# Patient Record
Sex: Female | Born: 1965
Health system: Southern US, Community
[De-identification: ages and names within clinical notes are randomized; demographics above are authoritative.]

## PROBLEM LIST (undated history)

## (undated) DIAGNOSIS — M1990 Unspecified osteoarthritis, unspecified site: Secondary | ICD-10-CM

## (undated) DIAGNOSIS — R011 Cardiac murmur, unspecified: Secondary | ICD-10-CM

## (undated) DIAGNOSIS — J45909 Unspecified asthma, uncomplicated: Secondary | ICD-10-CM

## (undated) DIAGNOSIS — D649 Anemia, unspecified: Secondary | ICD-10-CM

## (undated) DIAGNOSIS — F419 Anxiety disorder, unspecified: Secondary | ICD-10-CM

## (undated) DIAGNOSIS — IMO0001 Reserved for inherently not codable concepts without codable children: Secondary | ICD-10-CM

## (undated) DIAGNOSIS — I10 Essential (primary) hypertension: Secondary | ICD-10-CM

## (undated) DIAGNOSIS — O009 Unspecified ectopic pregnancy without intrauterine pregnancy: Secondary | ICD-10-CM

## (undated) DIAGNOSIS — D259 Leiomyoma of uterus, unspecified: Secondary | ICD-10-CM

## (undated) DIAGNOSIS — J189 Pneumonia, unspecified organism: Secondary | ICD-10-CM

## (undated) HISTORY — DX: Unspecified ectopic pregnancy without intrauterine pregnancy: O00.90

## (undated) HISTORY — DX: Cardiac murmur, unspecified: R01.1

## (undated) HISTORY — DX: Unspecified asthma, uncomplicated: J45.909

## (undated) HISTORY — PX: OOPHORECTOMY: SHX86

## (undated) HISTORY — PX: TUBAL LIGATION: SHX77

---

## 2009-06-05 ENCOUNTER — Emergency Department (HOSPITAL_COMMUNITY): Admission: EM | Admit: 2009-06-05 | Discharge: 2009-06-05 | Payer: Self-pay | Admitting: Emergency Medicine

## 2014-03-15 ENCOUNTER — Ambulatory Visit: Payer: Self-pay | Admitting: Gynecology

## 2014-03-25 ENCOUNTER — Emergency Department (HOSPITAL_COMMUNITY)
Admission: EM | Admit: 2014-03-25 | Discharge: 2014-03-26 | Disposition: A | Discharge status: Home / Self Care | Payer: 59 | Attending: Emergency Medicine | Admitting: Emergency Medicine

## 2014-03-25 ENCOUNTER — Encounter (HOSPITAL_COMMUNITY): Payer: Self-pay | Admitting: Emergency Medicine

## 2014-03-25 ENCOUNTER — Emergency Department (HOSPITAL_COMMUNITY): Payer: 59

## 2014-03-25 DIAGNOSIS — J209 Acute bronchitis, unspecified: Secondary | ICD-10-CM | POA: Insufficient documentation

## 2014-03-25 DIAGNOSIS — R112 Nausea with vomiting, unspecified: Secondary | ICD-10-CM | POA: Insufficient documentation

## 2014-03-25 DIAGNOSIS — Z8701 Personal history of pneumonia (recurrent): Secondary | ICD-10-CM | POA: Insufficient documentation

## 2014-03-25 DIAGNOSIS — J40 Bronchitis, not specified as acute or chronic: Secondary | ICD-10-CM

## 2014-03-25 LAB — CBC WITH DIFFERENTIAL/PLATELET
Basophils Absolute: 0 10*3/uL (ref 0.0–0.1)
Basophils Relative: 0 % (ref 0–1)
Eosinophils Absolute: 0.1 10*3/uL (ref 0.0–0.7)
Eosinophils Relative: 1 % (ref 0–5)
HCT: 35.8 % — ABNORMAL LOW (ref 36.0–46.0)
Hemoglobin: 12 g/dL (ref 12.0–15.0)
LYMPHS ABS: 2.5 10*3/uL (ref 0.7–4.0)
Lymphocytes Relative: 27 % (ref 12–46)
MCH: 26.8 pg (ref 26.0–34.0)
MCHC: 33.5 g/dL (ref 30.0–36.0)
MCV: 80.1 fL (ref 78.0–100.0)
Monocytes Absolute: 0.9 10*3/uL (ref 0.1–1.0)
Monocytes Relative: 9 % (ref 3–12)
NEUTROS ABS: 5.7 10*3/uL (ref 1.7–7.7)
NEUTROS PCT: 63 % (ref 43–77)
Platelets: 214 10*3/uL (ref 150–400)
RBC: 4.47 MIL/uL (ref 3.87–5.11)
RDW: 15 % (ref 11.5–15.5)
WBC: 9.2 10*3/uL (ref 4.0–10.5)

## 2014-03-25 LAB — COMPREHENSIVE METABOLIC PANEL
ALBUMIN: 3.9 g/dL (ref 3.5–5.2)
ALK PHOS: 87 U/L (ref 39–117)
ALT: 31 U/L (ref 0–35)
AST: 29 U/L (ref 0–37)
BUN: 10 mg/dL (ref 6–23)
CO2: 24 mEq/L (ref 19–32)
Calcium: 9 mg/dL (ref 8.4–10.5)
Chloride: 104 mEq/L (ref 96–112)
Creatinine, Ser: 0.63 mg/dL (ref 0.50–1.10)
GFR calc Af Amer: >90 mL/min (ref >90)
GFR calc non Af Amer: >90 mL/min (ref >90)
GLUCOSE: 101 mg/dL — AB (ref 70–99)
POTASSIUM: 3.5 meq/L — AB (ref 3.7–5.3)
SODIUM: 141 meq/L (ref 137–147)
Total Bilirubin: <0.2 mg/dL — ABNORMAL LOW (ref 0.3–1.2)
Total Protein: 7.9 g/dL (ref 6.0–8.3)

## 2014-03-25 LAB — I-STAT TROPONIN, ED: Troponin i, poc: 0.08 ng/mL (ref 0.00–0.08)

## 2014-03-25 MED ORDER — IPRATROPIUM-ALBUTEROL 0.5-2.5 (3) MG/3ML IN SOLN
3.0000 mL | Freq: Once | RESPIRATORY_TRACT | Status: AC
  Administered 2014-03-25: 3 mL via RESPIRATORY_TRACT
  Filled 2014-03-25: qty 3

## 2014-03-25 NOTE — ED Provider Notes (Signed)
CSN: [id number]     Arrival date & time 03/25/14  1835 History   First MD Initiated Contact with Patient 03/25/14 2301     Chief Complaint  Patient presents with  . Shortness of Breath  . Chest Pain     (Consider location/radiation/quality/duration/timing/severity/associated sxs/prior Treatment) Patient is a 48 y.o. female presenting with shortness of breath and chest pain. The history is provided by the patient.  Shortness of Breath Associated symptoms: chest pain   Chest Pain Associated symptoms: shortness of breath   She has been sick for about the last 2 weeks. She saw a physician was diagnosed with pneumonia and sinusitis and was placed on amoxicillin. She completed 10 days of amoxicillin but is not feeling any better. She continues to have a cough productive of white sputum. There is nasal congestion eye and then rhinorrhea is occasionally streaked with blood. She denies fever, chills, sweats. She does have chest pain which is worse when she breathes she rates the pain at 6/10. There is some difficulty breathing. She has had mild nausea and posttussive emesis. She denies arthralgias or myalgias.  History reviewed. No pertinent past medical history. History reviewed. No pertinent past surgical history. History reviewed. No pertinent family history. History  Substance Use Topics  . Smoking status: Never Smoker   . Smokeless tobacco: Not on file  . Alcohol Use: No   OB History   Grav Para Term Preterm Abortions TAB SAB Ect Mult Living                 Review of Systems  Respiratory: Positive for shortness of breath.   Cardiovascular: Positive for chest pain.  All other systems reviewed and are negative.     Allergies  Review of patient's allergies indicates no known allergies.  Home Medications   Prior to Admission medications   Not on File   BP 151/97  Pulse 90  Temp(Src) 98.1 F (36.7 C) (Oral)  Resp 18  SpO2 100%  LMP 02/25/2014 Physical Exam  Nursing note  and vitals reviewed.  48 year old female, resting comfortably and in no acute distress. Vital signs are significant for hypertension with blood pressure 151/97. Oxygen saturation is 100%, which is normal. Head is normocephalic and atraumatic. PERRLA, EOMI. Oropharynx is clear. Neck is nontender and supple without adenopathy or JVD. Back is nontender and there is no CVA tenderness. Lungs have prolonged exhalation phase with a few scattered wheezes. There are no rales or rhonchi. Chest is nontender. Heart has regular rate and rhythm without murmur. Abdomen is soft, flat, nontender without masses or hepatosplenomegaly and peristalsis is normoactive. Extremities have no cyanosis or edema, full range of motion is present. Skin is warm and dry without rash. Neurologic: Mental status is normal, cranial nerves are intact, there are no motor or sensory deficits.  ED Course  Procedures (including critical care time) Labs Review Results for orders placed during the hospital encounter of 03/25/14  CBC WITH DIFFERENTIAL      Result Value Ref Range   WBC 9.2  4.0 - 10.5 K/uL   RBC 4.47  3.87 - 5.11 MIL/uL   Hemoglobin 12.0  12.0 - 15.0 g/dL   HCT 35.8 (*) 36.0 - 46.0 %   MCV 80.1  78.0 - 100.0 fL   MCH 26.8  26.0 - 34.0 pg   MCHC 33.5  30.0 - 36.0 g/dL   RDW 15.0  11.5 - 15.5 %   Platelets 214  150 - 400 K/uL  Neutrophils Relative % 63  43 - 77 %   Neutro Abs 5.7  1.7 - 7.7 K/uL   Lymphocytes Relative 27  12 - 46 %   Lymphs Abs 2.5  0.7 - 4.0 K/uL   Monocytes Relative 9  3 - 12 %   Monocytes Absolute 0.9  0.1 - 1.0 K/uL   Eosinophils Relative 1  0 - 5 %   Eosinophils Absolute 0.1  0.0 - 0.7 K/uL   Basophils Relative 0  0 - 1 %   Basophils Absolute 0.0  0.0 - 0.1 K/uL  COMPREHENSIVE METABOLIC PANEL      Result Value Ref Range   Sodium 141  137 - 147 mEq/L   Potassium 3.5 (*) 3.7 - 5.3 mEq/L   Chloride 104  96 - 112 mEq/L   CO2 24  19 - 32 mEq/L   Glucose, Bld 101 (*) 70 - 99 mg/dL    BUN 10  6 - 23 mg/dL   Creatinine, Ser 0.63  0.50 - 1.10 mg/dL   Calcium 9.0  8.4 - 10.5 mg/dL   Total Protein 7.9  6.0 - 8.3 g/dL   Albumin 3.9  3.5 - 5.2 g/dL   AST 29  0 - 37 U/L   ALT 31  0 - 35 U/L   Alkaline Phosphatase 87  39 - 117 U/L   Total Bilirubin <0.2 (*) 0.3 - 1.2 mg/dL   GFR calc non Af Amer >90  >90 mL/min   GFR calc Af Amer >90  >90 mL/min  I-STAT TROPOININ, ED      Result Value Ref Range   Troponin i, poc 0.08  0.00 - 0.08 ng/mL   Comment 3            Imaging Review No results found. Images viewed by me.   EKG Interpretation   Date/Time:  Monday Mar 25 2014 22:34:06 EDT Ventricular Rate:  92 PR Interval:  166 QRS Duration: 84 QT Interval:  377 QTC Calculation: 466 R Axis:   77 Text Interpretation:  Sinus rhythm Probable left ventricular hypertrophy  No old tracing to compare Confirmed by [facility]  MD, Ferman Basilio ([id number]) on  03/25/2014 11:09:57 PM      MDM   Final diagnoses:  Bronchitis    Respiratory tract infection which has failed to respond to amoxicillin. Chest x-ray does appear to show a localized infiltrate in the retrocardiac area. She clearly has some bronchospasm and will be given albuterol with ipratropium.  She feels much better after above noted treatment. On reexam, lungs are completely clear. She is discharged with prescriptions for prednisone, levofloxacin, and albuterol inhaler. Blood pressures to be rechecked as an outpatient in the next several days and she is to followup with her PCP in one week.  Delora Fuel, MD 65/46/50 [id number]

## 2014-03-25 NOTE — ED Notes (Signed)
Pt reports that she was recently dx with PNA and sinus infection. States that she was placed on medication but is still feeling bad. Reports increased sinus pressure. States that she is having trouble at night sleeping due to the SOB.

## 2014-03-26 MED ORDER — PREDNISONE 50 MG PO TABS
50.0000 mg | ORAL_TABLET | Freq: Every day | ORAL | Status: DC
Start: 2014-03-26 — End: 2015-05-09

## 2014-03-26 MED ORDER — PREDNISONE 20 MG PO TABS
60.0000 mg | ORAL_TABLET | Freq: Once | ORAL | Status: AC
  Administered 2014-03-26: 60 mg via ORAL
  Filled 2014-03-26: qty 3

## 2014-03-26 MED ORDER — LEVOFLOXACIN 500 MG PO TABS
500.0000 mg | ORAL_TABLET | Freq: Every day | ORAL | Status: DC
Start: 2014-03-26 — End: 2015-05-09

## 2014-03-26 MED ORDER — ALBUTEROL SULFATE HFA 108 (90 BASE) MCG/ACT IN AERS: 2.0000 | INHALATION_SPRAY | RESPIRATORY_TRACT | Status: DC | PRN

## 2014-03-26 NOTE — ED Notes (Signed)
Pt ambulated at discharge with slow steady gait, no acute distress, verbalizing no complaints at this time. Pt and her husband report they understand the discharge instructions.

## 2014-03-26 NOTE — Discharge Instructions (Signed)
Su presin arterial era alta hoy. Usted necesita tener lo [employer] .   Bronquitis (Bronchitis) La bronquitis es una inflamacin de las vas respiratorias que se extienden desde la trquea [facility] pulmones (bronquios). A menudo, la inflamacin produce la formacin de mucosidad, lo que genera tos. Si la inflamacin es grave, puede provocar falta de aire. CAUSAS  Las causas de la bronquitis pueden ser:   Infecciones virales.  Bacterias.  Humo del cigarrillo.  Alrgenos, contaminantes y otros irritantes. [place] sntoma ms habitual de la bronquitis es la tos frecuente con mucosidad. Otros sntomas son:  Cristy Hilts.  Dolores [employer] cuerpo.  Congestin en el pecho.  Escalofros.  Falta de aire.  Dolor de Investment banker, operational. DIAGNSTICO  La bronquitis en general se diagnostica con la historia clnica y un examen fsico. En algunos casos se indican otros estudios, como radiografas, para Clinical research associate.  TRATAMIENTO  Tal vez deba evitar el contacto con la causa del problema (por ejemplo, el cigarrillo). En algunos casos es Surveyor, quantity. Estos pueden ser:  Antibiticos. Tal vez se los receten si la causa de la bronquitis es una bacteria.  Antitusivos. Tal vez se los receten para [employer] sntomas de la tos.  Medicamentos inhalados. Tal vez se los receten para Financial controller las vas respiratorias y Museum/gallery exhibitions officer respiracin.  Medicamentos con corticoides. Tal vez se los receten si tiene bronquitis recurrente (crnica). INSTRUCCIONES PARA EL CUIDADO EN EL HOGAR  Descanse lo suficiente.  Beba lquidos en abundancia para mantener la orina de color claro o amarillo plido (excepto que padezca una enfermedad que requiera la restriccin de lquidos). Tome mucho lquido para Radiation protection practitioner las secreciones y Tree surgeon.  Tome solo medicamentos de venta libre o recetados, segn las [place] los  antibiticos exclusivamente segn las indicaciones. Finalice la prescripcin completa, aunque se sienta mejor.  Evite el humo de segunda mano, los qumicos irritantes y los vapores fuertes. Estos agentes empeoran la bronquitis. Si es fumador, abandone el hbito. Considere el uso de goma de Higher education careers adviser o la aplicacin de parches en la piel que contengan nicotina para aliviar los sntomas de abstinencia. Si deja de fumar, sus pulmones se curarn ms rpido.  Ponga un humidificador de vapor fro en la habitacin por la noche para humedecer el aire. Puede ayudarlo a aflojar la mucosidad. Cambie el agua del humidificador a diario. Tambin puede abrir el agua caliente de la ducha y sentarse en el bao con la puerta cerrada durante 5a67minutos.  Concurra a las consultas de control con su mdico segn las indicaciones.  Lvese las manos con frecuencia para evitar contagiarse bronquitis nuevamente y para no extender la infeccin a Producer, television/film/video. SOLICITE ATENCIN MDICA SI: Los sntomas no mejoran despus de 1 semana de tratamiento.  SOLICITE ATENCIN MDICA DE INMEDIATO SI:  La fiebre aumenta.  Tiene escalofros.  Siente dolor en el pecho.  Le empeora la falta el aire.  La flema tiene [place].  Se desmaya.  Tiene vahdos.  Sufre un dolor intenso de [place].  Vomita repetidas veces. ASEGRESE DE QUE:   Comprende estas instrucciones.  Controlar su afeccin.  Recibir ayuda de inmediato si no mejora o si empeora. Document Released: 10/25/2005 Document Revised: 08/15/2013 [facility] Patient Information 2014 [place], [place].  Albuterol inhalation aerosol Qu es este medicamento? El ALBUTEROL es un broncodilatador. Ayuda a abrir las vas areas a los pulmones y Field seismologist respirar ms fcilmente. Este medicamento es utilizado para tratar  y prevenir el broncoespasmo. Este medicamento puede ser utilizado para otros usos; si tiene alguna pregunta consulte con su proveedor de atencin mdica o  con su farmacutico. MARCAS COMERCIALES DISPONIBLES: Proair HFA, Proventil HFA, Proventil, Respirol , Ventolin HFA, Ventolin Qu le debo informar a mi profesional de la salud antes de tomar este medicamento? Necesita saber si usted presenta alguno de los siguientes problemas o situaciones: -diabetes -enfermedad cardiaca o pulso cardiaco irregular -alta presin sangunea -feocromocitoma -convulsiones -enfermedad tiroidea -una reaccin alrgica o inusual al albuterol, al levalbuterol, a los sulfitos, a otros medicamentos, alimentos, colorantes o conservadores -si est embarazada o buscando quedar embarazada -si est amamantando a un beb Cmo debo utilizar este medicamento? Este medicamento es para inhalacin por va oral. Siga las instrucciones de la etiqueta del [place]. Utilcelo a intervalos regulares. No utilice su medicamento con una frecuencia mayor a la indicada. Asegrese de que est utilizando su [place]. Si tiene [employer], comunquese con su mdico o su proveedor de Geophysical data processor. Hable con su pediatra para informarse acerca del uso de este medicamento en nios. Puede requerir atencin especial. Sobredosis: Pngase en contacto inmediatamente con un centro toxicolgico o una sala de urgencia si usted cree que haya tomado demasiado medicamento. ATENCIN: [employer] es solo para usted. No comparta este medicamento con nadie. Qu sucede si me olvido de una dosis? Si olvida una dosis, sela lo antes posible. Si es casi la hora de su dosis siguiente, aplique slo esa dosis. No use dosis dobles o adicionales. Qu puede interactuar con este medicamento? -antiinfecciosos como cloroquina y pentamidina -cafena -cisapride -diurticos -medicamentos para resfros -medicamentos para tratar la depresin o trastornos psicticos o emocionales -medicamentos para bajar de peso incluyendo algunos productos a base de hierbas -metadona -ciertos antibiticos  Estate manager/land agent, eritromicina, levofloxacino y linezolid -ciertos medicamentos cardiacos -hormonas esteroideas, tales como dexametasona, cortisona, hidrocortisona -teofilina -hormonas tiroideas Puede ser que esta lista no menciona todas las posibles interacciones. Informe a su profesional de KB Home	[place] de [employer] productos a base de hierbas, medicamentos de [place] o suplementos nutritivos que est tomando. Si usted fuma, consume bebidas alcohlicas o si utiliza drogas ilegales, indqueselo tambin a su profesional de KB Home	[place]. Algunas sustancias pueden interactuar con su medicamento. A qu debo estar atento al usar Coca-Cola? Informe a su mdico o a su profesional de la salud si sus sntomas no mejoran. No utilice albuterol adicional. Si su asma o bronquitis empeora mientras est recibiendo Coca-Cola, comunquese inmediatamente con su mdico. Si el medicamento le seca la boca trate de Engineer, manufacturing systems chicle sin azcar o chupar caramelos duros. Bebe agua como le haya indicado. Qu efectos secundarios puedo tener al [employer] este medicamento? Efectos secundarios que debe informar a su mdico o a Barrister's clerk de la salud tan pronto como sea posible: -Chief of Staff como erupcin cutnea, picazn o urticarias, hinchazn de la cara, labios o lengua -problemas respiratorios -dolor en el pecho -sensacin de desmayos o mareos, cadas -alta presin sangunea -pulso cardiaco irregular -fiebre -calambres o debilidad muscular -dolor, hormigueo, entumecimiento de las manos o pies -vmito Efectos secundarios que, por lo general, no requieren atencin mdica (debe informarlos a su mdico o a su profesional de la salud si persisten o si son molestos): -tos -dificultad para conciliar el sueo -dolor de cabeza -nerviosismo, temblores -Higher education careers adviser -congestin nasal o goteo de la Lawyer -irrtacin de garganta -sabor inusual Puede ser que esta lista no menciona todos los  posibles efectos secundarios. Comunquese a su mdico por  asesoramiento mdico [employer]. Usted puede informar los efectos secundarios a la FDA por telfono al 1-800-FDA-1088. Dnde debo guardar mi medicina? Mantngala fuera del alcance de los nios. Guarde a [employer] 15 y 50 grados C (59 y 32 grados F). El contenido se encuentra bajo presin y puede explotar si lo expone al calor o al fuego. No lo congele. El fro [employer] efectividad del [place]. Deseche todo el medicamento que no haya utilizado, despus de la fecha de vencimiento. Debe desechar los inhaladores despus de que se han usado la cantidad de bocanadas indicado en la etiqueta o por la fecha de vencimiento; el que llegue primero. Debe desechar Ventolin HFA despus de 12 meses de sacar de la bolsa de aluminio. Revise las instrucciones que vienen con su medicamento. ATENCIN: Este folleto es un resumen. Puede ser que no cubra toda la posible informacin. Si usted tiene preguntas acerca de esta medicina, consulte con su mdico, su farmacutico o su profesional de Technical sales engineer.  2014, Elsevier/Gold Standard. (2013-05-22 15:41:57)  Levofloxacin tablets Qu es este medicamento? El LEVOFLOXACINO es un antibitico quinolnico. Se utiliza en el tratamiento de ciertos tipos de infecciones bacterianas. No es efectivo para resfros, gripe u otras infecciones de origen viral. Este medicamento puede ser utilizado para otros usos; si tiene alguna pregunta consulte con su proveedor de atencin mdica o con su farmacutico. MARCAS COMERCIALES DISPONIBLES: Levaquin Leva-Pak, Levaquin Qu le debo informar a mi profesional de la salud antes de tomar este medicamento? Necesita saber si usted presenta alguno de los siguientes problemas o situaciones: -enfermedad cerebral -latido cardaco irregular -enfermedad renal -trastorno de convulsiones -una reaccin alrgica o inusual al levofloxacino, a los antibiticos, a  otros medicamentos, alimentos, colorantes o conservantes -si est embarazada o buscando quedar embarazada -si est amamantando a un beb Cmo debo utilizar este medicamento? Tome este medicamento por va oral con un vaso lleno de agua. Siga las instrucciones de la etiqueta del [place]. Este medicamento se puede tomar con o sin alimentos. Tome sus dosis a intervalos regulares. No tome su medicamento con una frecuencia mayor a la indicada. No omita ninguna dosis o suspenda el uso de su medicamento antes de lo indicado aun si se siente mejor. No deje de tomarlo excepto si as lo indica su mdico. Su farmacutico le dar una [place] del medicamento especial con cada receta y relleno. Asegrese de leer esta informacin cada vez cuidadosamente. Hable con su pediatra para informarse acerca del uso de este medicamento en nios. Aunque este medicamento ha sido recetado a nios tan menores como de 6 meses de edad para condiciones selectivas, las precauciones se aplican. Sobredosis: Pngase en contacto inmediatamente con un centro toxicolgico o una sala de urgencia si usted cree que haya tomado demasiado medicamento. ATENCIN: [employer] es solo para usted. No comparta este medicamento con nadie. Qu sucede si me olvido de una dosis? Si olvida una dosis, tmela en cuanto se acuerde. Si es casi la hora de la prxima dosis, tome slo esa dosis. No tome dosis adicionales o dobles. Qu puede interactuar con este medicamento? No tome esta medicina con ninguno de los siguientes medicamentos: -trixido de arsnico -cloroquina -droperidol -medicamentos para el ritmo cardiaco irregular, tales como amiodarona, disopiramida, dofetilida, flecainida, quinidina, procainamida, sotalol -ciertos medicamentos para la depresin o los problemas mentales, tales como fenotiazina, pimozida o ziprasidona Esta medicina tambin puede interactuar con los siguientes  medicamentos: -amoxapina -anticidos -cisapride -productos lcteos -polvo o tabletas tamponadas de didanosina (ddI) -haloperidol -multivitaminas -los  AINE, medicamentos para el dolor o inflamacin, como ibuprofeno o naproxeno -productos retinoides, tales como tretinona o isotretinona -risperidona -otros antibiticos, tales como claritromicina o eritromicina -sucralfato -teofilina -warfarina Puede ser que esta lista no menciona todas las posibles interacciones. Informe a su profesional de KB Home	[place] de [employer] productos a base de hierbas, medicamentos de [place] o suplementos nutritivos que est tomando. Si usted fuma, consume bebidas alcohlicas o si utiliza drogas ilegales, indqueselo tambin a su profesional de KB Home	[place]. Algunas sustancias pueden interactuar con su medicamento. A qu debo estar atento al usar Coca-Cola? Consulte a su mdico o a su profesional de la salud si sus sntomas no mejoran o si empeoran. Beba varios vasos de agua por da y Tax adviser consumo de bebidas que contengan cafena. No debe deshidratarse mientras est tomando este medicamento. Puede experimentar somnolencia o mareos. No conduzca ni utilice maquinaria, ni haga nada que Associate Professor en estado de alerta hasta que sepa cmo le afecta este medicamento. No se siente ni se ponga de pie con rapidez, especialmente si es un paciente de edad avanzada. Esto reduce el riesgo de mareos o [employer]. Este medicamento puede aumentar la sensibilidad al sol. Mantngase fuera de Administrator. Si no lo puede evitar, utilice ropa protectora y crema de Photographer. No utilice lmparas solares, camas solares ni cabinas solares. Comunquese con su mdico si recibe una quemadura de sol. Si es diabtico, controle atentamente el nivel de Dispensing optician. Si tiene una reaccin inusual, deje de tomar este medicamento y consulte a su mdico inmediatamente. No trate la diarrea con productos de [employer].  Comunquese con su mdico si tiene diarrea que dura ms de 2 das o si es severa y [place]. Evite las preparaciones de anticido, de calcio, de hierro o de cinc por lo menos 2 horas antes o 2 horas despus de Systems developer. Qu efectos secundarios puedo tener al [employer] este medicamento? Efectos secundarios que debe informar a su mdico o a Barrister's clerk de la salud tan pronto como sea posible: -Chief of Staff como erupcin cutnea o urticarias, hinchazn de la cara, labios o lengua -cambios en la visin -confusin, pesadillas o alucinaciones -dificultad al respirar -pulso cardiaco irregular, dolor en el pecho -dolores musculares, articulares o de tendones -dolor o dificultad para orinar -dolor de cabeza persistente con o sin visin borrosa -enrojecimiento, formacin de ampollas, descamacin o aflojamiento de la piel, inclusive dentro de la boca -convulsiones -dolor, entumecimiento, hormigueo o debilidad inusual -irritacin o flujo vaginal Efectos secundarios que, por lo general, no requieren atencin mdica (debe informarlos a su mdico o a su profesional de la salud si persisten o si son molestos): -diarrea -boca seca -dolor de cabeza -nuseas, malestar estomacal -dificultad para conciliar el sueo Puede ser que esta lista no menciona todos los posibles efectos secundarios. Comunquese a su mdico por asesoramiento mdico [employer]. Usted puede informar los efectos secundarios a la FDA por telfono al 1-800-FDA-1088. Dnde debo guardar mi medicina? Mantngala fuera del alcance de los nios. Gurdela a [employer], entre 15 y 80 grados C (58 y 58 grados F). Mantngala en un envase bien cerrado. Deseche todo el medicamento que no haya utilizado, despus de la fecha de vencimiento. ATENCIN: Este folleto es un resumen. Puede ser que no cubra toda la posible informacin. Si usted tiene preguntas acerca de esta medicina, consulte con su mdico, su  farmacutico o su profesional de Technical sales engineer.  2014, Elsevier/Gold Standard. (2011-12-29  16:07:39)  Prednisone tablets Qu es este medicamento? La PREDNISONA es un corticosteroide. Se utiliza para tratar la inflamacin de la piel, articulaciones, pulmones y otros rganos. Condiciones comunes tratadas incluyen asma, alergias y artritis. Tambin se [place] para [employer], tales como trastornos sanguneos o enfermedades de la glndula suprarrenal. Este medicamento puede ser utilizado para otros usos; si tiene alguna pregunta consulte con su proveedor de atencin mdica o con su farmacutico. MARCAS COMERCIALES DISPONIBLES: Deltasone, Predone, Sterapred DS, Sterapred Qu le debo informar a mi profesional de la salud antes de tomar este medicamento? Necesita saber si usted presenta alguno de los siguientes problemas o situaciones: -Sndrome de [place] -diabetes -glaucoma -enfermedad cardiaca -alta presin sangunea -infeccin (especialmente infecciones virales, como varicela o herpes) -enfermedad renal -enfermedad heptica -problemas mentales -miastenia gravis -osteoporosis -convulsiones -problemas estomacales o intestinales -enfermedad tiroidea -una reaccin alrgica o inusual a la lactosa, a la prednisona, a otros medicamentos, alimentos, colorantes o conservantes -si est embarazada o buscando quedar embarazada -si est amamantando a un beb Cmo debo utilizar este medicamento? Tome este medicamento por va oral con un vaso de agua. Siga las instrucciones de la etiqueta del [place]. Tome este medicamento con alimentos. Si slo toma este medicamento una vez al da, tmelo por la [place]. No tome ms medicamento que lo indicado. No deje de tomar este medicamento de manera abrupta debido a que podr Engineer, materials reaccin severa. Su mdico le indicar la cantidad de Event organiser. Si su mdico desea que [employer], la dosis ser reducida gradualmente  para Research officer, political party secundarios. Hable con su pediatra para informarse acerca del uso de este medicamento en nios. Puede requerir atencin especial. Sobredosis: Pngase en contacto inmediatamente con un centro toxicolgico o una sala de urgencia si usted cree que haya tomado demasiado medicamento. ATENCIN: [employer] es solo para usted. No comparta este medicamento con nadie. Qu sucede si me olvido de una dosis? Si olvida una dosis, tmela tan pronto como sea posible. Si es casi la hora de su dosis siguiente, consulte a su mdico o a su profesional de Technical sales engineer. Usted puede necesitar saltar una dosis o tomar una dosis adicional. No tome dosis dobles o adicionales sin asesoramiento. Qu puede interactuar con este medicamento? No tome esta medicina con ninguno de los siguientes medicamentos: -metirapona -mifepristona Esta medicina tambin puede interactuar con los siguientes medicamentos: -aminoglutetimida -anfotericina B -aspirina o medicamentos tipo aspirina -barbitricos -ciertos medicamentos para la diabetes, tales como glipizida o gliburida -colestiramina -inhibidores de colinesterasa -ciclosporina -digoxina -diurticos -efedrina -hormonas femeninas, como estrgenos, progestinas o pldoras anticonceptivas -isoniazida -quetoconazol -los [employer], medicamentos para el dolor o inflamacin, como ibuprofeno o naproxeno -fenitona -rifampicina -toxoide -vacunas -warfarina Puede ser que esta lista no menciona todas las posibles interacciones. Informe a su profesional de KB Home	[place] de [employer] productos a base de hierbas, medicamentos de [place] o suplementos nutritivos que est tomando. Si usted fuma, consume bebidas alcohlicas o si utiliza drogas ilegales, indqueselo tambin a su profesional de KB Home	[place]. Algunas sustancias pueden interactuar con su medicamento. A qu debo estar atento al usar Coca-Cola? Visite a su mdico o a su profesional de la salud para chequear  su evolucin peridicamente. Si toma este medicamento durante un perodo prolongado, lleve consigo una tarjeta de identificacin con su nombre y direccin, el tipo y la dosis del [place], y Academic librarian y la direccin de su mdico. [place] medicamento puede aumentar su riesgo de contraer una infeccin. Informe a su mdico o a su  profesional de la salud si est en contacto con personas con sarampin o varicela, o si desarrolla llagas o ampollas que no se curan bien. Si va a someterse a una operacin, informe a su mdico o a su profesional de la salud que ha tomado este [employer] ltimos doce meses. Consulte a su mdico o a su profesional de la salud acerca de su dieta. Tal vez tendr que reducir la cantidad de sal que consume. Este medicamento puede afectar su nivel de Dispensing optician. Si tiene diabetes, consulte a su mdico o a su profesional de la salud antes de cambiar su dieta o la dosis de su medicamento para la diabetes. Qu efectos secundarios puedo tener al [employer] este medicamento? Efectos secundarios que debe informar a su mdico o a Barrister's clerk de la salud tan pronto como sea posible: -Chief of Staff como erupcin cutnea, picazn o urticarias, hinchazn de la cara, labios o lengua -cambios de emociones o humor -cambios en la visin -humor deprimido -dolor ocular -fiebre o escalofros, tos, dolor de garganta, dolor o dificultad para orinar -aumento de sed -hinchazn de tobillos, pies Efectos secundarios que, por lo general, no requieren atencin mdica (debe informarlos a su mdico o a su profesional de la salud si persisten o si son molestos): -confusin, excitacin, inquietud -dolor de cabeza -nuseas, vmito -problemas en la piel, acn, piel delgada y brillante -dificultad para conciliar el sueo -aumento de peso Puede ser que esta lista no menciona todos los posibles efectos secundarios. Comunquese a su mdico por asesoramiento mdico [employer]. Usted puede informar los efectos secundarios a la FDA por telfono al 1-800-FDA-1088. Dnde debo guardar mi medicina? Mantngala fuera del alcance de los nios. Gurdela a [employer], entre 15 y 76 grados C (59 y 36 grados F). Protejla de la luz. Mantenga el envase bien cerrado. Deseche los medicamentos que no haya utilizado, despus de la fecha de vencimiento. ATENCIN: Este folleto es un resumen. Puede ser que no cubra toda la posible informacin. Si usted tiene preguntas acerca de esta medicina, consulte con su mdico, su farmacutico o su profesional de Technical sales engineer.  2014, Elsevier/Gold Standard. (2011-07-14 16:57:34)

## 2014-04-12 ENCOUNTER — Other Ambulatory Visit (HOSPITAL_COMMUNITY)
Admission: RE | Admit: 2014-04-12 | Discharge: 2014-04-12 | Disposition: A | Discharge status: Home / Self Care | Payer: 59 | Source: Ambulatory Visit | Attending: Gynecology | Admitting: Gynecology

## 2014-04-12 ENCOUNTER — Ambulatory Visit (INDEPENDENT_AMBULATORY_CARE_PROVIDER_SITE_OTHER): Payer: 59 | Admitting: Gynecology

## 2014-04-12 ENCOUNTER — Encounter: Payer: Self-pay | Admitting: Gynecology

## 2014-04-12 VITALS — BP 154/90 | Ht 63.0 in | Wt 181.0 lb

## 2014-04-12 DIAGNOSIS — Z01419 Encounter for gynecological examination (general) (routine) without abnormal findings: Secondary | ICD-10-CM | POA: Insufficient documentation

## 2014-04-12 DIAGNOSIS — I10 Essential (primary) hypertension: Secondary | ICD-10-CM

## 2014-04-12 DIAGNOSIS — Z1151 Encounter for screening for human papillomavirus (HPV): Secondary | ICD-10-CM | POA: Insufficient documentation

## 2014-04-12 DIAGNOSIS — N951 Menopausal and female climacteric states: Secondary | ICD-10-CM | POA: Insufficient documentation

## 2014-04-12 DIAGNOSIS — N946 Dysmenorrhea, unspecified: Secondary | ICD-10-CM

## 2014-04-12 DIAGNOSIS — E663 Overweight: Secondary | ICD-10-CM

## 2014-04-12 DIAGNOSIS — N915 Oligomenorrhea, unspecified: Secondary | ICD-10-CM

## 2014-04-12 NOTE — Progress Notes (Signed)
Stephanie Gibson 07-Jul-1966 [id number]   History:    48 y.o.  for annual gyn exam he is an inpatient to the practice. Patient is from [place] and had her last gynecological exam in 2013. Patient denies any abnormal Pap smears in the past. She is complaining of vasomotor symptoms consisting of hot flashes, irritability, mood swing and decreased libido. She states her cycles are irregular they can be from 6-8 weeks apart. Patient with no prior mammogram. Patient with prior history of tubal sterilization procedure as well as right salpingo-oophorectomy as a result of a ruptured ectopic she's also had 1 normal vaginal delivery and one cesarean section and one spontaneous miscarriage. But maintenance of this year she was treated for bronchitis and had some blood work which consisted of comprehensive metabolic panel and CBC which were normal.  Her blood pressure was 154/90 and on repeat 156/88  Past medical history,surgical history, family history and social history were all reviewed and documented in the EPIC chart.  Gynecologic History Patient's last menstrual period was 02/25/2014. Contraception: tubal ligation Last Pap: 2013. Results were: Patient reports that was normal done in [place] Last mammogram: No prior study. Results were: No prior study  Obstetric History OB History  Gravida Para Term Preterm AB SAB TAB Ectopic Multiple Living  4 2   1   1  2     # Outcome Date GA Lbr Len/2nd Weight Sex Delivery Anes PTL Lv  4 ECT           3 PAR           2 PAR           1 GRA                ROS: A ROS was performed and pertinent positives and negatives are included in the history.  GENERAL: No fevers or chills. HEENT: No change in vision, no earache, sore throat or sinus congestion. NECK: No pain or stiffness. CARDIOVASCULAR: No chest pain or pressure. No palpitations. PULMONARY: No shortness of breath, cough or wheeze. GASTROINTESTINAL: No abdominal pain, nausea, vomiting or diarrhea, melena  or bright red blood per rectum. GENITOURINARY: No urinary frequency, urgency, hesitancy or dysuria. MUSCULOSKELETAL: No joint or muscle pain, no back pain, no recent trauma. DERMATOLOGIC: No rash, no itching, no lesions. ENDOCRINE: No polyuria, polydipsia, no heat or cold intolerance. No recent change in weight. HEMATOLOGICAL: No anemia or easy bruising or bleeding. NEUROLOGIC: No headache, seizures, numbness, tingling or weakness. PSYCHIATRIC: No depression, no loss of interest in normal activity or change in sleep pattern.     Exam: chaperone present  BP 154/90  Ht 5\' 3"  (1.6 m)  Wt 181 lb (82.101 kg)  BMI 32.07 kg/m2  LMP 02/25/2014  Body mass index is 32.07 kg/(m^2).  General appearance : Well developed well nourished female. No acute distress HEENT: Neck supple, trachea midline, no carotid bruits, no thyroidmegaly Lungs: Clear to auscultation, no rhonchi or wheezes, or rib retractions  Heart: Regular rate and rhythm, no murmurs or gallops Breast:Examined in sitting and supine position were symmetrical in appearance, no palpable masses or tenderness,  no skin retraction, no nipple inversion, no nipple discharge, no skin discoloration, no axillary or supraclavicular lymphadenopathy Abdomen: no palpable masses or tenderness, no rebound or guarding Extremities: no edema or skin discoloration or tenderness  Pelvic:  [place], Urethra, Skene Glands: Within normal limits             Vagina: No gross lesions  or discharge  Cervix: No gross lesions or discharge  Uterus  anteverted limited due to abdominal girth and slight vaginismus, normal size, shape and consistency, non-tender and mobile  Adnexa  Without masses or tenderness  Anus and perineum  normal   Rectovaginal  normal sphincter tone without palpated masses or tenderness             Hemoccult not indicated     Assessment/Plan:  48 y.o. female for annual exam who appears to be perimenopausal. Will check an [facility] today along with a  TSH and screening cholesterol. Pap smear with HPV screening was done today. Patient will return back for an ultrasound and to discuss these blood tests in a few weeks. Literature information was provided in Spanish discussing the perimenopause as well as hormonal replacement therapy. She was asked to keep a log of her blood pressure readings twice a day and to bring with her to next office appointment.  Note: This dictation was prepared with  Dragon/digital dictation along withSmart phrase technology. Any transcriptional errors that result from this process are unintentional.   Terrance Mass MD, 4:30 PM 04/12/2014

## 2014-04-12 NOTE — Patient Instructions (Signed)
Terapia de reemplazo hormonal (Hormone Replacement Therapy) En la menopausia, su cuerpo comienza a producir menos estrgeno y Immunologist. Esto provoca que el cuerpo deje de tener perodos [place]. Esto se debe a que el estrgeno y la progesterona controlan sus perodos y su ciclo menstrual. Ardelia Mems falta de estrgeno puede causar sntomas tales como:  Social research officer, government.  Sequedad vaginal  Piel seca.  Prdida del deseo sexual.  Riesgo de prdida de hueso (osteoporosis). Cuando esto ocurre, puede elegir realizar Ardelia Mems terapia hormonal para volver a Clinical research associate estrgeno perdido [employer]. Cuando slo se introduce esta hormona, el procedimiento se conoce normalmente como TRE (terapia de reemplazo de [place]). Cuando la hormona progestina se combina con el estrgeno, el procedimiento se conoce normalmente como TH (terapia hormonal). Esto es lo que previamente se conoca como terapia de reemplazo hormonal (TRH). El profesional que le asiste le ayudar a tomar una decisin acerca de lo que resulte lo mejor para usted. La decisin de realizar una TRH cambia a menudo debido a que se Risk manager. Muchos estudios no ponen de acuerdo con respecto a los beneficios de Optometrist una terapia de reemplazo hormonal.  BENEFICIOS PROBABLES DE LA TRH QUE INCLUYEN PROTECCIN CONTRA:  Golpes de calor - Un golpe de calor es la sensacin repentina de calor sobre la cara y el cuerpo. La piel enrojece, como al sonrojarse. Estn asociados con la transpiracin y los trastornos del sueo. Las mujeres que atraviesan la menopausia pueden tener golpes de calor unas pocas veces en el mes o varios al da; esto depende de la mujer.  Osteoporosis (prdida de hueso)  El estrgeno ayuda a protegerse contra la prdida de [place]. Luego de la [place], los huesos de una mujer pierden calcio y se vuelven frgiles y Publishing rights manager. Como resultado, es ms probable que el hueso se [place]. Los que resultan afectados con  mayor frecuencia son los de la cadera, la [place] y la columna vertebral. La terapia hormonal puede ayudar a retardar la prdida de hueso luego de la menopausia. Realizar ejercicios con peso y tomar calcio con vitamina D tambin puede ayudar a prevenir la prdida de [place]. Existen medicamentos que puede prescribir el profesional que la asiste para ayudar a prevenir la osteoporosis.  Sequedad vaginal  La prdida de estrgeno produce cambios en la vagina. El recubrimiento de la misma puede volverse fino y Education officer, museum. Estos cambios pueden causar dolor y [place]. La sequedad tambin puede producir una infeccin. Puede ocasionarle ardor y [place].  Las infecciones en las vas urinarias son ms comunes luego de la menopausia debido a la falta de Oceanographer.  Otros beneficios posibles del estrgeno incluyen un cambio positivo en el humor y en la memoria de corto plazo en las mujeres. EFECTOS SECUNDARIOS Y RIESGOS  Utilizar estrgeno slo sin progesterona causa que el recubrimiento del tero crezca. Esto aumenta el riesgo de cncer endometrial. El profesional que la asiste deber darle otra hormona llamada progestina, si usted tiene tero.  Las mujeres que realizan una TH combinada (estrgeno y progestina) parecen tener un mayor riesgo de sufrir cncer de mama. El riesgo parece ser [place], [place] aumenta a lo largo del tiempo que se realice la [place].  La terapia combinada tambin hace que el tejido mamario sea levemente ms denso, lo que hace que sea ms difcil leer mamografas (radiografas de mama).  Combinada, la terapia de estrgeno y Immunologist puede realizarse todos los das, en cuyo caso podrn Warehouse manager de [place]. La TH puede realizarse  de manera cclica, en cuyo caso tendr perodos menstruales.  La TH puede aumentar el riesgo de [place], ataque cardaco, cncer de mama y formacin de cogulos en la pierna. TRATAMIENTO  Si decide realizar Ardelia Mems TH y tiene tero,  normalmente se prescribe el uso de estrgeno y progestina.  El profesional que la asiste le ayudar a decidir la mejor forma de [employer].  Lo mejor es Chief Executive Officer dosis posible que pueda ayudarla con sus sntomas y tomarlos durante la menor cantidad de tiempo posible.  La terapia hormonal puede ayudar a Banker de los problemas (sntomas) que afectan a las mujeres durante la menopausia. Antes de tomar una decisin con respecto a la TH, converse con el profesional que la asiste acerca de qu es lo mejor para usted. Mantngase bien informada y sintase cmoda con sus decisiones. INSTRUCCIONES PARA EL CUIDADO DOMICILIARIO:  [place] indicaciones del profesional con respecto a cmo Biomedical scientist.  Hgase controles de [place] regular, e incluya [place] y [place]. SOLICITE ATENCIN MDICA DE INMEDIATO SI PRESENTA:  Hemorragia vaginal anormal.  Dolor o inflamacin en las piernas, falta de aliento o Tourist information centre manager.  Mareos o dolores de [place].  Protuberancias o cambios en sus mamas o axilas.  Pronunciacin inarticulada.  Debilidad o adormecimiento en los brazos o las piernas.  Dolor, ardor o sangrado al [employer].  Dolor abdominal. Document Released: 04/12/2008 Document Revised: 01/17/2012 ExitCare Patient Information 2014 [place]. Perimenopausia (Perimenopause) La perimenopausia es el momento en que su cuerpo comienza a pasar a la menopausia (sin menstruacin durante 12 meses consecutivos). Es un proceso natural. La perimenopausia puede comenzar entre 2 y 69 aos antes de la menopausia y por lo general tiene una duracin de 1 ao ms pasada la menopausia. [employer], los ovarios podran producir un vulo o no. Los ovarios varan su produccin de las hormonas estrgeno y Technical brewer. Esto puede causar perodos menstruales irregulares, dificultad para quedar embarazada, hemorragia vaginal entre perodos y sntomas  incmodos. CAUSAS  Produccin irregular de las hormonas ovricas estrgeno y Immunologist, y no ovular todos los meses.  Otras causas son:  Tumor de la glndula pituitaria.  Enfermedades que [employer] ovarios.  Radioterapia.  Quimioterapia.  Causas desconocidas.  Fumar mucho y abusar del consumo de alcohol puede llevar a que la perimenopausia aparezca antes. SIGNOS Y SNTOMAS   Acaloramiento.  Sudoracin nocturna.  Perodos menstruales irregulares.  Disminucin del deseo sexual.  Sequedad vaginal.  Dolores de cabeza.  Cambios en el estado de nimo.  Depresin.  Problemas de memoria.  Irritabilidad.  Cansancio.  Aumento de [place].  Problemas para quedar embarazada.  Prdida de clulas seas (osteoporosis).  Comienzo de endurecimiento de las arterias (aterosclerosis). DIAGNSTICO  El mdico realizar un diagnstico en funcin de su edad, historial de perodos menstruales y sntomas. Le realizarn un examen fsico para ver si hay algn cambio en su cuerpo, en especial en sus rganos reproductores. Las pruebas hormonales pueden ser o no tiles segn la cantidad de hormonas femeninas que produzca y [employer] produzca. Sin embargo, podrn Microbiologist pruebas hormonales para Statistician. TRATAMIENTO  En algunos casos, no se necesita tratamiento. La decisin acerca de qu tratamiento es necesario durante la perimenopausia deber realizarse en conjunto con su mdico segn cmo estn afectando los sntomas a su estilo de vida. Existen varios tratamientos disponibles, como:  Risk manager cada sntoma individual con medicamentos especficos para ese sntoma.  Algunos medicamentos herbales pueden ayudar en sntomas  especficos.  Psicoterapia.  Terapia grupal. INSTRUCCIONES PARA EL CUIDADO EN EL HOGAR   Controle sus periodos menstruales (cundo ocurren, qu tan abundantes son, cunto tiempo pasa entre perodos, y cunto duran) como tambin sus sntomas y  cundo comenzaron.  Tome slo medicamentos de venta libre o recetados, segn las indicaciones del mdico.  Duerma y descanse.  Haga actividad fsica.  Consuma una dieta que contenga calcio (bueno para los [place]) y productos derivados de la soja (actan como estrgenos).  No fume.  Evite las bebidas alcohlicas.  Tome los suplementos vitamnicos segn las indicaciones del mdico. En ciertos casos, puede ser de [place] tomar vitamina E.  Tome suplementos de calcio y vitamina D para ayudar a Publishing rights manager prdida sea.  En algunos casos la terapia de grupo podr ayudarla.  La acupuntura puede ser de ayuda en ciertos casos. SOLICITE ATENCIN MDICA SI:   Tiene preguntas acerca de sus sntomas.  Necesita ser derivada a un especialista (gineclogo, psiquiatra, o psiclogo). SOLICITE ATENCIN MDICA DE INMEDIATO SI:   Sufre una hemorragia vaginal abundante.  Su perodo menstrual dura ms de 8 das.  Sus perodos son recurrentes cada menos de [address].  Tiene hemorragias durante las [employer].  Est muy deprimido.  Siente dolor al [employer].  Siente dolor de cabeza intenso.  Tiene problemas de visin. Document Released: 10/25/2005 Document Revised: 08/15/2013 [facility] Patient Information 2014 [place], [place].

## 2014-04-13 LAB — FOLLICLE STIMULATING HORMONE: FSH: 13.5 m[IU]/mL

## 2014-04-13 LAB — TSH: TSH: 1.216 u[IU]/mL (ref 0.350–4.500)

## 2014-04-13 LAB — PROLACTIN: Prolactin: 12.4 ng/mL

## 2014-04-16 ENCOUNTER — Other Ambulatory Visit: Payer: Self-pay

## 2014-04-16 DIAGNOSIS — Z1231 Encounter for screening mammogram for malignant neoplasm of breast: Secondary | ICD-10-CM

## 2014-04-16 LAB — CYTOLOGY - PAP

## 2014-04-26 ENCOUNTER — Encounter (INDEPENDENT_AMBULATORY_CARE_PROVIDER_SITE_OTHER): Payer: Self-pay

## 2014-04-26 ENCOUNTER — Ambulatory Visit
Admission: RE | Admit: 2014-04-26 | Discharge: 2014-04-26 | Disposition: A | Discharge status: Home / Self Care | Payer: 59 | Source: Ambulatory Visit

## 2014-04-26 DIAGNOSIS — Z1231 Encounter for screening mammogram for malignant neoplasm of breast: Secondary | ICD-10-CM

## 2014-05-03 ENCOUNTER — Encounter: Payer: Self-pay | Admitting: Gynecology

## 2014-05-03 ENCOUNTER — Ambulatory Visit (INDEPENDENT_AMBULATORY_CARE_PROVIDER_SITE_OTHER): Payer: 59

## 2014-05-03 ENCOUNTER — Ambulatory Visit (INDEPENDENT_AMBULATORY_CARE_PROVIDER_SITE_OTHER): Payer: 59 | Admitting: Gynecology

## 2014-05-03 VITALS — BP 140/70

## 2014-05-03 DIAGNOSIS — D251 Intramural leiomyoma of uterus: Secondary | ICD-10-CM

## 2014-05-03 DIAGNOSIS — N946 Dysmenorrhea, unspecified: Secondary | ICD-10-CM

## 2014-05-03 DIAGNOSIS — N951 Menopausal and female climacteric states: Secondary | ICD-10-CM

## 2014-05-03 DIAGNOSIS — N915 Oligomenorrhea, unspecified: Secondary | ICD-10-CM

## 2014-05-03 MED ORDER — MEDROXYPROGESTERONE ACETATE 10 MG PO TABS: ORAL_TABLET | ORAL | Status: DC

## 2014-05-03 NOTE — Patient Instructions (Signed)
Cada 30-35 dias si no te baja el periodo te tomas el provera una diaria por 10 dias

## 2014-05-03 NOTE — Progress Notes (Signed)
   Patient presented to the office today to discuss her ultrasound. She was seen in the office as a new patient on 04/12/2014.Patient is from [place] and had her last gynecological exam in 2013. Patient denies any abnormal Pap smears in the past. She is complaining of vasomotor symptoms consisting of hot flashes, irritability, mood swing and decreased libido. She states her cycles are irregular they can be from 6-8 weeks apart. Patient with no prior mammogram. Patient with prior history of tubal sterilization procedure as well as right salpingo-oophorectomy as a result of a ruptured ectopic she's also had 1 normal vaginal delivery and one cesarean section and one spontaneous miscarriage.  Her recent [place] prolactin and TSH were normal. As was her Pap smear.  Ultrasound: Uterus demonstrated a measurement of 9.5 x 7.3 x 5.5 cm an endometrial stripe of 9.6 mm she's found to have 2 small fibroids the largest measuring 24 x 19 mm intramural left the room and normal in the right corpus luteum cyst measuring 20 x 17 mm was noted right adnexa normal no free fluid seen in the cul-de-sac.  Assessment/plan: Patient with history of oligomenorrhea not menopausal yet. She was prescribed Provera 10 mg to take 1 by mouth daily for 10 days of each month she does not have a spontaneous menses q. 35 days monthly. Patient's recent mammogram was normal. We will see her back in one year or when necessary.

## 2014-09-09 ENCOUNTER — Encounter: Payer: Self-pay | Admitting: Gynecology

## 2015-04-16 ENCOUNTER — Telehealth: Payer: Self-pay | Admitting: Cardiovascular Disease

## 2015-04-16 NOTE — Telephone Encounter (Signed)
04/16/2015 Received faxed referral packet on patient from [facility] for upcoming appointment with Dr. Sallyanne Kuster on 06/23/2015 @ 8:00 am .  Records given to [facility].  cbr

## 2015-04-25 ENCOUNTER — Encounter: Payer: Self-pay | Admitting: Gynecology

## 2015-05-09 ENCOUNTER — Encounter: Payer: Self-pay | Admitting: Gynecology

## 2015-05-09 ENCOUNTER — Ambulatory Visit (INDEPENDENT_AMBULATORY_CARE_PROVIDER_SITE_OTHER): Payer: 59 | Admitting: Gynecology

## 2015-05-09 VITALS — BP 128/74 | Ht 63.0 in | Wt 185.0 lb

## 2015-05-09 DIAGNOSIS — D500 Iron deficiency anemia secondary to blood loss (chronic): Secondary | ICD-10-CM

## 2015-05-09 DIAGNOSIS — N920 Excessive and frequent menstruation with regular cycle: Secondary | ICD-10-CM

## 2015-05-09 DIAGNOSIS — N951 Menopausal and female climacteric states: Secondary | ICD-10-CM | POA: Diagnosis not present

## 2015-05-09 DIAGNOSIS — Z01419 Encounter for gynecological examination (general) (routine) without abnormal findings: Secondary | ICD-10-CM

## 2015-05-09 DIAGNOSIS — D251 Intramural leiomyoma of uterus: Secondary | ICD-10-CM

## 2015-05-09 MED ORDER — MEGESTROL ACETATE 40 MG PO TABS: 40.0000 mg | ORAL_TABLET | Freq: Two times a day (BID) | ORAL | Status: DC

## 2015-05-09 NOTE — Patient Instructions (Signed)
Ecografa transvaginal (Transvaginal Ultrasound) La ecografa transvaginal es una ecografa plvica en la que se utiliza una probeta metlica que se coloca en la vagina, para observar los rganos femeninos. El ecgrafo enva ondas sonoras desde un transductor (sonda). Estas ondas sonoras chocan contra las estructuras del cuerpo (como un eco) y crean Proofreader. La imagen se observa en un monitor. Se denomina transvaginal debido a que la sonda se inserta dentro de la vagina. Puede haber una pequea molestia por la introduccin de la sonda. Esta prueba tambin puede realizarse [employer]. La ecografa endovaginal es otro nombre para la ecografa transvaginal. En una ecografa transabdominal, la sonda se coloca en la parte externa del abdomen. Este mtodo no ofrece imgenes tan buenas como la tcnica transvaginal. La ecogafa transvaginal se utiliza para observar alteraciones en el tracto genital femenino. Entre ellos se incluyen:  Problemas de infertilidad.  Malformaciones congnitas (defecto de nacimiento) del tero y los ovarios.  Tumores en el tero.  Hemorragias anormales.  Tumores y quistes de ovario.  Abscesos (tejidos inflamados y pus) en la pelvis.  Dolor abdominal o plvico sin causa aparente.  Infecciones plvicas. DURANTE EL EMBARAZO, SE UTILIZA PAR OBSERVAR:  Embarazos normales.  Un embarazo ectpico (embarazo fuera del tero).  Latidos cardacos fetales.  Anormalidades de la pelvis que no se observan bien con la ecografa transabdominal.  Sospecha de gemelos o embarazo mltiple.  Aborto inminente.  Problemas en el cuello del tero (cuello incompetente, no permanece cerrado para contener al beb).  Cuando se realiza una amniocentesis (se retira lquido de la bolsa [place], para ser [place]).  Al buscar anormalidades en el beb.  Para controlar el crecimiento, el desarrollo y la edad del feto.  Para medir la cantidad de lquido en el saco  amnitico.  Cuando se realiza una versin externa del beb (se lo mueve a Programmer, applications).  Evaluar al beb en embarazos de alto riesgo (perfil biofsico).  Si se sospecha el deceso del beb (muerte). En algunos casos, se utiliza un mtodo especial denominado ecografa con infusin salina, para una observacin ms precisa del tero. Se inyecta solucin salina (agua con sal) dentro del tero en pacientes no embarazadas para observar mejor su interior. Este mtodo no se Designer, fashion/clothing. La probeta tambin puede usarse para obtener biopsias de Educational psychologist, para drenar lquido de quistes de ovario y para Designer, jewellery un DIU (dispositivo intrauterino para el control de la natalidad) que no pueda [place]. PREPARACIN PARA LA PRUEBA La ecografa transvaginal se realiza con la vejiga vaca. La ecografa transabdominal se realiza con la vejiga llena. Podrn solicitarle que beba varios vasos de agua antes del examen. En algunos casos se realiza una ecografa transabdominal antes de la ecografa transvaginal para obervar los rganos del abdomen. PROCEDIMIENTO  Deber acostarse en una cama, con las rodillas dobladas y los pies en los estribos. La probeta se cubre con un condn. Dentro de la vagina y en la probeta se aplica un lubricante estril. El lubricante ayuda a transmitir las ondas sonoras y Fish farm manager la irritacin de la vagina. El mdico mover la sonda en el interior de la cavidad vaginal para escanear las estructuras plvicas. Un examen normal mostrar una pelvis normal y contenidos normales en su interior. Una prueba anormal mostrar anormalidades en la pelvis, la placenta o el beb. LAS CAUSAS DE UN RESULTADO ANORMAL PUEDEN SER:  Crecimientos o tumores en:  El tero.  Los ovarios.  La vagina.  Otras estructuras plvicas.  Crecimientos no cancerosos  en el tero y los ovarios.  El ovario se retuerce y se corta el suministro de [place]ma [place] (t[place]in [place]).  Las reas de  infeccin incluyen:  Enfermedad inflamatoria plvica.  Un absceso en la pelvis.  Ubicacin de un DIU. LOS PROBLEMAS QUE PUEDEN HALLARSE EN UNA MUJER EMBARAZADA SON:  Embarazo ectpico (embarazo fuera del tero).  Embarazos mltiples.  Dilatacin (apertura) precoz anormal del cuello del tero. Esto puede indicar un cuello incompetente y Biomedical scientist.  Aborto inminente.  Muerte fetal.  Los problemas con la placenta incluyen:  La placenta se ha desarrollado sobre la abertura del cuello del tero (placenta previa).  La placenta se ha separado anticipadamente en el tero (abrupcin placentaria).  La placenta se desarrolla en el msculo del tero (placenta acreta).  Tumores del Media planner, incluyendo la enfermedad trofoblstica gestacional. Se trata de un embarazo anormal en el que no hay feto. El tero se llena de quistes similares a uvas que en algunos casos son cancerosos.  Posicin incorrecta del feto (de nalgas, de vrtice).  Retraso del desarrollo fetal intrauterino (escaso desarrollo en el tero).  Anormalidades o infeccin fetal. RIESGOS Y COMPLICACIONES No hay riesgos conocidos para la ecografa. No se toman radiografas cuando se realiza una ecografa. Document Released: 02/10/2009 Document Revised: 01/17/2012 [facility] Patient Information 2015 [place]. This information is not intended to replace advice given to you by your health care provider. Make sure you discuss any questions you have with your health care provider. Fibromas (Fibroids) Los fibromas son bultos (tumores) que pueden Administrator del cuerpo de Musician. Estos tumores no son cancerosos. Pueden variar en tamao, peso y lugar en el que crecen. CUIDADOS EN EL HOGAR  No tome aspirina.  Anote el nmero de apsitos o tampones que [place] durante el perodo. Infrmelo a su mdico. Esto puede ayudar a determinar el mejor tratamiento para usted. SOLICITE AYUDA DE INMEDIATO SI:  Siente  dolor en la zona inferior del vientre (abdomen) y no se alivia con analgsicos.  Tiene clicos que no se calman con medicamentos  Aumenta el sangrado entre perodos o durante el mismo.  Sufre mareos o se desvanece (se desmaya).  El dolor en el vientre [place]. ASEGRESE DE QUE:  Comprende estas instrucciones.  Controlar su enfermedad.  Solicitar ayuda de inmediato si no mejora o empeora. Document Released: 02/09/2011 Document Revised: 01/17/2012 [facility] Patient Information 2015 [place]. This information is not intended to replace advice given to you by your health care provider. Make sure you discuss any questions you have with your health care provider. Biopsia de endometrio - Music therapist (Endometrial Biopsy, Care After) Siga estas instrucciones durante las prximas semanas. Estas indicaciones le proporcionan informacin general acerca de cmo deber cuidarse despus del procedimiento. El mdico tambin podr darle instrucciones ms especficas. El tratamiento se ha planificado de acuerdo a las prcticas mdicas actuales, pero a veces se producen problemas. Comunquese con el mdico si tiene algn problema o tiene dudas despus del procedimiento. QU ESPERAR DESPUS DEL PROCEDIMIENTO Despus del procedimiento, es tpico tener las siguientes sensaciones:  Sentir clicos leves y tendr una pequea cantidad de sangrado vaginal durante algunos das despus del procedimiento. Esto es normal. INSTRUCCIONES PARA EL CUIDADO EN EL HOGAR  Tome slo medicamentos de venta libre o recetados, segn las indicaciones del mdico.  No utilice tampones, duchas vaginales ni tenga relaciones sexuales hasta que el profesional la autorice.  Siga las indicaciones del mdico relacionadas con la restriccin a ciertas actividades, como ejercicios fsicos  intensos o levantar objetos pesados. SOLICITE ATENCIN MDICA SI:  Tiene un sangrado abundante o sangra durante ms de 2 das despus  del procedimiento.  Advierte un olor ftido que proviene de la vagina.  Siente escalofros o tiene fiebre.  Siente un dolor en el bajo vientre (abdominal) muy intenso. SOLICITE ATENCIN MDICA DE INMEDIATO SI:  Siente clicos intensos en el estmago o en la espalda.  Elimina cogulos grandes.  La hemorragia aumenta.  Se siente mareada, dbil, o se desmaya. Document Released: 08/15/2013 [facility] Patient Information 2015 [place]. This information is not intended to replace advice given to you by your health care provider. Make sure you discuss any questions you have with your health care provider.

## 2015-05-09 NOTE — Progress Notes (Signed)
Stephanie Gibson 1966/03/04 [id number]   History:    49 y.o.  for annual gyn exam who is perimenopausal. Last year FSH was 13.5 prolactin was 12.56 she was skipping menstrual cycles. She is doing this once again sometimes her cycle she says her be very heavy lasting up to 2 weeks like recently. She's also had some vasomotor symptoms. Patient denies any eye her history of any abnormal Pap smears. Patient overdue for her mammogram.Patient with prior history of tubal sterilization procedure as well as right salpingo-oophorectomy as a result of a ruptured ectopic she's also had 1 normal vaginal delivery and one cesarean section and one spontaneous miscarriage.patient infrequently suffers from urinary incontinence if her bladder before she cannot get to the bathroom time but most of the time she is continent. She does have history of fibroid uterus.ultrasound last year demonstrated the following:  Ultrasound: Uterus demonstrated a measurement of 9.5 x 7.3 x 5.5 cm an endometrial stripe of 9.6 mm she's found to have 2 small fibroids the largest measuring 24 x 19 mm intramural left the room and normal in the right corpus luteum cyst measuring 20 x 17 mm was noted right adnexa normal no free fluid seen in the cul-de-sac.  Patient stated recently she was seen by her primary care physician and because of her anemia has her on iron supplementation.  Past medical history,surgical history, family history and social history were all reviewed and documented in the EPIC chart.  Gynecologic History Patient's last menstrual period was 05/02/2015. Contraception: tubal ligation Last Pap: 2015. Results were: normal Last mammogram: 2015. Results were: normal  Obstetric History OB History  Gravida Para Term Preterm AB SAB TAB Ectopic Multiple Living  4 2   2 1  1  2     # Outcome Date GA Lbr Len/2nd Weight Sex Delivery Anes PTL Lv  4 Ectopic           3 Para           2 Para           1 SAB                 ROS: A ROS was performed and pertinent positives and negatives are included in the history.  GENERAL: No fevers or chills. HEENT: No change in vision, no earache, sore throat or sinus congestion. NECK: No pain or stiffness. CARDIOVASCULAR: No chest pain or pressure. No palpitations. PULMONARY: No shortness of breath, cough or wheeze. GASTROINTESTINAL: No abdominal pain, nausea, vomiting or diarrhea, melena or bright red blood per rectum. GENITOURINARY: No urinary frequency, urgency, hesitancy or dysuria. MUSCULOSKELETAL: No joint or muscle pain, no back pain, no recent trauma. DERMATOLOGIC: No rash, no itching, no lesions. ENDOCRINE: No polyuria, polydipsia, no heat or cold intolerance. No recent change in weight. HEMATOLOGICAL: No anemia or easy bruising or bleeding. NEUROLOGIC: No headache, seizures, numbness, tingling or weakness. PSYCHIATRIC: No depression, no loss of interest in normal activity or change in sleep pattern.     Exam: chaperone present  BP 128/74 mmHg  Ht 5\' 3"  (1.6 m)  Wt 185 lb (83.915 kg)  BMI 32.78 kg/m2  LMP 05/02/2015  Body mass index is 32.78 kg/(m^2).  General appearance : Well developed well nourished female. No acute distress HEENT: Eyes: no retinal hemorrhage or exudates,  Neck supple, trachea midline, no carotid bruits, no thyroidmegaly Lungs: Clear to auscultation, no rhonchi or wheezes, or rib retractions  Heart: Regular rate and rhythm, no murmurs or  gallops Breast:Examined in sitting and supine position were symmetrical in appearance, no palpable masses or tenderness,  no skin retraction, no nipple inversion, no nipple discharge, no skin discoloration, no axillary or supraclavicular lymphadenopathy Abdomen: no palpable masses or tenderness, no rebound or guarding Extremities: no edema or skin discoloration or tenderness  Pelvic:  Bartholin, Urethra, Skene Glands: Within normal limits             Vagina: No gross lesions or discharge  Cervix: No  gross lesions or discharge  Uterus  anteverted, normal size, shape and consistency, non-tender and mobile  Adnexa  Without masses or tenderness  Anus and perineum  normal   Rectovaginal  normal sphincter tone without palpated masses or tenderness             Hemoccult going out of the country this week will provide with colonoscopy instructions when she returns back in August   Because of patient's two-week history of any bleeding and she was counseled for an endometrial biopsy. The cervix was cleansed with Betadine solution. A sterile Pipelle was introduced into the uterine cavity. Uterus sounded to 7 cm. Moderate amount of tissue was obtained was submitted for histological evaluation.  Assessment/Plan:  49 y.o. female for annual exam is leaving for [place] in the next few days so she will return back in August for sonohysterogram to assess the intrauterine cavity because her bleeding irregular pattern. She is perimenopausal. 1 to check an [facility] today. I provided her with literature information also on endometrial ablation as well. Normal Pap smear last year. I provided her with a prescription for Megace 40 mg one by mouth twice a day for 10-14 days in the event that she is overseas and begins bleeding heavily. She was provided with a requisition to schedule her overdue mammogram. We discussed importance of monthly breast exam. Patient was examined in the supine position there was no evidence of urinary leakage on Valsalva. Patient with good support otherwise.   Terrance Mass MD, 3:00 PM 05/09/2015

## 2015-05-09 NOTE — Addendum Note (Signed)
Addended by: Burnett Kanaris on: 05/09/2015 03:26 PM   Modules accepted: Orders

## 2015-05-10 LAB — FOLLICLE STIMULATING HORMONE: FSH: 10.8 m[IU]/mL

## 2015-05-23 ENCOUNTER — Other Ambulatory Visit: Payer: Self-pay | Admitting: Gynecology

## 2015-05-23 DIAGNOSIS — D251 Intramural leiomyoma of uterus: Secondary | ICD-10-CM

## 2015-05-23 DIAGNOSIS — N920 Excessive and frequent menstruation with regular cycle: Secondary | ICD-10-CM

## 2015-06-13 ENCOUNTER — Other Ambulatory Visit: Payer: Self-pay

## 2015-06-13 ENCOUNTER — Ambulatory Visit
Admission: RE | Admit: 2015-06-13 | Discharge: 2015-06-13 | Disposition: A | Discharge status: Home / Self Care | Payer: 59 | Source: Ambulatory Visit

## 2015-06-13 DIAGNOSIS — Z1231 Encounter for screening mammogram for malignant neoplasm of breast: Secondary | ICD-10-CM

## 2015-06-18 ENCOUNTER — Telehealth: Payer: Self-pay | Admitting: Gynecology

## 2015-06-18 NOTE — Telephone Encounter (Signed)
06/18/15-I gave Stephanie Gibson the following information to tell the patient in Spanish: Her [facility] insurance covers the sonohysterogram with 20% coins. The allowable is $875.76 so her cost is $175.16.wl

## 2015-06-23 ENCOUNTER — Ambulatory Visit (INDEPENDENT_AMBULATORY_CARE_PROVIDER_SITE_OTHER): Payer: 59 | Admitting: Cardiovascular Disease

## 2015-06-23 ENCOUNTER — Encounter: Payer: Self-pay | Admitting: Cardiovascular Disease

## 2015-06-23 VITALS — BP 144/76 | HR 83 | Ht 64.0 in | Wt 184.0 lb

## 2015-06-23 DIAGNOSIS — R0600 Dyspnea, unspecified: Secondary | ICD-10-CM

## 2015-06-23 DIAGNOSIS — R0609 Other forms of dyspnea: Secondary | ICD-10-CM

## 2015-06-23 DIAGNOSIS — R079 Chest pain, unspecified: Secondary | ICD-10-CM | POA: Insufficient documentation

## 2015-06-23 DIAGNOSIS — I10 Essential (primary) hypertension: Secondary | ICD-10-CM

## 2015-06-23 DIAGNOSIS — R0602 Shortness of breath: Secondary | ICD-10-CM | POA: Insufficient documentation

## 2015-06-23 MED ORDER — ASPIRIN 81 MG PO TABS: 81.0000 mg | ORAL_TABLET | Freq: Every day | ORAL | Status: DC

## 2015-06-23 MED ORDER — ATENOLOL 25 MG PO TABS: 25.0000 mg | ORAL_TABLET | Freq: Every day | ORAL | Status: DC

## 2015-06-23 NOTE — Progress Notes (Signed)
Patient ID: Stephanie Gibson, female   DOB: 08/16/1966, 49 y.o.   MRN: [id number]     Cardiology Office Note   Date:  06/23/2015   ID:  Stephanie Gibson, DOB 1966/09/27, MRN [id number]  PCP:  Simonne Maffucci, MD  Cardiologist:   Sanda Klein, MD   Chief Complaint  Patient presents with  . Establish Care    Patient has felt dizzy, SOB, chest pressure at times, and swelling in her feet.      History of Present Illness: Stephanie Gibson is a 49 y.o. female who presents for  Complaints of exertional dyspnea and angina. Stephanie Gibson speaks Spanish only (Stephanie Gibson is from [place]) and a Optometrist was present.  Over the last year Stephanie Gibson has noticed worsening exertional dyspnea and chest pressure. This occurs at less than a flight of stairs. It has become very slowly worse. Stephanie Gibson has also noticed mild ankle swelling. Stephanie Gibson describes her chest tightness pushing with both palms of her hands in the middle of her chest. It has never occurred at rest and is consistently exertional.   Stephanie Gibson was diagnosed with hypertension roughly one year ago and has been taking amlodipine monotherapy since Stephanie Gibson thinks her blood pressure has recently well controlled. Stephanie Gibson does not have diabetes mellitus , has never smoked and there is no family history of heart disease. Stephanie Gibson is not sure about her cholesterol levels. Stephanie Gibson does have a history of iron deficiency anemia. Recent FSH level suggests that Stephanie Gibson is premenopausal.  Most recent labs performed just last week but I don't have those results available. These included a lipid profile.    Past Medical History  Diagnosis Date  . Ectopic pregnancy     Past Surgical History  Procedure Laterality Date  . Cesarean section    . Oophorectomy    . Tubal ligation       Current Outpatient Prescriptions  Medication Sig Dispense Refill  . albuterol (PROVENTIL HFA;VENTOLIN HFA) 108 (90 BASE) MCG/ACT inhaler Inhale 2 puffs into the lungs every 2 (two) hours as needed for wheezing or  shortness of breath (or coughing). 1 Inhaler 0  . amLODipine (NORVASC) 5 MG tablet Take 5 mg by mouth daily.    . IRON PO Take 1 tablet by mouth daily.    Marland Kitchen aspirin 81 MG tablet Take 1 tablet (81 mg total) by mouth daily. 30 tablet 11  . atenolol (TENORMIN) 25 MG tablet Take 1 tablet (25 mg total) by mouth daily. 30 tablet 5   No current facility-administered medications for this visit.    Allergies:   Review of patient's allergies indicates no known allergies.    Social History:  The patient  reports that Stephanie Gibson has never smoked. Stephanie Gibson does not have any smokeless tobacco history on file. Stephanie Gibson reports that Stephanie Gibson does not drink alcohol or use illicit drugs.   Family History:  The patient's family history includes Cancer in her father; Hypertension in her mother.    ROS:  Please see the history of present illness.    Otherwise, review of systems positive for none.   The patient specifically denies any chest pain at rest, dyspnea at rest, orthopnea, paroxysmal nocturnal dyspnea, syncope, palpitations, focal neurological deficits, intermittent claudication, unexplained weight gain, cough, hemoptysis or wheezing.  The patient also denies abdominal pain, nausea, vomiting, dysphagia, diarrhea, constipation, polyuria, polydipsia, dysuria, hematuria, frequency, urgency, abnormal bleeding or bruising, fever, chills, unexpected weight changes, mood swings, change in skin or hair texture, change in voice quality, auditory or  visual problems, allergic reactions or rashes, new musculoskeletal complaints other than usual "aches and pains".  All other systems are reviewed and negative.    PHYSICAL EXAM: VS:  BP 144/76 mmHg  Pulse 83  Ht 5\' 4"  (1.626 m)  Wt 184 lb (83.462 kg)  BMI 31.57 kg/m2 , BMI Body mass index is 31.57 kg/(m^2).  General: Alert, oriented x3, no distress Head: no evidence of trauma, PERRL, EOMI, no exophtalmos or lid lag, no myxedema, no xanthelasma; normal ears, nose and  oropharynx Neck: normal jugular venous pulsations and no hepatojugular reflux; brisk carotid pulses without delay and no carotid bruits Chest: clear to auscultation, no signs of consolidation by percussion or palpation, normal fremitus, symmetrical and full respiratory excursions Cardiovascular: normal position and quality of the apical impulse, regular rhythm, normal first and second heart sounds, no murmurs, rubs or gallops Abdomen: no tenderness or distention, no masses by palpation, no abnormal pulsatility or arterial bruits, normal bowel sounds, no hepatosplenomegaly Extremities: no clubbing, cyanosis or edema; 2+ radial, ulnar and brachial pulses bilaterally; 2+ right femoral, posterior tibial and dorsalis pedis pulses; 2+ left femoral, posterior tibial and dorsalis pedis pulses; no subclavian or femoral bruits Neurological: grossly nonfocal Psych: euthymic mood, full affect   EKG:  EKG is ordered today. The ekg ordered today demonstrates  Normal sinus rhythm with very subtle ST depression in the inferior leads as well as in V6 but with upright T waves. The appearance is almost suggestive of LVH , but Stephanie Gibson does not quite meet voltage criteria. QTC 422ms   Recent Labs: No results found for requested labs within last 365 days.    Lipid Panel No results found for: CHOL, TRIG, HDL, CHOLHDL, VLDL, LDLCALC, LDLDIRECT    Wt Readings from Last 3 Encounters:  06/23/15 184 lb (83.462 kg)  05/09/15 185 lb (83.915 kg)  04/12/14 181 lb (82.101 kg)      Other studies Reviewed: Additional studies/ records that were reviewed today include:  Records from primary care provider.   ASSESSMENT AND PLAN:   Stephanie Gibson has symptoms that are very strongly suggestive of exertional angina pectoris accompanied by exertional dyspnea. Stephanie Gibson does not describe symptoms to suggest unstable angina/angina at rest. Although Stephanie Gibson reports ankle edema on physical exam there is no clear evidence of volume  overload. Stephanie Gibson has hypertension and on her current medications her blood pressure is only slightly above normal. Stephanie Gibson is premenopausal,  does not have any other coronary risk factors and has  Minimal ECG changes.  Her likelihood of significant coronary artery disease is intermediate. I think Stephanie Gibson is best suited for initial noninvasive evaluation with echocardiography and a treadmill stress test. If LV function is abnormal or her stress test is abnormal Stephanie Gibson should undergo coronary angiography. If however all her noninvasive evaluation is normal and we can achieve symptom relief with medication, I would then avoid and invasive testing. I have asked her to start treatment with a beta blocker in addition to her amlodipine. Stephanie Gibson takes an occasional albuterol inhaler but very rarely has wheezing. I don't think Stephanie Gibson has a firm diagnosis of asthma either. Have also recommended that Stephanie Gibson take a daily aspirin 81 mg. Review her laboratory tests  Once we get a copy.      Current medicines are reviewed at length with the patient today.  The patient does not have concerns regarding medicines.  Labs/ tests ordered today include:  Orders Placed This Encounter  Procedures  . Exercise Tolerance Test  .  EKG 12-Lead  . ECHOCARDIOGRAM COMPLETE      Patient Instructions  Medication Instructions:   START ATENOLOL 25MG  DAILY  START ASPIRIN 81MG  DAILY    Testing/Procedures:  Your physician has requested that you have an echocardiogram. Echocardiography is a painless test that uses sound waves to create images of your heart. It provides your doctor with information about the size and shape of your heart and how well your heart's chambers and valves are working. This procedure takes approximately one hour. There are no restrictions for this procedure.  Your physician has requested that you have an exercise tolerance test. For further information please visit [email or web address]. Please also follow instruction sheet,  as given.    Follow-Up:  NEXT AVAILABLE WITH DR. Sallyanne Kuster.       Mikael Spray, MD  06/23/2015 6:19 PM    Sanda Klein, MD, [facility] [phone] office [phone] pager

## 2015-06-23 NOTE — Patient Instructions (Signed)
Medication Instructions:   START ATENOLOL 25MG  DAILY  START ASPIRIN 81MG  DAILY    Testing/Procedures:  Your physician has requested that you have an echocardiogram. Echocardiography is a painless test that uses sound waves to create images of your heart. It provides your doctor with information about the size and shape of your heart and how well your heart's chambers and valves are working. This procedure takes approximately one hour. There are no restrictions for this procedure.  Your physician has requested that you have an exercise tolerance test. For further information please visit [email or web address]. Please also follow instruction sheet, as given.    Follow-Up:  NEXT AVAILABLE WITH DR. Sallyanne Kuster.

## 2015-06-27 ENCOUNTER — Ambulatory Visit (INDEPENDENT_AMBULATORY_CARE_PROVIDER_SITE_OTHER): Payer: 59 | Admitting: Gynecology

## 2015-06-27 ENCOUNTER — Ambulatory Visit (INDEPENDENT_AMBULATORY_CARE_PROVIDER_SITE_OTHER): Payer: 59

## 2015-06-27 ENCOUNTER — Encounter: Payer: Self-pay | Admitting: Gynecology

## 2015-06-27 DIAGNOSIS — D251 Intramural leiomyoma of uterus: Secondary | ICD-10-CM

## 2015-06-27 DIAGNOSIS — N920 Excessive and frequent menstruation with regular cycle: Secondary | ICD-10-CM

## 2015-06-27 DIAGNOSIS — D500 Iron deficiency anemia secondary to blood loss (chronic): Secondary | ICD-10-CM

## 2015-06-27 MED ORDER — LEVONORGESTREL-ETHINYL ESTRAD 0.1-20 MG-MCG PO TABS
ORAL_TABLET | ORAL | Status: DC
Start: 2015-06-27 — End: 2015-08-11

## 2015-06-27 MED ORDER — MEGESTROL ACETATE 40 MG PO TABS: ORAL_TABLET | ORAL | Status: DC

## 2015-06-27 NOTE — Progress Notes (Signed)
   Patient presented to the office today for sonohysterogram as part of her evaluation for her menorrhagia. She was seen the office on 05/09/2015 with the following noted:  Last year [place] was 13.5 prolactin was 12.56 she was skipping menstrual cycles. She is doing this once again sometimes her cycle she says her be very heavy lasting up to 2 weeks like recently. She's also had some vasomotor symptoms. Patient denies any prior history of any abnormal Pap smears.Patient with prior history of tubal sterilization procedure as well as right salpingo-oophorectomy as a result of a ruptured ectopic she's also had 1 normal vaginal delivery and one cesarean section and one spontaneous miscarriage.patient infrequently suffers from urinary incontinence if her bladder before she cannot get to the bathroom time but most of the time she is continent. She does have history of fibroid uterus.ultrasound last year demonstrated the following:  Ultrasound: Uterus demonstrated a measurement of 9.5 x 7.3 x 5.5 cm an endometrial stripe of 9.6 mm she's found to have 2 small fibroids the largest measuring 24 x 19 mm intramural left the room and normal in the right corpus luteum cyst measuring 20 x 17 mm was noted right adnexa normal no free fluid seen in the cul-de-sac.  Patient stated recently she was seen by her primary care physician and because of her anemia has her on iron supplementation.  As part of that office visit patient underwent an endometrial biopsy and the pathology report demonstrated the following:  Diagnosis Endometrium, biopsy - POLYPOID ENDOMETRIUM WITH SECRETORY ACTIVITY. - NO HYPERPLASIA, ATYPIA OR MALIGNANCY IDENTIFIED.  Pap smear 2015 was normal  Patient was given prescription for Megace 40 mg to take 1 by mouth twice a day in the event she had a heavy bleeding episode well she was visiting family in [place]. She started bleeding once again and is here for follow-up.  Sonohysterogram today: The cervix  was cleansed with Betadine solution a sterile catheter was introduced into the uterine cavity. An intramural fibroid measuring 43 x 38 x 43 mm was noted right adnexa negative left ovary normal. No fluid in the cul-de-sac. After injecting the normal saline there was no intracavitary uterine defect.  Assessment/plan: We had a lengthy discussion of treatment options. She will be prescribed Megace 40 mg to take 3 times a day for 1 week and then to begin taking a 20 g oral contraception pill daily and have her withdrawal every 3 months. She will continue on her iron supplementation. If this doesn't work we had also discuss other options to include endometrial ablation as well as Mirena IUD and finally of all fails hysterectomy. All instructions were provided in written format in Spanish.

## 2015-06-27 NOTE — Patient Instructions (Signed)
#  1 Tomar el Megace ina tres veces al dai por una semana #2 Despues empezar pastilla para regulat: Tomar primer tas tres fillas de pastilla los [employer] paquetes y Dance movement psychotherapist paquete tomar las quatro fillas de [place]. #3 Tomar una partilla de hierro diaria

## 2015-08-08 ENCOUNTER — Other Ambulatory Visit: Payer: Self-pay

## 2015-08-08 ENCOUNTER — Other Ambulatory Visit (INDEPENDENT_AMBULATORY_CARE_PROVIDER_SITE_OTHER): Payer: 59

## 2015-08-08 ENCOUNTER — Other Ambulatory Visit: Payer: Self-pay | Admitting: *Deleted

## 2015-08-08 ENCOUNTER — Encounter: Payer: 59 | Admitting: Nurse Practitioner

## 2015-08-08 ENCOUNTER — Other Ambulatory Visit: Payer: Self-pay | Admitting: Nurse Practitioner

## 2015-08-08 ENCOUNTER — Ambulatory Visit (HOSPITAL_COMMUNITY): Payer: 59 | Attending: Cardiovascular Disease

## 2015-08-08 ENCOUNTER — Ambulatory Visit (INDEPENDENT_AMBULATORY_CARE_PROVIDER_SITE_OTHER): Payer: 59

## 2015-08-08 DIAGNOSIS — I517 Cardiomegaly: Secondary | ICD-10-CM | POA: Diagnosis not present

## 2015-08-08 DIAGNOSIS — R0600 Dyspnea, unspecified: Secondary | ICD-10-CM | POA: Diagnosis not present

## 2015-08-08 DIAGNOSIS — R079 Chest pain, unspecified: Secondary | ICD-10-CM

## 2015-08-08 DIAGNOSIS — I10 Essential (primary) hypertension: Secondary | ICD-10-CM | POA: Diagnosis not present

## 2015-08-08 DIAGNOSIS — R0789 Other chest pain: Secondary | ICD-10-CM

## 2015-08-08 LAB — CBC WITH DIFFERENTIAL/PLATELET
Basophils Absolute: 0.1 10*3/uL (ref 0.0–0.1)
Basophils Relative: 1 % (ref 0–1)
Eosinophils Absolute: 0.1 10*3/uL (ref 0.0–0.7)
Eosinophils Relative: 1 % (ref 0–5)
HCT: 25 % — ABNORMAL LOW (ref 36.0–46.0)
Hemoglobin: 8 g/dL — ABNORMAL LOW (ref 12.0–15.0)
Lymphocytes Relative: 19 % (ref 12–46)
Lymphs Abs: 1.7 10*3/uL (ref 0.7–4.0)
MCH: 24 pg — ABNORMAL LOW (ref 26.0–34.0)
MCHC: 32 g/dL (ref 30.0–36.0)
MCV: 74.9 fL — ABNORMAL LOW (ref 78.0–100.0)
MPV: 11.4 fL (ref 8.6–12.4)
Monocytes Absolute: 0.6 10*3/uL (ref 0.1–1.0)
Monocytes Relative: 7 % (ref 3–12)
Neutro Abs: 6.3 10*3/uL (ref 1.7–7.7)
Neutrophils Relative %: 72 % (ref 43–77)
Platelets: 279 10*3/uL (ref 150–400)
RBC: 3.34 MIL/uL — ABNORMAL LOW (ref 3.87–5.11)
RDW: 16.2 % — ABNORMAL HIGH (ref 11.5–15.5)
WBC: 8.8 10*3/uL (ref 4.0–10.5)

## 2015-08-08 LAB — EXERCISE TOLERANCE TEST
CHL CUP RESTING HR STRESS: 87 {beats}/min
CSEPED: 2 min
CSEPHR: 91 %
CSEPPHR: 155 {beats}/min
Estimated workload: 4.6 METS

## 2015-08-08 LAB — BASIC METABOLIC PANEL
BUN: 8 mg/dL (ref 7–25)
CO2: 21 mmol/L (ref 20–31)
Calcium: 8.9 mg/dL (ref 8.6–10.4)
Chloride: 105 mmol/L (ref 98–110)
Creat: 0.66 mg/dL (ref 0.50–1.05)
Glucose, Bld: 92 mg/dL (ref 65–99)
Potassium: 3.7 mmol/L (ref 3.5–5.3)
Sodium: 139 mmol/L (ref 135–146)

## 2015-08-11 ENCOUNTER — Telehealth (HOSPITAL_COMMUNITY): Payer: Self-pay | Admitting: *Deleted

## 2015-08-11 ENCOUNTER — Encounter (HOSPITAL_COMMUNITY): Payer: Self-pay | Admitting: *Deleted

## 2015-08-11 ENCOUNTER — Inpatient Hospital Stay (HOSPITAL_COMMUNITY): Payer: 59

## 2015-08-11 ENCOUNTER — Emergency Department (HOSPITAL_COMMUNITY): Payer: 59

## 2015-08-11 ENCOUNTER — Observation Stay (HOSPITAL_COMMUNITY)
Admission: EM | Admit: 2015-08-11 | Discharge: 2015-08-13 | Disposition: A | Discharge status: Home / Self Care | Payer: 59 | Attending: Internal Medicine | Admitting: Internal Medicine

## 2015-08-11 ENCOUNTER — Other Ambulatory Visit: Payer: Self-pay

## 2015-08-11 DIAGNOSIS — R0602 Shortness of breath: Secondary | ICD-10-CM | POA: Diagnosis present

## 2015-08-11 DIAGNOSIS — D649 Anemia, unspecified: Secondary | ICD-10-CM

## 2015-08-11 DIAGNOSIS — Z79899 Other long term (current) drug therapy: Secondary | ICD-10-CM | POA: Insufficient documentation

## 2015-08-11 DIAGNOSIS — R079 Chest pain, unspecified: Secondary | ICD-10-CM | POA: Diagnosis present

## 2015-08-11 DIAGNOSIS — D500 Iron deficiency anemia secondary to blood loss (chronic): Principal | ICD-10-CM | POA: Insufficient documentation

## 2015-08-11 DIAGNOSIS — D251 Intramural leiomyoma of uterus: Secondary | ICD-10-CM | POA: Diagnosis not present

## 2015-08-11 DIAGNOSIS — I10 Essential (primary) hypertension: Secondary | ICD-10-CM | POA: Diagnosis present

## 2015-08-11 DIAGNOSIS — D509 Iron deficiency anemia, unspecified: Secondary | ICD-10-CM | POA: Diagnosis present

## 2015-08-11 HISTORY — DX: Essential (primary) hypertension: I10

## 2015-08-11 HISTORY — DX: Anemia, unspecified: D64.9

## 2015-08-11 HISTORY — DX: Leiomyoma of uterus, unspecified: D25.9

## 2015-08-11 LAB — CBC
HCT: 25.1 % — ABNORMAL LOW (ref 36.0–46.0)
HEMOGLOBIN: 7.7 g/dL — AB (ref 12.0–15.0)
MCH: 23.1 pg — ABNORMAL LOW (ref 26.0–34.0)
MCHC: 30.7 g/dL (ref 30.0–36.0)
MCV: 75.1 fL — ABNORMAL LOW (ref 78.0–100.0)
Platelets: 290 10*3/uL (ref 150–400)
RBC: 3.34 MIL/uL — ABNORMAL LOW (ref 3.87–5.11)
RDW: 15.9 % — AB (ref 11.5–15.5)
WBC: 9.4 10*3/uL (ref 4.0–10.5)

## 2015-08-11 LAB — ABO/RH: ABO/RH(D): A POS

## 2015-08-11 LAB — COMPREHENSIVE METABOLIC PANEL
ALBUMIN: 3.6 g/dL (ref 3.5–5.0)
ALT: 23 U/L (ref 14–54)
AST: 24 U/L (ref 15–41)
Alkaline Phosphatase: 69 U/L (ref 38–126)
Anion gap: 9 (ref 5–15)
BUN: 8 mg/dL (ref 6–20)
CHLORIDE: 104 mmol/L (ref 101–111)
CO2: 23 mmol/L (ref 22–32)
CREATININE: 0.87 mg/dL (ref 0.44–1.00)
Calcium: 8.8 mg/dL — ABNORMAL LOW (ref 8.9–10.3)
GFR calc Af Amer: >60 mL/min (ref >60)
GFR calc non Af Amer: >60 mL/min (ref >60)
Glucose, Bld: 123 mg/dL — ABNORMAL HIGH (ref 65–99)
Potassium: 3.7 mmol/L (ref 3.5–5.1)
SODIUM: 136 mmol/L (ref 135–145)
Total Bilirubin: 0.4 mg/dL (ref 0.3–1.2)
Total Protein: 7 g/dL (ref 6.5–8.1)

## 2015-08-11 LAB — I-STAT TROPONIN, ED: Troponin i, poc: 0 ng/mL (ref 0.00–0.08)

## 2015-08-11 LAB — PREPARE RBC (CROSSMATCH)

## 2015-08-11 IMAGING — CT CT ANGIO CHEST
2 of 8 series · 18 of 46 positions shown · IV contrast (OMNI)
Comparison: None.

CLINICAL DATA: Chest pain.  Short of breath.  Calf pain.

EXAM:
CT ANGIOGRAPHY CHEST WITH CONTRAST
TECHNIQUE: Multidetector CT imaging of the chest was performed using the
standard protocol during bolus administration of intravenous
contrast. Multiplanar CT image reconstructions and MIPs were
obtained to evaluate the vascular anatomy.
CONTRAST:  50mL OMNIPAQUE IOHEXOL 350 MG/ML SOLN

[Series 5: thins · axial · 0.61mm/px · z∈[+1281,+1483]mm · 15 of 224 slices shown]
[im 11/224  lung]
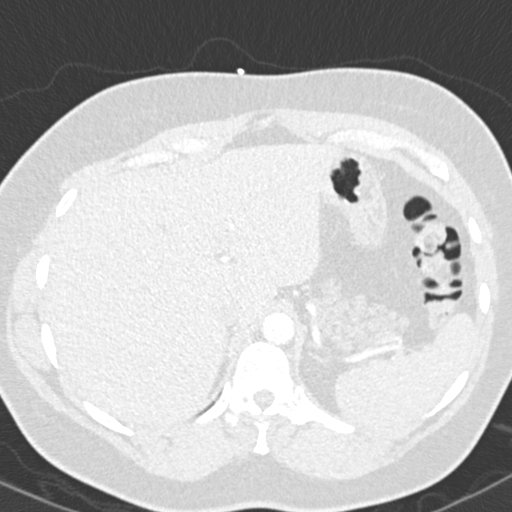
[im 31/224  soft-tissue]
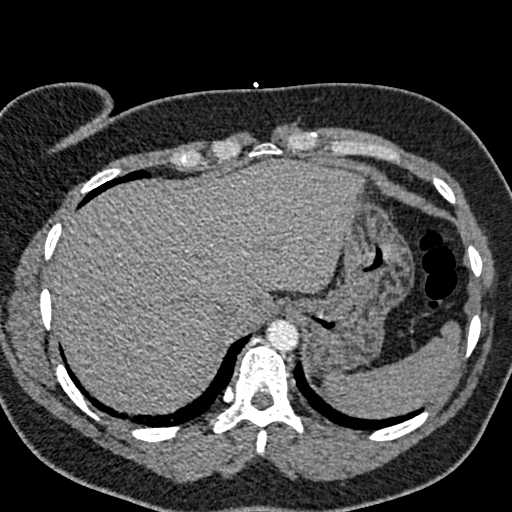
[im 41/224  lung]
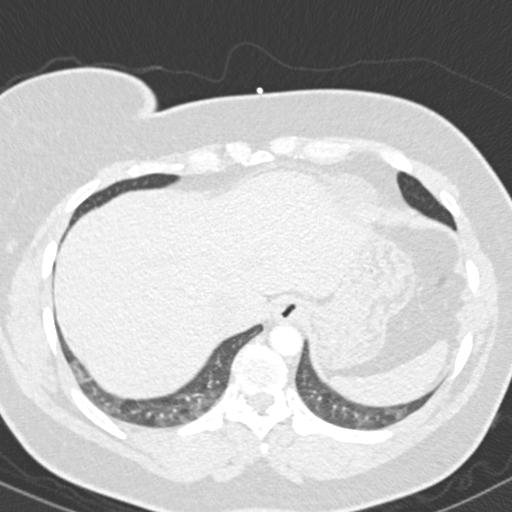
[im 51/224  soft-tissue]
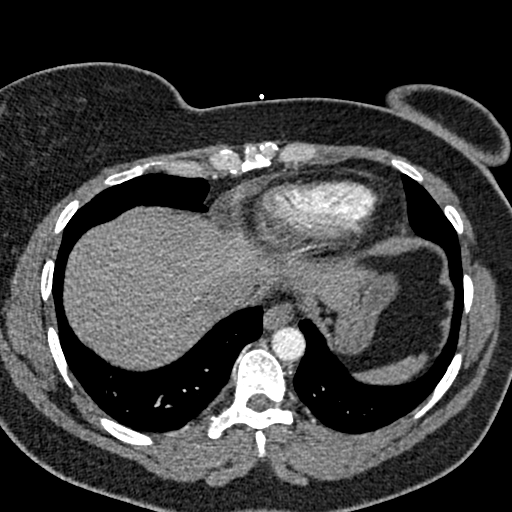
[im 71/224  lung]
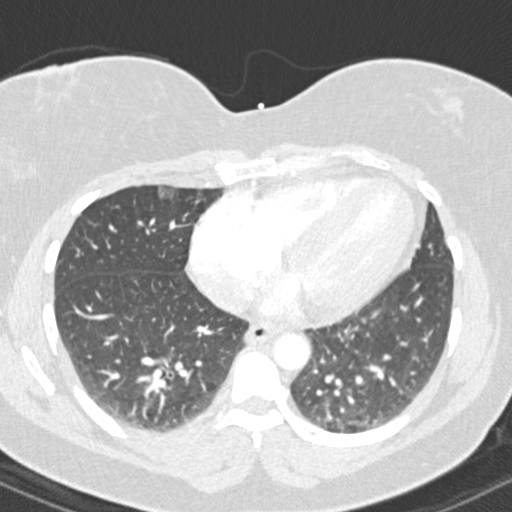
[im 82/224  soft-tissue]
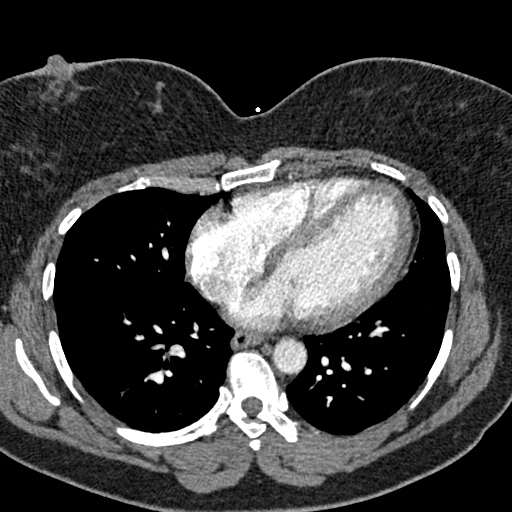
[im 102/224  lung]
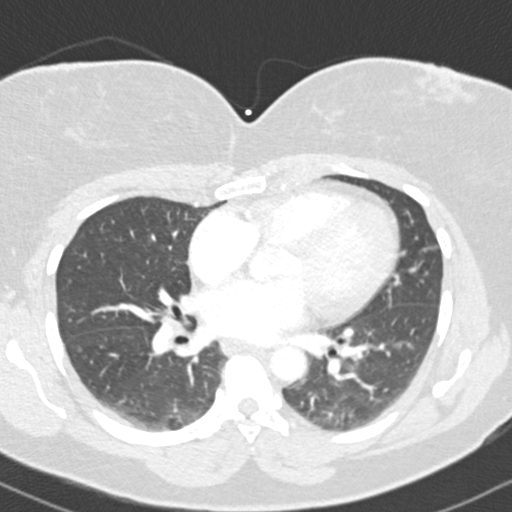
[im 112/224  soft-tissue]
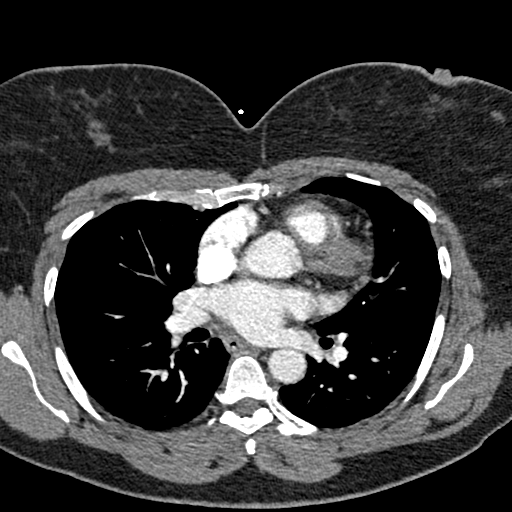
[im 122/224  lung]
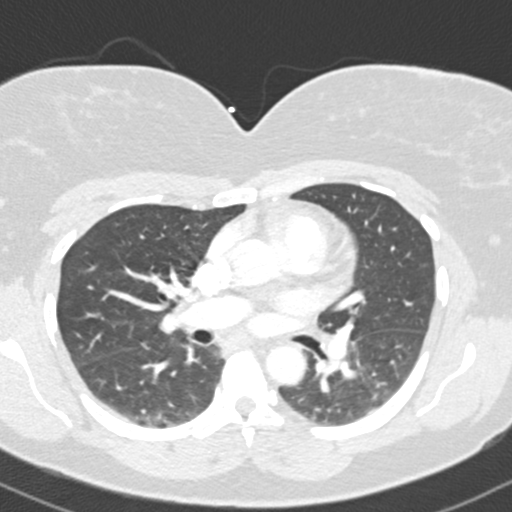
[im 142/224  soft-tissue]
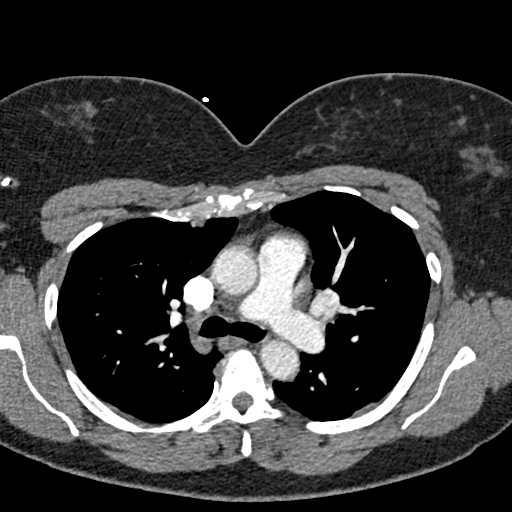
[im 153/224  lung]
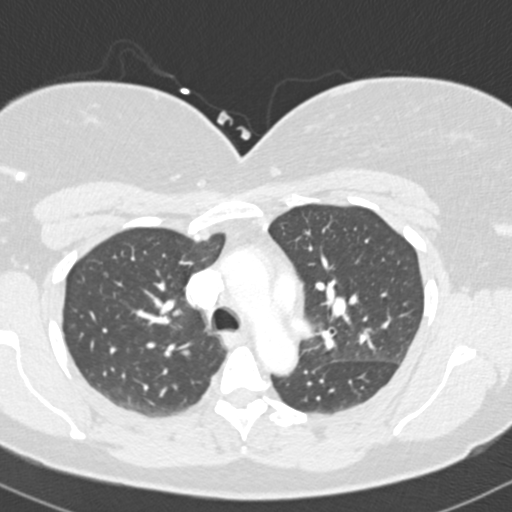
[im 173/224  soft-tissue]
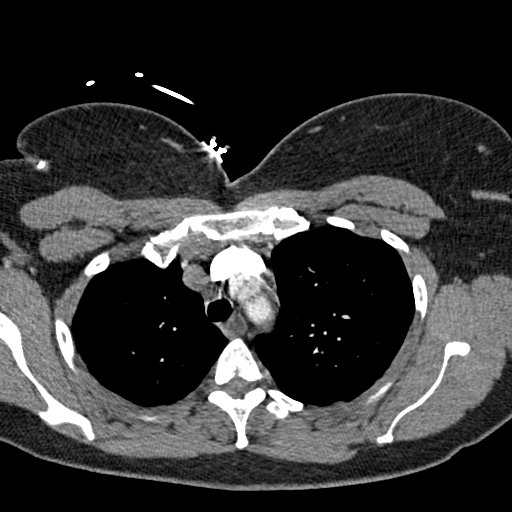
[im 183/224  lung]
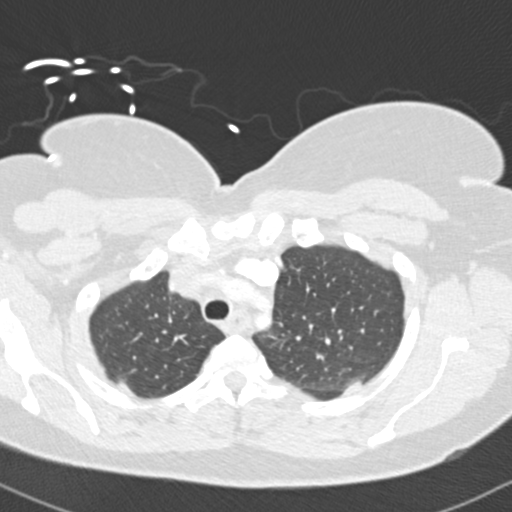
[im 193/224  soft-tissue]
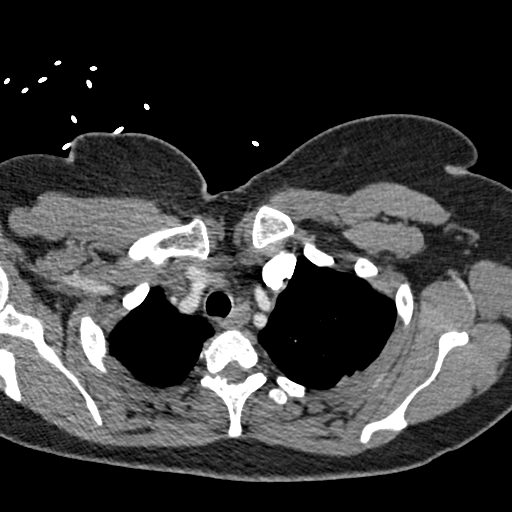
[im 213/224  lung]
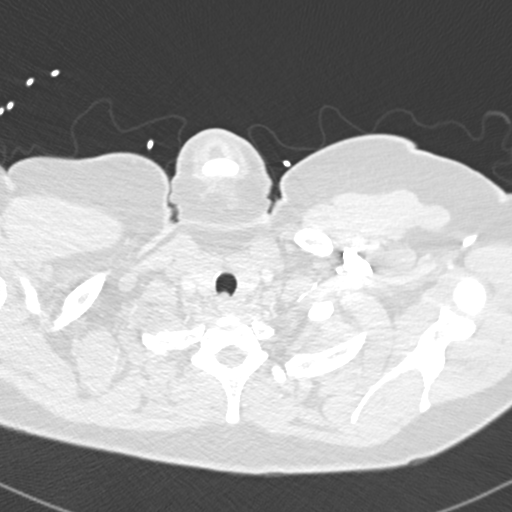

[Series 7: coronal mpr · coronal · 0.46mm/px · 3 of 117 slices shown]
[im 30/117  soft-tissue]
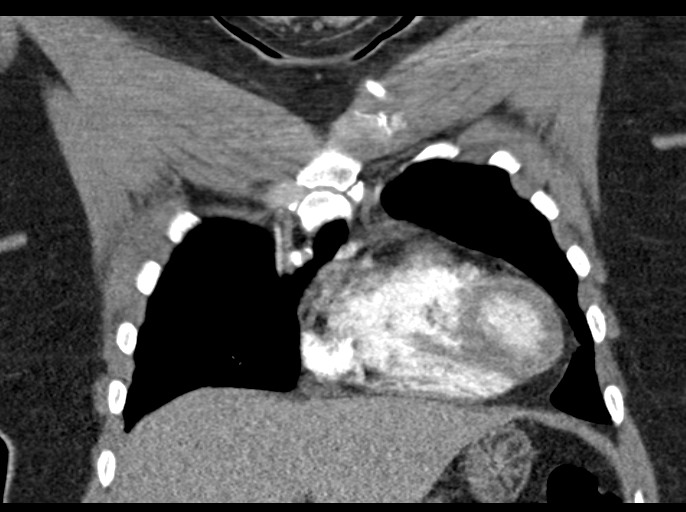
[im 59/117  soft-tissue]
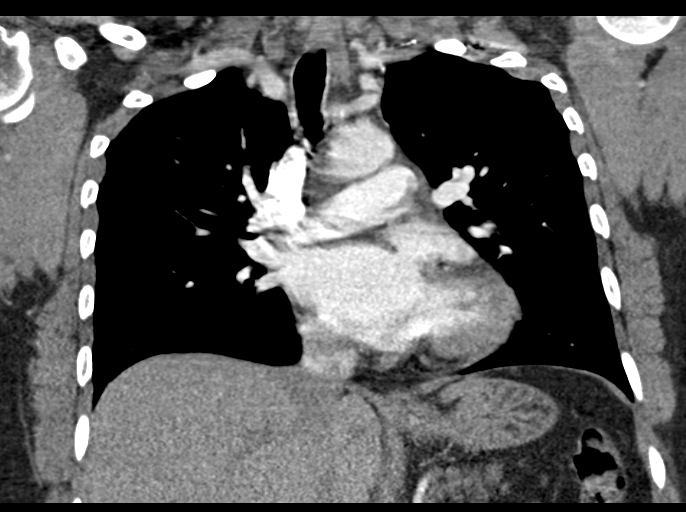
[im 88/117  soft-tissue]
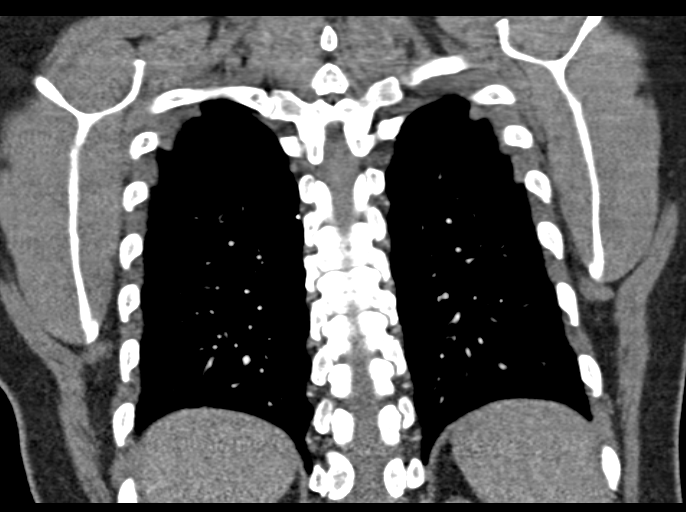

[18 of 46 positions shown; findings below may reference images not displayed]

FINDINGS: There are no filling defects in the pulmonary arterial tree to
suggest acute pulmonary thromboembolism.

There is no evidence of aortic dissection or aneurysm.

No evidence of abnormal mediastinal adenopathy.

No pneumothorax.  No pleural effusion

Small visceral pleural node in the right minor fissure on image 40
of series 6. Similar smaller node in the right major fissure on the
right on image 49. 4 mm left lower lobe pulmonary parenchymal nodule
on image 53.

No vertebral compression deformity.

Review of the MIP images confirms the above findings.
IMPRESSION: No evidence of acute pulmonary thromboembolism

4 mm left lower lobe pulmonary nodule. If the patient is at high
risk for bronchogenic carcinoma, follow-up chest CT at 1 year is
recommended. If the patient is at low risk, no follow-up is needed.
This recommendation follows the consensus statement: Guidelines for
Management of Small Pulmonary Nodules Detected on CT Scans: A
Statement from the [HOSPITAL] as published in Radiology

## 2015-08-11 MED ORDER — ATENOLOL 25 MG PO TABS: 25.0000 mg | ORAL_TABLET | Freq: Every day | ORAL | Status: DC

## 2015-08-11 MED ORDER — ONDANSETRON HCL 4 MG/2ML IJ SOLN: 4.0000 mg | Freq: Four times a day (QID) | INTRAMUSCULAR | Status: DC | PRN

## 2015-08-11 MED ORDER — SODIUM CHLORIDE 0.9 % IJ SOLN
3.0000 mL | Freq: Two times a day (BID) | INTRAMUSCULAR | Status: DC
  Administered 2015-08-12 – 2015-08-13 (×2): 3 mL via INTRAVENOUS

## 2015-08-11 MED ORDER — ATENOLOL 25 MG PO TABS
25.0000 mg | ORAL_TABLET | Freq: Every day | ORAL | Status: DC
  Administered 2015-08-12 – 2015-08-13 (×2): 25 mg via ORAL
  Filled 2015-08-11 (×3): qty 1

## 2015-08-11 MED ORDER — HYDRALAZINE HCL 20 MG/ML IJ SOLN: 5.0000 mg | INTRAMUSCULAR | Status: DC | PRN

## 2015-08-11 MED ORDER — ACETAMINOPHEN 325 MG PO TABS: 650.0000 mg | ORAL_TABLET | Freq: Four times a day (QID) | ORAL | Status: DC | PRN

## 2015-08-11 MED ORDER — ALBUTEROL SULFATE HFA 108 (90 BASE) MCG/ACT IN AERS: 2.0000 | INHALATION_SPRAY | RESPIRATORY_TRACT | Status: DC | PRN

## 2015-08-11 MED ORDER — AMLODIPINE BESYLATE 5 MG PO TABS
5.0000 mg | ORAL_TABLET | Freq: Every day | ORAL | Status: DC
  Administered 2015-08-12 – 2015-08-13 (×2): 5 mg via ORAL
  Filled 2015-08-11 (×3): qty 1

## 2015-08-11 MED ORDER — ONDANSETRON HCL 4 MG PO TABS: 4.0000 mg | ORAL_TABLET | Freq: Four times a day (QID) | ORAL | Status: DC | PRN

## 2015-08-11 MED ORDER — AMLODIPINE BESYLATE 5 MG PO TABS: 5.0000 mg | ORAL_TABLET | Freq: Every day | ORAL | Status: DC

## 2015-08-11 MED ORDER — IOHEXOL 350 MG/ML SOLN
80.0000 mL | Freq: Once | INTRAVENOUS | Status: AC | PRN
  Administered 2015-08-11: 50 mL via INTRAVENOUS

## 2015-08-11 MED ORDER — ACETAMINOPHEN 650 MG RE SUPP: 650.0000 mg | Freq: Four times a day (QID) | RECTAL | Status: DC | PRN

## 2015-08-11 MED ORDER — SODIUM CHLORIDE 0.9 % IV SOLN
Freq: Once | INTRAVENOUS | Status: AC
  Administered 2015-08-12: 04:00:00 via INTRAVENOUS

## 2015-08-11 MED ORDER — SODIUM CHLORIDE 0.9 % IV SOLN
INTRAVENOUS | Status: DC
  Administered 2015-08-12: 01:00:00 via INTRAVENOUS

## 2015-08-11 MED ORDER — ASPIRIN EC 325 MG PO TBEC: 325.0000 mg | DELAYED_RELEASE_TABLET | Freq: Every day | ORAL | Status: DC

## 2015-08-11 NOTE — ED Notes (Signed)
Transported to CT 

## 2015-08-11 NOTE — Telephone Encounter (Signed)
Left message on voicemail per DPR in reference to upcoming appointment scheduled on 08/12/15 at 12:30 with detailed instructions given per Myocardial Perfusion Study Information Sheet for the test. LM to arrive 15 minutes early, and that it is imperative to arrive on time for appointment to keep from having the test rescheduled. If you need to cancel or reschedule your appointment, please call the office within 24 hours of your appointment. Failure to do so may result in a cancellation of your appointment, and a $50 no show fee. Phone number given for call back for any questions. Veronia Beets

## 2015-08-11 NOTE — H&P (Signed)
[facility] History and Physical  Stephanie Gibson ALP:[id number] DOB: October 03, 1966 DOA: 08/11/2015  Referring physician: ED physician PCP: No PCP Per Patient  Specialists:   Chief Complaint: chest pain and heavy menstrual period.  HPI: Stephanie Gibson is a 49 y.o. female with PMH of hypertension, leiomyoma of uterus, anemia due to blood loss, heavy menstrual period, who presents with chest pain.  Patient reports that she has been having chest pain and shortness of breath for nearly one month. She was seen by cardiologist, Dr. Sallyanne Kuster on 06/23/15. She had stress test on 08/08/15, but test was stopped due to poor exercise tolerance and elevated HR per Burtis Junes, ANP-C's note. Her chest pain is located in the frontal chest, radiating to the back, intermittent, 5-8/10 in severity. It is associated with shortness of breath. She also has tenderness over bilateral calf areas during the same period of time time. She reports that her chest pain is getting worse today. She went to her PCPs office, who found that her hemoglobin dropped from 12.0 on 03/25/14 to 8.0 today. She was instructed to come to hospital for further evaluation and treatment. Patient denies cough, abdominal pain, diarrhea, symptoms of UTI, unilateral weakness, fever or chills.  She reports that has has been having heavy menstrual period in the past 3 months. She was seen by OB/Gyn, Dr. Toney Rakes on 06/27/15. Per Dr. Sandrea Hughs note, pt had endometrium biopsy which showed had no HYPERPLASIA, ATYPIA OR MALIGNANCY. She had normal normal pap smear 2015. She had US-pelvis showed intramural fibroid. She was started with Megace for 1 week and then to begin taking a 20 g oral contraception pill daily and have her withdrawal every 3 months.   In ED, patient was found to have WBC 9.4, Hgb 12.0-->7.7, temperature normal, tachycardia, electrolytes okay, negative chest x-ray. CTA of chest showed no evidence of acute PE or aortic  dissection, but showed 4 mm left lower lobe pulmonary nodule.   Where does patient live?   At home   Can patient participate in ADLs?  Yes   Review of Systems:   General: no fevers, chills, no changes in body weight, has fatigue HEENT: no blurry vision, hearing changes or sore throat Pulm: has dyspnea, no coughing, wheezing CV: has chest pain, no palpitations Abd: no nausea, vomiting, abdominal pain, diarrhea, constipation GU: no dysuria, burning on urination, increased urinary frequency, hematuria  Ext: no leg edema Neuro: no unilateral weakness, numbness, or tingling, no vision change or hearing loss Skin: no rash MSK: No muscle spasm, no deformity, no limitation of range of movement in spin Heme: No easy bruising.  Travel history: No recent long distant travel.  Allergy: No Known Allergies  Past Medical History  Diagnosis Date  . Ectopic pregnancy   . Essential hypertension   . Leiomyoma of uterus   . Anemia     Past Surgical History  Procedure Laterality Date  . Cesarean section    . Oophorectomy    . Tubal ligation      Social History:  reports that she has never smoked. She does not have any smokeless tobacco history on file. She reports that she does not drink alcohol or use illicit drugs.  Family History:  Family History  Problem Relation Age of Onset  . Hypertension Mother   . Cancer Father     THROAT      Prior to Admission medications   Medication Sig Start Date End Date Taking? Authorizing Provider  albuterol (PROVENTIL  HFA;VENTOLIN HFA) 108 (90 BASE) MCG/ACT inhaler Inhale 2 puffs into the lungs every 2 (two) hours as needed for wheezing or shortness of breath (or coughing). 6/65/99  Yes Delora Fuel, MD  amLODipine (NORVASC) 5 MG tablet Take 5 mg by mouth daily.   Yes Historical Provider, MD  atenolol (TENORMIN) 25 MG tablet Take 1 tablet (25 mg total) by mouth daily. 06/23/15  Yes Sanda Klein, MD    Physical Exam: Filed Vitals:   08/11/15 2130  08/11/15 2145 08/11/15 2200 08/11/15 2311  BP: 141/71 155/70 150/70 160/68  Pulse: 76 96 81 88  Temp:    98.3 F (36.8 C)  TempSrc:    Oral  Resp: 15 15 17 19   SpO2: 100% 100% 100% 100%   General: Not in acute distress HEENT:       Eyes: PERRL, EOMI, no scleral icterus.       ENT: No discharge from the ears and nose, no pharynx injection, no tonsillar enlargement.        Neck: No JVD, no bruit, no mass felt. Heme: No neck lymph node enlargement. Cardiac: S1/S2, RRR, No murmurs, No gallops or rubs. Pulm: No rales, wheezing, rhonchi or rubs. Abd: Soft, nondistended, nontender, no rebound pain, no organomegaly, BS present. Ext: No pitting leg edema bilaterally. 2+DP/PT pulse bilaterally. Musculoskeletal: No joint deformities, No joint redness or warmth, no limitation of ROM in spin. Skin: No rashes.  Neuro: Alert, oriented X3, cranial nerves II-XII grossly intact, muscle strength 5/5 in all extremities, sensation to light touch intact. Brachial reflex 2+ bilaterally. Knee reflex 1+ bilaterally. Negative Babinski's sign. Normal finger to nose test. Psych: Patient is not psychotic, no suicidal or hemocidal ideation.  Labs on Admission:  Basic Metabolic Panel:  Recent Labs Lab 08/08/15 1632 08/11/15 1641  NA 139 136  K 3.7 3.7  CL 105 104  CO2 21 23  GLUCOSE 92 123*  BUN 8 8  CREATININE 0.66 0.87  CALCIUM 8.9 8.8*   Liver Function Tests:  Recent Labs Lab 08/11/15 1641  AST 24  ALT 23  ALKPHOS 69  BILITOT 0.4  PROT 7.0  ALBUMIN 3.6   No results for input(s): LIPASE, AMYLASE in the last 168 hours. No results for input(s): AMMONIA in the last 168 hours. CBC:  Recent Labs Lab 08/08/15 1632 08/11/15 1642  WBC 8.8 9.4  NEUTROABS 6.3  --   HGB 8.0* 7.7*  HCT 25.0* 25.1*  MCV 74.9* 75.1*  PLT 279 290   Cardiac Enzymes: No results for input(s): CKTOTAL, CKMB, CKMBINDEX, TROPONINI in the last 168 hours.  BNP (last 3 results) No results for input(s): BNP in the  last 8760 hours.  ProBNP (last 3 results) No results for input(s): PROBNP in the last 8760 hours.  CBG: No results for input(s): GLUCAP in the last 168 hours.  Radiological Exams on Admission: Dg Chest 2 View  08/11/2015   CLINICAL DATA:  Chest pain with cough for 3 months. Initial encounter.  EXAM: CHEST  2 VIEW  COMPARISON:  03/25/2014.  FINDINGS: The heart size and mediastinal contours are normal. The lungs are clear. There is no pleural effusion or pneumothorax. No acute osseous findings are identified.  IMPRESSION: Stable chest.  No active cardiopulmonary process.   Electronically Signed   By: Richardean Sale M.D.   On: 08/11/2015 18:36   Ct Angio Chest Pe W/cm &/or Wo Cm  08/11/2015   CLINICAL DATA:  Chest pain.  Short of breath.  Calf pain.  EXAM: CT ANGIOGRAPHY CHEST WITH CONTRAST  TECHNIQUE: Multidetector CT imaging of the chest was performed using the standard protocol during bolus administration of intravenous contrast. Multiplanar CT image reconstructions and MIPs were obtained to evaluate the vascular anatomy.  CONTRAST:  8mL OMNIPAQUE IOHEXOL 350 MG/ML SOLN  COMPARISON:  None.  FINDINGS: There are no filling defects in the pulmonary arterial tree to suggest acute pulmonary thromboembolism.  There is no evidence of aortic dissection or aneurysm.  No evidence of abnormal mediastinal adenopathy.  No pneumothorax.  No pleural effusion  Small visceral pleural node in the right minor fissure on image 40 of series 6. Similar smaller node in the right major fissure on the right on image 49. 4 mm left lower lobe pulmonary parenchymal nodule on image 53.  No vertebral compression deformity.  Review of the MIP images confirms the above findings.  IMPRESSION: No evidence of acute pulmonary thromboembolism  4 mm left lower lobe pulmonary nodule. If the patient is at high risk for bronchogenic carcinoma, follow-up chest CT at 1 year is recommended. If the patient is at low risk, no follow-up is needed.  This recommendation follows the consensus statement: Guidelines for Management of Small Pulmonary Nodules Detected on CT Scans: A Statement from the [place] as published in Radiology 2005; 237:395-400.   Electronically Signed   By: Marybelle Killings M.D.   On: 08/11/2015 23:56    EKG: Independently reviewed. Mild ST depression in V5 to V6, which is new compared with previous EKG on 06/23/15..   Assessment/Plan Principal Problem:   Chest pain Active Problems:   HTN (hypertension)   Intramural leiomyoma of uterus   Anemia due to chronic blood loss   SOB (shortness of breath)  Chest pain: Etiology is not clear. CT angiogram is negative for PE or aortic dissection. She had unsuccessful stress test recently. Will admit for CP rule out though patient has low risk factors for ACS.   - will admit to Tele bed  - cycle CE q6 x3 and repeat her EKG in the am  - Nitroglycerin, Morphine, and lipitor, Atenolol - ASA: 325 mg x 1 and then decreased dose to 81 mg daily due to heavy menstrual period. - Risk factor stratification: will check FLP and A1C  - Consider cardiology consult if test positive for CEs  - 2d echo - Protonix for possible acid reflux - LE venous doppler to r/u DVT  HTN: - Amlodipine and Atenolol - IV hydralazine when necessary  Symptomatic anemia: Hemoglobin dropped from 12.0-7.7. She has chest pain shortness of breath, which may be partially induced by anemia. This is most likely due to blood loss secondary to heavy menstrual period. -1U of blood was ordered by ED - cbc q6h - continue iron supplements. - Anemia panel - INR/PTT/type & screen  Heavy menstrual period secondary to leiomyoma of uterus: She had US-pelvis on 06/27/15, which showed an intramural fibroid measuring 43 x 38 x 43 mm, right adnexa negative and left ovary normal. Hgb dropped from 12.0 on 03/25/14 to 7.7 today. She is taking contraception pill currently, which seems not help  -will not continue OCP due  to possible DVT -will f/u LE doppler. If negative, will resume her OCP -may consult to Ob/gyn in AM -cbc q6h -check preg test.   DVT ppx: I did not start SCD or heparin DVT PPx now. Patient has heavy menstrual periods, making heparin inappropriate for DVT prophylaxis. Patient has bilateral calf pain, cannot rule out DVT, therefore  SCD cannot be applied now. Will need to start SCD as soon as LE venous doppler is negative for DVT.   Code Status: Full code Family Communication: Yes, patient's husband at bed side Disposition Plan: Admit to inpatient   Date of Service 08/12/2015    Ivor Costa [facility] Pager [phone]  If 7PM-7AM, please contact night-coverage [email or web address] Password [email or web address] 08/12/2015, 12:04 AM

## 2015-08-11 NOTE — Telephone Encounter (Signed)
Left message on voicemail per DPR in reference to upcoming appointment scheduled on 08/11/15 at 1230 with detailed instructions given per Myocardial Perfusion Study Information Sheet for the test. LM to arrive 15 minutes early, and that it is imperative to arrive on time for appointment to keep from having the test rescheduled. If you need to cancel or reschedule your appointment, please call the office within 24 hours of your appointment. Failure to do so may result in a cancellation of your appointment, and a $50 no show fee. Phone number given for call back for any questions. Lyman Balingit, Ranae Palms

## 2015-08-11 NOTE — ED Notes (Signed)
Used translator phone, pt having chest pains and sob for days. Went to clinic today and sent here for blood transfusion due to hgb of 8.

## 2015-08-11 NOTE — ED Provider Notes (Signed)
CSN: [id number]     Arrival date & time 08/11/15  1608 History   First MD Initiated Contact with Patient 08/11/15 1900     Chief Complaint  Patient presents with  . Chest Pain  . Shortness of Breath     (Consider location/radiation/quality/duration/timing/severity/associated sxs/prior Treatment) Patient is a 49 y.o. female presenting with chest pain and shortness of breath. The history is provided by the patient. The history is limited by a language barrier. A language interpreter was used.  Chest Pain Associated symptoms: back pain, cough, fatigue, shortness of breath and weakness   Associated symptoms: no fever, no nausea and not vomiting   Shortness of Breath Associated symptoms: chest pain and cough   Associated symptoms: no fever and no vomiting    Stephanie Gibson is a 49 y.o. female with PMH significant for ectopic pregnancy who presents with low hemoglobin.  Patient states that she has been having menstrual bleeding since July and has been seeing her Gynecologist for this problem.  She states they they try medicine and she will stop for a little bit, but then it comes right back.  She states she uses 7 pad/day and passes clots as well.  She has been experiencing chest pain and shortness of breath for the past couple of days.  Endorses DOE, fatigue, weakness, and back pain.  Denies fevers, chills, bloody stools, hematuria, or dysuria.    Per her PCP, her hgb was 8 on Friday and she is now experiencing DOE and CP on exertion.  She was told to come to the ED for transfusion.   Past Medical History  Diagnosis Date  . Ectopic pregnancy    Past Surgical History  Procedure Laterality Date  . Cesarean section    . Oophorectomy    . Tubal ligation     Family History  Problem Relation Age of Onset  . Hypertension Mother   . Cancer Father     THROAT    Social History  Substance Use Topics  . Smoking status: Never Smoker   . Smokeless tobacco: None  . Alcohol Use: No   OB  History    Gravida Para Term Preterm AB TAB SAB Ectopic Multiple Living   4 2   2  1 1  2      Review of Systems  Constitutional: Positive for chills and fatigue. Negative for fever.  Respiratory: Positive for cough and shortness of breath.   Cardiovascular: Positive for chest pain.  Gastrointestinal: Negative for nausea, vomiting, diarrhea and blood in stool.  Genitourinary: Positive for menstrual problem. Negative for dysuria, frequency and hematuria.  Musculoskeletal: Positive for back pain.  Neurological: Positive for weakness and light-headedness.      Allergies  Review of patient's allergies indicates no known allergies.  Home Medications   Prior to Admission medications   Medication Sig Start Date End Date Taking? Authorizing Provider  albuterol (PROVENTIL HFA;VENTOLIN HFA) 108 (90 BASE) MCG/ACT inhaler Inhale 2 puffs into the lungs every 2 (two) hours as needed for wheezing or shortness of breath (or coughing). 5/32/99  Yes Delora Fuel, MD  amLODipine (NORVASC) 5 MG tablet Take 5 mg by mouth daily.   Yes Historical Provider, MD  atenolol (TENORMIN) 25 MG tablet Take 1 tablet (25 mg total) by mouth daily. 06/23/15  Yes Mihai Croitoru, MD   BP 134/70 mmHg  Pulse 82  Temp(Src) 98.6 F (37 C) (Oral)  Resp 13  SpO2 100% Physical Exam  Constitutional: She is oriented to  person, place, and time. She appears well-developed and well-nourished.  HENT:  Head: Normocephalic and atraumatic.  Mouth/Throat: Oropharynx is clear and moist.  Eyes: Conjunctivae are normal. Pupils are equal, round, and reactive to light.  Neck: Normal range of motion. Neck supple.  Cardiovascular: Regular rhythm, normal heart sounds and intact distal pulses.  Tachycardia present.   No murmur heard. Pulmonary/Chest: Effort normal and breath sounds normal. No respiratory distress. She has no wheezes. She has no rales.  Abdominal: Soft. Bowel sounds are normal. She exhibits no distension. There is no  tenderness.  Musculoskeletal: Normal range of motion.  Lymphadenopathy:    She has no cervical adenopathy.  Neurological: She is alert and oriented to person, place, and time.  Skin: Skin is warm and dry.  Psychiatric: She has a normal mood and affect. Her behavior is normal.    ED Course  Procedures (including critical care time) Labs Review Labs Reviewed  CBC - Abnormal; Notable for the following:    RBC 3.34 (*)    Hemoglobin 7.7 (*)    HCT 25.1 (*)    MCV 75.1 (*)    MCH 23.1 (*)    RDW 15.9 (*)    All other components within normal limits  COMPREHENSIVE METABOLIC PANEL - Abnormal; Notable for the following:    Glucose, Bld 123 (*)    Calcium 8.8 (*)    All other components within normal limits  I-STAT TROPOININ, ED  POC OCCULT BLOOD, ED  TYPE AND SCREEN  ABO/RH  PREPARE RBC (CROSSMATCH)    Imaging Review Dg Chest 2 View  08/11/2015   CLINICAL DATA:  Chest pain with cough for 3 months. Initial encounter.  EXAM: CHEST  2 VIEW  COMPARISON:  03/25/2014.  FINDINGS: The heart size and mediastinal contours are normal. The lungs are clear. There is no pleural effusion or pneumothorax. No acute osseous findings are identified.  IMPRESSION: Stable chest.  No active cardiopulmonary process.   Electronically Signed   By: Richardean Sale M.D.   On: 08/11/2015 18:36   I have personally reviewed and evaluated these images and lab results as part of my medical decision-making.   EKG Interpretation   Date/Time:  Monday August 11 2015 16:18:18 EDT Ventricular Rate:  97 PR Interval:  154 QRS Duration: 86 QT Interval:  356 QTC Calculation: 452 R Axis:   78 Text Interpretation:  Normal sinus rhythm Possible Left atrial enlargement  Nonspecific ST and T wave abnormality Abnormal ECG Confirmed by Alvino Chapel   MD, Ovid Curd [phone]) on 08/11/2015 7:34:51 PM      MDM   Final diagnoses:  Anemia, unspecified anemia type    Patient presents with anemia that has now become symptomatic.   Endorses DOE, CP on exertion, and SOB.  Sent here from PCP.  VSS, NAD, appear non-toxic.  On exam, tachycardia present.  Lungs CTAB. Abdomen soft, nontender.  Distal pulses intact.   -Hgb 7.7, type and screen A positive -CMP unremarkable -CXR shows no active cardiopulmonary disease.  9:15 PM: Admit for transfusion. Per hospitalist, will begin 1 unit in ED.    Gloriann Loan, PA-C 08/11/15 2114  Davonna Belling, MD 08/13/15 0030

## 2015-08-12 ENCOUNTER — Encounter (HOSPITAL_COMMUNITY): Payer: 59

## 2015-08-12 ENCOUNTER — Ambulatory Visit (HOSPITAL_BASED_OUTPATIENT_CLINIC_OR_DEPARTMENT_OTHER): Payer: 59

## 2015-08-12 DIAGNOSIS — D500 Iron deficiency anemia secondary to blood loss (chronic): Secondary | ICD-10-CM

## 2015-08-12 DIAGNOSIS — R079 Chest pain, unspecified: Secondary | ICD-10-CM

## 2015-08-12 DIAGNOSIS — I10 Essential (primary) hypertension: Secondary | ICD-10-CM

## 2015-08-12 DIAGNOSIS — R0602 Shortness of breath: Secondary | ICD-10-CM

## 2015-08-12 DIAGNOSIS — Z79899 Other long term (current) drug therapy: Secondary | ICD-10-CM | POA: Diagnosis not present

## 2015-08-12 DIAGNOSIS — R072 Precordial pain: Secondary | ICD-10-CM

## 2015-08-12 DIAGNOSIS — D251 Intramural leiomyoma of uterus: Secondary | ICD-10-CM | POA: Diagnosis not present

## 2015-08-12 LAB — IRON AND TIBC
Iron: 13 ug/dL — ABNORMAL LOW (ref 28–170)
SATURATION RATIOS: 3 % — AB (ref 10.4–31.8)
TIBC: 448 ug/dL (ref 250–450)
UIBC: 435 ug/dL

## 2015-08-12 LAB — COMPREHENSIVE METABOLIC PANEL
ALT: 22 U/L (ref 14–54)
AST: 23 U/L (ref 15–41)
Albumin: 3.4 g/dL — ABNORMAL LOW (ref 3.5–5.0)
Alkaline Phosphatase: 69 U/L (ref 38–126)
Anion gap: 9 (ref 5–15)
BUN: 6 mg/dL (ref 6–20)
CHLORIDE: 103 mmol/L (ref 101–111)
CO2: 24 mmol/L (ref 22–32)
Calcium: 8.6 mg/dL — ABNORMAL LOW (ref 8.9–10.3)
Creatinine, Ser: 0.78 mg/dL (ref 0.44–1.00)
Glucose, Bld: 112 mg/dL — ABNORMAL HIGH (ref 65–99)
POTASSIUM: 3.6 mmol/L (ref 3.5–5.1)
Sodium: 136 mmol/L (ref 135–145)
Total Bilirubin: 0.4 mg/dL (ref 0.3–1.2)
Total Protein: 6.5 g/dL (ref 6.5–8.1)

## 2015-08-12 LAB — CBC
HEMATOCRIT: 24.6 % — AB (ref 36.0–46.0)
HEMATOCRIT: 27.2 % — AB (ref 36.0–46.0)
HEMATOCRIT: 28.4 % — AB (ref 36.0–46.0)
HEMATOCRIT: 28.7 % — AB (ref 36.0–46.0)
HEMOGLOBIN: 8.5 g/dL — AB (ref 12.0–15.0)
Hemoglobin: 8 g/dL — ABNORMAL LOW (ref 12.0–15.0)
Hemoglobin: 8.8 g/dL — ABNORMAL LOW (ref 12.0–15.0)
Hemoglobin: 8.9 g/dL — ABNORMAL LOW (ref 12.0–15.0)
MCH: 23.4 pg — AB (ref 26.0–34.0)
MCH: 23.5 pg — AB (ref 26.0–34.0)
MCH: 23.7 pg — AB (ref 26.0–34.0)
MCH: 24.5 pg — ABNORMAL LOW (ref 26.0–34.0)
MCHC: 31 g/dL (ref 30.0–36.0)
MCHC: 31 g/dL (ref 30.0–36.0)
MCHC: 31.3 g/dL (ref 30.0–36.0)
MCHC: 32.5 g/dL (ref 30.0–36.0)
MCV: 75.2 fL — AB (ref 78.0–100.0)
MCV: 75.3 fL — ABNORMAL LOW (ref 78.0–100.0)
MCV: 75.9 fL — AB (ref 78.0–100.0)
MCV: 76 fL — ABNORMAL LOW (ref 78.0–100.0)
PLATELETS: 254 10*3/uL (ref 150–400)
PLATELETS: 264 10*3/uL (ref 150–400)
Platelets: 269 10*3/uL (ref 150–400)
Platelets: 268 10*3/uL (ref 150–400)
RBC: 3.27 MIL/uL — AB (ref 3.87–5.11)
RBC: 3.58 MIL/uL — ABNORMAL LOW (ref 3.87–5.11)
RBC: 3.74 MIL/uL — AB (ref 3.87–5.11)
RBC: 3.81 MIL/uL — ABNORMAL LOW (ref 3.87–5.11)
RDW: 15.6 % — ABNORMAL HIGH (ref 11.5–15.5)
RDW: 15.7 % — AB (ref 11.5–15.5)
RDW: 15.7 % — ABNORMAL HIGH (ref 11.5–15.5)
RDW: 16 % — ABNORMAL HIGH (ref 11.5–15.5)
WBC: 6.7 10*3/uL (ref 4.0–10.5)
WBC: 7 10*3/uL (ref 4.0–10.5)
WBC: 7.2 10*3/uL (ref 4.0–10.5)
WBC: 7.5 10*3/uL (ref 4.0–10.5)

## 2015-08-12 LAB — FERRITIN: Ferritin: 4 ng/mL — ABNORMAL LOW (ref 11–307)

## 2015-08-12 LAB — TROPONIN I

## 2015-08-12 LAB — RETICULOCYTES
RBC.: 3.27 MIL/uL — AB (ref 3.87–5.11)
RETIC CT PCT: 2.3 % (ref 0.4–3.1)
Retic Count, Absolute: 75.2 10*3/uL (ref 19.0–186.0)

## 2015-08-12 LAB — GLUCOSE, CAPILLARY: GLUCOSE-CAPILLARY: 100 mg/dL — AB (ref 65–99)

## 2015-08-12 LAB — LIPID PANEL
Cholesterol: 160 mg/dL (ref 0–200)
HDL: 37 mg/dL — ABNORMAL LOW (ref >40)
LDL CALC: 106 mg/dL — AB (ref 0–99)
Total CHOL/HDL Ratio: 4.3 RATIO
Triglycerides: 87 mg/dL (ref <150)
VLDL: 17 mg/dL (ref 0–40)

## 2015-08-12 LAB — PROTIME-INR
INR: 1.25 (ref 0.00–1.49)
Prothrombin Time: 15.9 seconds — ABNORMAL HIGH (ref 11.6–15.2)

## 2015-08-12 LAB — VITAMIN B12: VITAMIN B 12: 271 pg/mL (ref 180–914)

## 2015-08-12 LAB — APTT: APTT: 28 s (ref 24–37)

## 2015-08-12 LAB — HCG, QUANTITATIVE, PREGNANCY: hCG, Beta Chain, Quant, S: <1 m[IU]/mL (ref <5)

## 2015-08-12 LAB — FOLATE: Folate: 18.5 ng/mL (ref >5.9)

## 2015-08-12 MED ORDER — KCL IN DEXTROSE-NACL 20-5-0.45 MEQ/L-%-% IV SOLN
INTRAVENOUS | Status: DC
  Administered 2015-08-12 – 2015-08-13 (×3): via INTRAVENOUS
  Filled 2015-08-12 (×3): qty 1000

## 2015-08-12 MED ORDER — PANTOPRAZOLE SODIUM 40 MG PO TBEC
40.0000 mg | DELAYED_RELEASE_TABLET | Freq: Every day | ORAL | Status: DC
Start: 2015-08-12 — End: 2015-08-13
  Administered 2015-08-12 – 2015-08-13 (×2): 40 mg via ORAL
  Filled 2015-08-12 (×3): qty 1

## 2015-08-12 MED ORDER — FERROUS SULFATE 325 (65 FE) MG PO TABS
325.0000 mg | ORAL_TABLET | Freq: Three times a day (TID) | ORAL | Status: DC
  Administered 2015-08-12 – 2015-08-13 (×5): 325 mg via ORAL
  Filled 2015-08-12 (×5): qty 1

## 2015-08-12 MED ORDER — MEGESTROL ACETATE 40 MG PO TABS
40.0000 mg | ORAL_TABLET | Freq: Two times a day (BID) | ORAL | Status: DC
  Administered 2015-08-12 – 2015-08-13 (×2): 40 mg via ORAL
  Filled 2015-08-12 (×4): qty 1

## 2015-08-12 MED ORDER — ALBUTEROL SULFATE (2.5 MG/3ML) 0.083% IN NEBU: 2.5000 mg | INHALATION_SOLUTION | RESPIRATORY_TRACT | Status: DC | PRN

## 2015-08-12 MED ORDER — ASPIRIN EC 81 MG PO TBEC
81.0000 mg | DELAYED_RELEASE_TABLET | Freq: Every day | ORAL | Status: DC
  Administered 2015-08-12 – 2015-08-13 (×2): 81 mg via ORAL
  Filled 2015-08-12 (×3): qty 1

## 2015-08-12 NOTE — Progress Notes (Addendum)
[facility] PROGRESS NOTE  Stephanie Gibson EPP:[id number] DOB: 03/08/1966 DOA: 08/11/2015 PCP: No PCP Per Patient   Brief narrative: Patient is a 49 year old [place] female who presented complaining of chest pain and vaginal bleeding. Patient has history intramural leiomyoma of uterus. Pt was set up for lexiscan as outpatient with cardiology but given admission and chest pain. Cardiology consulted. Case also discussed with GYN physician. Please see below.  Assessment/Plan:  Principal Problem:   Chest pain - Cardiology consulted and plans are for Lexiscan next am. - Troponins negative - currently denies any chest pain.    Intramural leiomyoma of uterus/Anemia due to chronic blood loss - Discussed with [facility] physician and plan is for patient to be seen at the office for placement of IUD. Currently patient is not bleeding - We'll place on Megace 40 mg by mouth twice a day which patient is to take for the next 10 days and she is to follow-up with her GYN physician at the office for IUD placement  Active Problems:   HTN (hypertension) - stable on atenolol and amlodipine    SOB (shortness of breath) - resolved and most likely due to anemia  Code Status: full Family Communication: d/c pt and family Disposition Plan: If cardiology work up negative. D/c patient with plans for her to follow up on the same day with her GYN physician who plans on placing IUD.   Consultants:  Cardiology  Called and discussed with GYN physician.  Procedures:  None  Antibiotics:  None  HPI/Subjective: Patient has no new complaints. No acute issues overnight. States that her chest pain has ceased. Reports that her bleeding has stopped as well.  Objective: Filed Vitals:   08/12/15 1125  BP: 116/66  Pulse:   Temp:   Resp:     Intake/Output Summary (Last 24 hours) at 08/12/15 1520 Last data filed at 08/12/15 0730  Gross per 24 hour  Intake    335 ml  Output      0 ml  Net    335 ml    Filed Weights   08/12/15 0024  Weight: 80.241 kg (176 lb 14.4 oz)    Exam:   General:  Pt in nad, alert and awake  Cardiovascular: rrr, no rubs  Respiratory: cta bl, no wheezes  Abdomen: soft, NT, ND  Musculoskeletal: no cyanosis or clubbing   Data Reviewed: Basic Metabolic Panel:  Recent Labs Lab 08/08/15 1632 08/11/15 1641 08/12/15 0107  NA 139 136 136  K 3.7 3.7 3.6  CL 105 104 103  CO2 21 23 24   GLUCOSE 92 123* 112*  BUN 8 8 6   CREATININE 0.66 0.87 0.78  CALCIUM 8.9 8.8* 8.6*   Liver Function Tests:  Recent Labs Lab 08/11/15 1641 08/12/15 0107  AST 24 23  ALT 23 22  ALKPHOS 69 69  BILITOT 0.4 0.4  PROT 7.0 6.5  ALBUMIN 3.6 3.4*   No results for input(s): LIPASE, AMYLASE in the last 168 hours. No results for input(s): AMMONIA in the last 168 hours. CBC:  Recent Labs Lab 08/08/15 1632 08/11/15 1642 08/12/15 0043 08/12/15 0626 08/12/15 1220  WBC 8.8 9.4 7.5 7.2 7.0  NEUTROABS 6.3  --   --   --   --   HGB 8.0* 7.7* 8.0* 8.5* 8.8*  HCT 25.0* 25.1* 24.6* 27.2* 28.4*  MCV 74.9* 75.1* 75.2* 76.0* 75.9*  PLT 279 290 264 269 254   Cardiac Enzymes:  Recent Labs Lab 08/12/15 0043 08/12/15 0626 08/12/15 1043  TROPONINI <0.03 <0.03 <0.03   BNP (last 3 results) No results for input(s): BNP in the last 8760 hours.  ProBNP (last 3 results) No results for input(s): PROBNP in the last 8760 hours.  CBG:  Recent Labs Lab 08/12/15 0644  GLUCAP 100*    No results found for this or any previous visit (from the past 240 hour(s)).   Studies: Dg Chest 2 View  08/11/2015   CLINICAL DATA:  Chest pain with cough for 3 months. Initial encounter.  EXAM: CHEST  2 VIEW  COMPARISON:  03/25/2014.  FINDINGS: The heart size and mediastinal contours are normal. The lungs are clear. There is no pleural effusion or pneumothorax. No acute osseous findings are identified.  IMPRESSION: Stable chest.  No active cardiopulmonary process.   Electronically Signed    By: Richardean Sale M.D.   On: 08/11/2015 18:36   Ct Angio Chest Pe W/cm &/or Wo Cm  08/11/2015   CLINICAL DATA:  Chest pain.  Short of breath.  Calf pain.  EXAM: CT ANGIOGRAPHY CHEST WITH CONTRAST  TECHNIQUE: Multidetector CT imaging of the chest was performed using the standard protocol during bolus administration of intravenous contrast. Multiplanar CT image reconstructions and MIPs were obtained to evaluate the vascular anatomy.  CONTRAST:  55mL OMNIPAQUE IOHEXOL 350 MG/ML SOLN  COMPARISON:  None.  FINDINGS: There are no filling defects in the pulmonary arterial tree to suggest acute pulmonary thromboembolism.  There is no evidence of aortic dissection or aneurysm.  No evidence of abnormal mediastinal adenopathy.  No pneumothorax.  No pleural effusion  Small visceral pleural node in the right minor fissure on image 40 of series 6. Similar smaller node in the right major fissure on the right on image 49. 4 mm left lower lobe pulmonary parenchymal nodule on image 53.  No vertebral compression deformity.  Review of the MIP images confirms the above findings.  IMPRESSION: No evidence of acute pulmonary thromboembolism  4 mm left lower lobe pulmonary nodule. If the patient is at high risk for bronchogenic carcinoma, follow-up chest CT at 1 year is recommended. If the patient is at low risk, no follow-up is needed. This recommendation follows the consensus statement: Guidelines for Management of Small Pulmonary Nodules Detected on CT Scans: A Statement from the [place] as published in Radiology 2005; 237:395-400.   Electronically Signed   By: Marybelle Killings M.D.   On: 08/11/2015 23:56    Scheduled Meds: . amLODipine  5 mg Oral Daily  . aspirin EC  81 mg Oral Daily  . atenolol  25 mg Oral Daily  . ferrous sulfate  325 mg Oral TID WC  . pantoprazole  40 mg Oral Q1200  . sodium chloride  3 mL Intravenous Q12H   Continuous Infusions: . dextrose 5 % and 0.45 % NaCl with KCl 20 mEq/L 100 mL/hr at  08/12/15 1126    Time spent: > 35 minutes   Velvet Bathe  [facility] Pager [phone] If 7PM-7AM, please contact night-coverage at [email or web address], password [facility] 08/12/2015, 3:20 PM  LOS: 1 day

## 2015-08-12 NOTE — Progress Notes (Signed)
Utilization review completed.  

## 2015-08-12 NOTE — Progress Notes (Signed)
*  Preliminary Results* Bilateral lower extremity venous duplex completed. Bilateral lower extremities are negative for deep vein thrombosis. There is no evidence of Baker's cyst bilaterally.  08/12/2015 2:14 PM  Maudry Mayhew, RVT, RDCS, RDMS

## 2015-08-12 NOTE — Progress Notes (Signed)
  Echocardiogram 2D Echocardiogram has been performed.  Stephanie Gibson M 08/12/2015, 4:01 PM

## 2015-08-12 NOTE — Consult Note (Signed)
CARDIOLOGY CONSULT NOTE   Patient ID: Oney Tatlock MRN: [id number], DOB/AGE: [birth year]   Admit date: 08/11/2015 Date of Consult: 08/12/2015   Primary Physician: No PCP Per Patient Primary Cardiologist: Dr. Sallyanne Kuster  Pt. Profile  49 year old [place] female with past medical history of hypertension, leiomyoma of uterus and anemia due to blood loss presented with significant anemia  Problem List  Past Medical History  Diagnosis Date  . Ectopic pregnancy   . Essential hypertension   . Leiomyoma of uterus   . Anemia     Past Surgical History  Procedure Laterality Date  . Cesarean section    . Oophorectomy    . Tubal ligation       Allergies  No Known Allergies  HPI   The patient is a 49 year old [place] female with past medical history of hypertension, leiomyoma of uterus and anemia due to blood loss. Per patient, she has been having significant amount of heavy menstrual period since July of this year. For the past 2 months, she also started having exertional symptoms including chest pain and shortness of breath. Per patient, she also have this atypical cough that typically occur when she first wakes up in the morning, however also tender occur after eating food. This is accompanied with white phlegm. She denies significant lower extremity edema or orthopnea. She denies any recent fever or chill. She was seen in the office on 06/23/2015, outpatient Myoview was recommended, unfortunately she could not complete the treadmill stress test thus making it nondiagnostic. She had an outpatient echocardiogram on 08/08/2015 which showed EF 55-60%, no regional wall motion abnormalities. She went to her PCPs office, who found her hemoglobin has dropped from previous 12 in May down to 8.0. He was instructed to go to the hospital for further evaluation. Patient has been admitted to the internal medicine service.   On arrival, her vital signs stable. O2 saturation 100% on room air. Despite  claiming to have atypical productive cough for the past several months, patient's chest x-ray is negative for acute process. Initial hemoglobin was 7.7. Troponin negative. CTA of the chest showed no evidence of acute PE or dissection, however did note a 4 mm left lower lobe pulmonary nodule. She has normal TIBC, iron level 13 markedly low. Initial EKG showed normal sinus rhythm without significant ST-T wave changes. Cardiology has been consulted for chest pain.   Inpatient Medications  . amLODipine  5 mg Oral Daily  . aspirin EC  81 mg Oral Daily  . atenolol  25 mg Oral Daily  . ferrous sulfate  325 mg Oral TID WC  . pantoprazole  40 mg Oral Q1200  . sodium chloride  3 mL Intravenous Q12H    Family History Family History  Problem Relation Age of Onset  . Hypertension Mother   . Cancer Father     THROAT      Social History Social History   Social History  . Marital Status: Married    Spouse Name: N/A  . Number of Children: N/A  . Years of Education: N/A   Occupational History  . Not on file.   Social History Main Topics  . Smoking status: Never Smoker   . Smokeless tobacco: Not on file  . Alcohol Use: No  . Drug Use: No  . Sexual Activity: Yes    Birth Control/ Protection: None     Comment: INTERCOURSE AGE 55, SEXUAL PARTNERS LESS THAN 5   Other Topics Concern  . Not on  file   Social History Narrative     Review of Systems  General:  No chills, fever, night sweats or weight changes.  Cardiovascular:  No edema, orthopnea, palpitations, paroxysmal nocturnal dyspnea. +chest pain, dyspnea on exertion Dermatological: No rash, lesions/masses Respiratory: No dyspnea +cough Urologic: No hematuria, dysuria Abdominal:   No nausea, vomiting, diarrhea, bright red blood per rectum, melena, or hematemesis +heavy menstrual period Neurologic:  No visual changes, wkns, changes in mental status. All other systems reviewed and are otherwise negative except as noted  above.  Physical Exam  Blood pressure 116/66, pulse 62, temperature 98.6 F (37 C), temperature source Oral, resp. rate 18, height 5\' 5"  (1.651 m), weight 176 lb 14.4 oz (80.241 kg), last menstrual period 08/10/2015, SpO2 100 %.  General: Pleasant, NAD Psych: Normal affect. Neuro: Alert and oriented X 3. Moves all extremities spontaneously. HEENT: Normal  Neck: Supple without bruits or JVD. Lungs:  Resp regular and unlabored, CTA. Heart: RRR no s3, s4, or murmurs. Abdomen: Soft, non-tender, non-distended, BS + x 4.  Extremities: No clubbing, cyanosis or edema. DP/PT/Radials 2+ and equal bilaterally.  Labs   Recent Labs  08/12/15 0043 08/12/15 0626 08/12/15 1043  TROPONINI <0.03 <0.03 <0.03   Lab Results  Component Value Date   WBC 7.2 08/12/2015   HGB 8.5* 08/12/2015   HCT 27.2* 08/12/2015   MCV 76.0* 08/12/2015   PLT 269 08/12/2015     Recent Labs Lab 08/12/15 0107  NA 136  K 3.6  CL 103  CO2 24  BUN 6  CREATININE 0.78  CALCIUM 8.6*  PROT 6.5  BILITOT 0.4  ALKPHOS 69  ALT 22  AST 23  GLUCOSE 112*   Lab Results  Component Value Date   CHOL 160 08/12/2015   HDL 37* 08/12/2015   LDLCALC 106* 08/12/2015   TRIG 87 08/12/2015   No results found for: DDIMER  Radiology/Studies  Dg Chest 2 View  08/11/2015   CLINICAL DATA:  Chest pain with cough for 3 months. Initial encounter.  EXAM: CHEST  2 VIEW  COMPARISON:  03/25/2014.  FINDINGS: The heart size and mediastinal contours are normal. The lungs are clear. There is no pleural effusion or pneumothorax. No acute osseous findings are identified.  IMPRESSION: Stable chest.  No active cardiopulmonary process.   Electronically Signed   By: Richardean Sale M.D.   On: 08/11/2015 18:36   Ct Angio Chest Pe W/cm &/or Wo Cm  08/11/2015   CLINICAL DATA:  Chest pain.  Short of breath.  Calf pain.  EXAM: CT ANGIOGRAPHY CHEST WITH CONTRAST  TECHNIQUE: Multidetector CT imaging of the chest was performed using the standard  protocol during bolus administration of intravenous contrast. Multiplanar CT image reconstructions and MIPs were obtained to evaluate the vascular anatomy.  CONTRAST:  12mL OMNIPAQUE IOHEXOL 350 MG/ML SOLN  COMPARISON:  None.  FINDINGS: There are no filling defects in the pulmonary arterial tree to suggest acute pulmonary thromboembolism.  There is no evidence of aortic dissection or aneurysm.  No evidence of abnormal mediastinal adenopathy.  No pneumothorax.  No pleural effusion  Small visceral pleural node in the right minor fissure on image 40 of series 6. Similar smaller node in the right major fissure on the right on image 49. 4 mm left lower lobe pulmonary parenchymal nodule on image 53.  No vertebral compression deformity.  Review of the MIP images confirms the above findings.  IMPRESSION: No evidence of acute pulmonary thromboembolism  4 mm left lower  lobe pulmonary nodule. If the patient is at high risk for bronchogenic carcinoma, follow-up chest CT at 1 year is recommended. If the patient is at low risk, no follow-up is needed. This recommendation follows the consensus statement: Guidelines for Management of Small Pulmonary Nodules Detected on CT Scans: A Statement from the [place] as published in Radiology 2005; 237:395-400.   Electronically Signed   By: Marybelle Killings M.D.   On: 08/11/2015 23:56    ECG  Normal sinus rhythm without significant ST-T wave changes.  ASSESSMENT AND PLAN  1. Atypical chest pain, happen with exertion, likely related to significant anemia  - CTA negative for PE or dissection  - 08/08/2015 EF 55-60%, no regional wall motion abnormalities  - Serial troponin negative, plan for Lexiscan Myoview in AM., If Myoview normal, would not pursue further workup except focus on treating underlying anemia.  2. Significant anemia with heavy menstrual period  - per primary team  3. Productive cough, negative CXR, WBC normal. Euvolemic on exam, no sign of HF  4.  HTN  5. LLL pulm nodule noted on CTA of chest- per primary service  Signed, Almyra Deforest, PA-C 08/12/2015, 12:25 PM  I have seen and examined the patient along with Almyra Deforest, PA-C.  I have reviewed the chart, notes and new data.  I agree with PA/NP's note.  It is quite likely that her CV symptoms are anemia related. Lexiscan Myoview tomorrow. If normal, no other CV workup is planned.   Sanda Klein, MD, [place] [phone] 08/12/2015, 12:48 PM

## 2015-08-12 NOTE — Progress Notes (Addendum)
Patient lying in bed, no distress. Family at bedside, call light within reach. SCD's applied

## 2015-08-13 ENCOUNTER — Observation Stay (HOSPITAL_COMMUNITY): Payer: 59

## 2015-08-13 ENCOUNTER — Ambulatory Visit: Payer: 59 | Admitting: Gynecology

## 2015-08-13 ENCOUNTER — Observation Stay (HOSPITAL_BASED_OUTPATIENT_CLINIC_OR_DEPARTMENT_OTHER): Payer: 59

## 2015-08-13 DIAGNOSIS — R079 Chest pain, unspecified: Secondary | ICD-10-CM

## 2015-08-13 DIAGNOSIS — D509 Iron deficiency anemia, unspecified: Secondary | ICD-10-CM | POA: Diagnosis not present

## 2015-08-13 DIAGNOSIS — I10 Essential (primary) hypertension: Secondary | ICD-10-CM | POA: Diagnosis not present

## 2015-08-13 DIAGNOSIS — R072 Precordial pain: Secondary | ICD-10-CM | POA: Diagnosis not present

## 2015-08-13 DIAGNOSIS — D251 Intramural leiomyoma of uterus: Secondary | ICD-10-CM

## 2015-08-13 DIAGNOSIS — D500 Iron deficiency anemia secondary to blood loss (chronic): Secondary | ICD-10-CM | POA: Diagnosis not present

## 2015-08-13 LAB — CBC
HCT: 26.8 % — ABNORMAL LOW (ref 36.0–46.0)
HCT: 26.5 % — ABNORMAL LOW (ref 36.0–46.0)
HEMATOCRIT: 27.3 % — AB (ref 36.0–46.0)
HEMOGLOBIN: 8.4 g/dL — AB (ref 12.0–15.0)
HEMOGLOBIN: 8.6 g/dL — AB (ref 12.0–15.0)
Hemoglobin: 8.2 g/dL — ABNORMAL LOW (ref 12.0–15.0)
MCH: 23.1 pg — ABNORMAL LOW (ref 26.0–34.0)
MCH: 23.4 pg — ABNORMAL LOW (ref 26.0–34.0)
MCH: 24.3 pg — AB (ref 26.0–34.0)
MCHC: 30.8 g/dL (ref 30.0–36.0)
MCHC: 30.9 g/dL (ref 30.0–36.0)
MCHC: 32.1 g/dL (ref 30.0–36.0)
MCV: 75.2 fL — ABNORMAL LOW (ref 78.0–100.0)
MCV: 75.5 fL — ABNORMAL LOW (ref 78.0–100.0)
MCV: 75.7 fL — ABNORMAL LOW (ref 78.0–100.0)
PLATELETS: 232 10*3/uL (ref 150–400)
PLATELETS: 241 10*3/uL (ref 150–400)
Platelets: 258 10*3/uL (ref 150–400)
RBC: 3.51 MIL/uL — AB (ref 3.87–5.11)
RBC: 3.54 MIL/uL — ABNORMAL LOW (ref 3.87–5.11)
RBC: 3.63 MIL/uL — AB (ref 3.87–5.11)
RDW: 15.6 % — ABNORMAL HIGH (ref 11.5–15.5)
RDW: 15.7 % — AB (ref 11.5–15.5)
RDW: 15.8 % — AB (ref 11.5–15.5)
WBC: 6.6 10*3/uL (ref 4.0–10.5)
WBC: 6.7 10*3/uL (ref 4.0–10.5)
WBC: 7.3 10*3/uL (ref 4.0–10.5)

## 2015-08-13 LAB — TYPE AND SCREEN
ABO/RH(D): A POS
ANTIBODY SCREEN: NEGATIVE
Unit division: 0

## 2015-08-13 LAB — NM MYOCAR MULTI W/SPECT W/WALL MOTION / EF
CHL CUP MPHR: 170 {beats}/min
CHL CUP NUCLEAR SDS: 4
CSEPEW: 1 METS
CSEPHR: 63 %
CSEPPHR: 108 {beats}/min
Exercise duration (min): 5 min
LHR: 0
LV sys vol: 28 mL
LVDIAVOL: 77 mL
NUC STRESS TID: 1.82
Rest HR: 62 {beats}/min
SRS: 6
SSS: 10

## 2015-08-13 LAB — GLUCOSE, CAPILLARY: Glucose-Capillary: 113 mg/dL — ABNORMAL HIGH (ref 65–99)

## 2015-08-13 LAB — HEMOGLOBIN A1C
Hgb A1c MFr Bld: 6.1 % — ABNORMAL HIGH (ref 4.8–5.6)
MEAN PLASMA GLUCOSE: 128 mg/dL

## 2015-08-13 MED ORDER — REGADENOSON 0.4 MG/5ML IV SOLN
INTRAVENOUS | Status: AC
  Administered 2015-08-13: 0.4 mg via INTRAVENOUS
  Filled 2015-08-13: qty 5

## 2015-08-13 MED ORDER — MEGESTROL ACETATE 40 MG PO TABS: 40.0000 mg | ORAL_TABLET | Freq: Two times a day (BID) | ORAL | Status: DC

## 2015-08-13 MED ORDER — TECHNETIUM TC 99M SESTAMIBI GENERIC - CARDIOLITE
10.0000 | Freq: Once | INTRAVENOUS | Status: AC | PRN
  Administered 2015-08-13: 10 via INTRAVENOUS

## 2015-08-13 MED ORDER — REGADENOSON 0.4 MG/5ML IV SOLN
0.4000 mg | Freq: Once | INTRAVENOUS | Status: AC
  Administered 2015-08-13: 0.4 mg via INTRAVENOUS
  Filled 2015-08-13: qty 5

## 2015-08-13 MED ORDER — DOCUSATE SODIUM 100 MG PO CAPS: 100.0000 mg | ORAL_CAPSULE | Freq: Two times a day (BID) | ORAL | Status: DC

## 2015-08-13 MED ORDER — FERROUS SULFATE 325 (65 FE) MG PO TABS: 325.0000 mg | ORAL_TABLET | Freq: Three times a day (TID) | ORAL | Status: DC

## 2015-08-13 MED ORDER — TECHNETIUM TC 99M SESTAMIBI GENERIC - CARDIOLITE
30.0000 | Freq: Once | INTRAVENOUS | Status: AC | PRN
  Administered 2015-08-13: 30 via INTRAVENOUS

## 2015-08-13 NOTE — Progress Notes (Signed)
Patient was discharged home accompanied by her son with no apparent distress noted. Discharge instructions explained to her son in [place]. He verbalized understanding. Copies of instructions given to the patient in spanish and english language, including the script. Discharged patient via wheelchair to her private vehicle with her son.

## 2015-08-13 NOTE — Progress Notes (Signed)
Patient asleep, family at bedside, call light within reach

## 2015-08-13 NOTE — Progress Notes (Signed)
Nutrition Brief Note  Patient identified on the Malnutrition Screening Tool (MST) Report.  Per weight readings below, pt has had a 4% weight loss since August 2016; not significant for time frame.  Wt Readings from Last 15 Encounters:  08/12/15 176 lb 14.4 oz (80.241 kg)  06/23/15 184 lb (83.462 kg)  05/09/15 185 lb (83.915 kg)  04/12/14 181 lb (82.101 kg)    Body mass index is 29.44 kg/(m^2). Patient meets criteria for Overweight based on current BMI.   Current diet order is Heart Healthy/Carbohydrate Modified, patient is consuming approximately 100% of meals at this time. Labs and medications reviewed.   No nutrition interventions warranted at this time. If nutrition issues arise, please consult RD.   Arthur Holms, RD, LDN Pager #: [phone] After-Hours Pager #: [phone]

## 2015-08-13 NOTE — Care Management Note (Signed)
Case Management Note Marvetta Gibbons RN, BSN Unit 2W-Case Manager [phone]  Patient Details  Name: Esabella Stockinger MRN: [id number] Date of Birth: 04/16/1966  Subjective/Objective:     Pt admitted with C/P and anemia               Action/Plan: PTA pt lived at home  - independent- per chart she has OBGYN  And Cardiology doctors that she follows- [facility] # placed on AVS for pt for assistance in finding PCP.  Expected Discharge Date:        08/14/15          Expected Discharge Plan:  Home/Self Care  In-House Referral:     Discharge planning Services  CM Consult  Post Acute Care Choice:    Choice offered to:     DME Arranged:    DME Agency:     HH Arranged:    HH Agency:     Status of Service:  Completed, signed off  Medicare Important Message Given:    Date Medicare IM Given:    Medicare IM give by:    Date Additional Medicare IM Given:    Additional Medicare Important Message give by:     If discussed at [place] of Stay Meetings, dates discussed:    Additional Comments:  Dawayne Patricia, RN 08/13/2015, 3:28 PM

## 2015-08-13 NOTE — Discharge Summary (Signed)
Physician Discharge Summary  Bret Stamour TIW:[id number] DOB: 1965-12-31 DOA: 08/11/2015  PCP: No PCP Per Patient  Admit date: 08/11/2015 Discharge date: 08/13/2015  Time spent: 65 minutes  Recommendations for Outpatient Follow-up:  1. Patient is to follow-up with Dr. Toney Rakes, GYN as scheduled. 2. Patient is to follow-up with her PCP one to 2 weeks. On follow-up patient will need a CBC done to follow-up on her hemoglobin as well as a basic metabolic profile done to follow-up on electrolytes and renal function.  Discharge Diagnoses:  Principal Problem:   Chest pain Active Problems:   HTN (hypertension)   Intramural leiomyoma of uterus   Anemia due to chronic blood loss   SOB (shortness of breath)   Anemia, iron deficiency   Discharge Condition: stable and improved  Diet recommendation: regular  Filed Weights   08/12/15 0024  Weight: 80.241 kg (176 lb 14.4 oz)    History of present illness:  Per Dr Marcelene Butte Dezeray Puccio is a 49 y.o. female with PMH of hypertension, leiomyoma of uterus, anemia due to blood loss, heavy menstrual period, who presented with chest pain.  Patient reported that she has been having chest pain and shortness of breath for nearly one month. She was seen by cardiologist, Dr. Sallyanne Kuster on 06/23/15. She had stress test on 08/08/15, but test was stopped due to poor exercise tolerance and elevated HR per Burtis Junes, ANP-C's note. Her chest pain was located in the frontal chest, radiating to the back, intermittent, 5-8/10 in severity. It was associated with shortness of breath. She also had tenderness over bilateral calf areas during the same period of time time. She reported that her chest pain was getting worse on the day of admission. She went to her PCPs office, who found that her hemoglobin dropped from 12.0 on 03/25/14 to 8. She was instructed to come to hospital for further evaluation and treatment. Patient denied cough, abdominal pain, diarrhea,  symptoms of UTI, unilateral weakness, fever or chills.  She reported that had been having heavy menstrual period in the past 3 months. She was seen by OB/Gyn, Dr. Toney Rakes on 06/27/15. Per Dr. Sandrea Hughs note, pt had endometrium biopsy which showed had no HYPERPLASIA, ATYPIA OR MALIGNANCY. She had normal normal pap smear 2015. She had US-pelvis showed intramural fibroid. She was started with Megace for 1 week and then to begin taking a 20 g oral contraception pill daily and have her withdrawal every 3 months.   In ED, patient was found to have WBC 9.4, Hgb 12.0-->7.7, temperature normal, tachycardia, electrolytes okay, negative chest x-ray. CTA of chest showed no evidence of acute PE or aortic dissection, but showed 4 mm left lower lobe pulmonary nodule.   Hospital Course:  #1 chest pain Patient was admitted with chest pain and cardiology consultation was obtained. Cardiac enzymes were cycled which were negative 3. CT angiogram of the chest which was done was negative for any evidence of PE or dissection have a noted a 4 mm left lower lobe pulmonary nodule. Chest x-ray was negative for any acute infiltrates. EKG was negative for any acute ischemic changes. Cardiology was consulted and patient was seen in consultation by Dr.Croitoru who felt that patient's cardiac symptoms may be secondary to her anemia. Patient underwent a Lexi scan Myoview study on 08/13/2015 which was a normal study with no ST segment deviation with a EF of 55-65% with no wall motion abnormalities. It was felt no further cardiac workup was needed and patient be discharged  in stable and improved condition.  #2  Iron deficiency anemia/anemia due to chronic blood loss secondary to intramural leiomyoma Patient was noted to be anemic on admission. Patient noted to have a history of fibroids with heavy menstrual periods over the past 3 months and felt patient's anemia was likely secondary to this. Anemia panel was obtained which was  consistent with iron deficiency anemia the patient was started on oral iron tablets. Dr. Wendee Beavers discussed patient's intramural leiomyoma and anemia with patient's gynecologist blood recommended placing patient on Megace 40 mg twice daily to take for the next 10 days and patient is to follow-up at her gynecologist office for IUD placement. Patient hemoglobin remained stable such that by day of discharge patient's hemoglobin was 8.2. Patient is to follow-up with a gynecologist as outpatient. Patient will also be discharged on oral iron supplementations.  #3 hypertension Patient's blood pressure remained stable. Patient was maintained on a home regimen of atenolol and amlodipine.  #4 shortness of breath Secondary to problem #2, resolved by day of discharge.  Procedures:  Myoview stress test 08/13/2015  2-D echo 08/12/2015  CT angio chest 08/11/2015  CXR 08/11/2015  Bilateral lower extremity Dopplers 08/12/2015  Consultations:  Cardiology: Dr Sallyanne Kuster 08/12/2015  Discharge Exam: Filed Vitals:   08/13/15 1028  BP: 127/55  Pulse: 73  Temp: 98.7 F (37.1 C)  Resp: 18    General: NAD Cardiovascular: RRR Respiratory: CTAB  Discharge Instructions   Discharge Instructions    Diet general    Complete by:  As directed      Discharge instructions    Complete by:  As directed   Follow up with GYN, this week.     Increase activity slowly    Complete by:  As directed           Current Discharge Medication List    START taking these medications   Details  docusate sodium (COLACE) 100 MG capsule Take 1 capsule (100 mg total) by mouth 2 (two) times daily. Qty: 10 capsule, Refills: 0    ferrous sulfate 325 (65 FE) MG tablet Take 1 tablet (325 mg total) by mouth 3 (three) times daily with meals. Refills: 3    megestrol (MEGACE) 40 MG tablet Take 1 tablet (40 mg total) by mouth 2 (two) times daily. tAKE FOR 10 DAYS, UNTIL SEEN BY GYN Qty: 30 tablet, Refills: 0      CONTINUE  these medications which have NOT CHANGED   Details  albuterol (PROVENTIL HFA;VENTOLIN HFA) 108 (90 BASE) MCG/ACT inhaler Inhale 2 puffs into the lungs every 2 (two) hours as needed for wheezing or shortness of breath (or coughing). Qty: 1 Inhaler, Refills: 0    amLODipine (NORVASC) 5 MG tablet Take 5 mg by mouth daily.    atenolol (TENORMIN) 25 MG tablet Take 1 tablet (25 mg total) by mouth daily. Qty: 30 tablet, Refills: 5       No Known Allergies Follow-up Information    Please follow up.   Why:  F/U WITH GYN THIS WEEK.      Please follow up.   Why:  F/U WITH PCP IN 1-2 WEEKS.       The results of significant diagnostics from this hospitalization (including imaging, microbiology, ancillary and laboratory) are listed below for reference.    Significant Diagnostic Studies: Dg Chest 2 View  08/11/2015   CLINICAL DATA:  Chest pain with cough for 3 months. Initial encounter.  EXAM: CHEST  2 VIEW  COMPARISON:  03/25/2014.  FINDINGS: The heart size and mediastinal contours are normal. The lungs are clear. There is no pleural effusion or pneumothorax. No acute osseous findings are identified.  IMPRESSION: Stable chest.  No active cardiopulmonary process.   Electronically Signed   By: Richardean Sale M.D.   On: 08/11/2015 18:36   Ct Angio Chest Pe W/cm &/or Wo Cm  08/11/2015   CLINICAL DATA:  Chest pain.  Short of breath.  Calf pain.  EXAM: CT ANGIOGRAPHY CHEST WITH CONTRAST  TECHNIQUE: Multidetector CT imaging of the chest was performed using the standard protocol during bolus administration of intravenous contrast. Multiplanar CT image reconstructions and MIPs were obtained to evaluate the vascular anatomy.  CONTRAST:  17mL OMNIPAQUE IOHEXOL 350 MG/ML SOLN  COMPARISON:  None.  FINDINGS: There are no filling defects in the pulmonary arterial tree to suggest acute pulmonary thromboembolism.  There is no evidence of aortic dissection or aneurysm.  No evidence of abnormal mediastinal adenopathy.   No pneumothorax.  No pleural effusion  Small visceral pleural node in the right minor fissure on image 40 of series 6. Similar smaller node in the right major fissure on the right on image 49. 4 mm left lower lobe pulmonary parenchymal nodule on image 53.  No vertebral compression deformity.  Review of the MIP images confirms the above findings.  IMPRESSION: No evidence of acute pulmonary thromboembolism  4 mm left lower lobe pulmonary nodule. If the patient is at high risk for bronchogenic carcinoma, follow-up chest CT at 1 year is recommended. If the patient is at low risk, no follow-up is needed. This recommendation follows the consensus statement: Guidelines for Management of Small Pulmonary Nodules Detected on CT Scans: A Statement from the [place] as published in Radiology 2005; 237:395-400.   Electronically Signed   By: Marybelle Killings M.D.   On: 08/11/2015 23:56    Microbiology: No results found for this or any previous visit (from the past 240 hour(s)).   Labs: Basic Metabolic Panel:  Recent Labs Lab 08/08/15 1632 08/11/15 1641 08/12/15 0107  NA 139 136 136  K 3.7 3.7 3.6  CL 105 104 103  CO2 21 23 24   GLUCOSE 92 123* 112*  BUN 8 8 6   CREATININE 0.66 0.87 0.78  CALCIUM 8.9 8.8* 8.6*   Liver Function Tests:  Recent Labs Lab 08/11/15 1641 08/12/15 0107  AST 24 23  ALT 23 22  ALKPHOS 69 69  BILITOT 0.4 0.4  PROT 7.0 6.5  ALBUMIN 3.6 3.4*   No results for input(s): LIPASE, AMYLASE in the last 168 hours. No results for input(s): AMMONIA in the last 168 hours. CBC:  Recent Labs Lab 08/08/15 1632  08/12/15 1220 08/12/15 1855 08/13/15 0003 08/13/15 0658 08/13/15 1142  WBC 8.8  < > 7.0 6.7 6.6 6.7 7.3  NEUTROABS 6.3  --   --   --   --   --   --   HGB 8.0*  < > 8.8* 8.9* 8.4* 8.6* 8.2*  HCT 25.0*  < > 28.4* 28.7* 27.3* 26.8* 26.5*  MCV 74.9*  < > 75.9* 75.3* 75.2* 75.7* 75.5*  PLT 279  < > 254 268 258 232 241  < > = values in this interval not  displayed. Cardiac Enzymes:  Recent Labs Lab 08/12/15 0043 08/12/15 0626 08/12/15 1043  TROPONINI <0.03 <0.03 <0.03   BNP: BNP (last 3 results) No results for input(s): BNP in the last 8760 hours.  ProBNP (last 3 results) No results  for input(s): PROBNP in the last 8760 hours.  CBG:  Recent Labs Lab 08/12/15 0644 08/13/15 0615  GLUCAP 100* 113*       Signed:  THOMPSON,DANIEL MD [facility] 08/13/2015, 2:51 PM

## 2015-08-13 NOTE — Progress Notes (Signed)
Preliminary review of nuclear images (by me) looks normal. Formal report to follow. No plan for invasive coronary evaluation. Low risk for major CV complications if she should need abdominal surgery. Sanda Klein, MD, [facility] [phone] office [phone] pager

## 2015-08-13 NOTE — Progress Notes (Signed)
Patient Profile: 49 year old [place] female with past medical history of hypertension, leiomyoma of uterus and anemia due to blood loss presented with significant anemia. Being evaluated for exertional CP and dyspnea.   Subjective: Currently w/o symptoms at rest. No complaints.   Objective: Vital signs in last 24 hours: Temp:  [98.1 F (36.7 C)-98.7 F (37.1 C)] 98.3 F (36.8 C) (10/05 0517) Pulse Rate:  [61-73] 62 (10/05 0854) Resp:  [18] 18 (10/05 0517) BP: (106-125)/(54-66) 123/59 mmHg (10/05 0854) SpO2:  [99 %-100 %] 99 % (10/05 0517) Last BM Date: 08/11/15  Intake/Output from previous day: 10/04 0701 - 10/05 0700 In: 360 [P.O.:360] Out: -  Intake/Output this shift:    Medications Current Facility-Administered Medications  Medication Dose Route Frequency Provider Last Rate Last Dose  . acetaminophen (TYLENOL) tablet 650 mg  650 mg Oral Q6H PRN Ivor Costa, MD       Or  . acetaminophen (TYLENOL) suppository 650 mg  650 mg Rectal Q6H PRN Ivor Costa, MD      . albuterol (PROVENTIL) (2.5 MG/3ML) 0.083% nebulizer solution 2.5 mg  2.5 mg Nebulization Q2H PRN Ivor Costa, MD      . amLODipine (NORVASC) tablet 5 mg  5 mg Oral Daily Ivor Costa, MD   5 mg at 08/12/15 0112  . aspirin EC tablet 81 mg  81 mg Oral Daily Ivor Costa, MD   81 mg at 08/12/15 1125  . atenolol (TENORMIN) tablet 25 mg  25 mg Oral Daily Ivor Costa, MD   25 mg at 08/12/15 0112  . dextrose 5 % and 0.45 % NaCl with KCl 20 mEq/L infusion   Intravenous Continuous Velvet Bathe, MD 100 mL/hr at 08/13/15 0647    . ferrous sulfate tablet 325 mg  325 mg Oral TID WC Ivor Costa, MD   325 mg at 08/12/15 1720  . hydrALAZINE (APRESOLINE) injection 5 mg  5 mg Intravenous Q2H PRN Ivor Costa, MD      . megestrol (MEGACE) tablet 40 mg  40 mg Oral BID Velvet Bathe, MD   40 mg at 08/12/15 2212  . ondansetron (ZOFRAN) tablet 4 mg  4 mg Oral Q6H PRN Ivor Costa, MD       Or  . ondansetron [facility]) injection 4 mg  4 mg Intravenous Q6H PRN  Ivor Costa, MD      . pantoprazole (PROTONIX) EC tablet 40 mg  40 mg Oral Q1200 Ivor Costa, MD   40 mg at 08/12/15 0131  . regadenoson (LEXISCAN) 0.4 MG/5ML injection SOLN           . regadenoson (LEXISCAN) injection SOLN 0.4 mg  0.4 mg Intravenous Once Brittainy M Simmons, PA-C      . sodium chloride 0.9 % injection 3 mL  3 mL Intravenous Q12H Ivor Costa, MD   3 mL at 08/12/15 0113    PE: General appearance: alert, cooperative and no distress Neck: no carotid bruit and no JVD Lungs: clear to auscultation bilaterally Heart: regular rate and rhythm, S1, S2 normal, no murmur, click, rub or gallop Extremities: no LEE Pulses: 2+ and symmetric Skin: warm and dry Neurologic: Grossly normal  Lab Results:   Recent Labs  08/12/15 1855 08/13/15 0003 08/13/15 0658  WBC 6.7 6.6 6.7  HGB 8.9* 8.4* 8.6*  HCT 28.7* 27.3* 26.8*  PLT 268 258 232   BMET  Recent Labs  08/11/15 1641 08/12/15 0107  NA 136 136  K 3.7 3.6  CL 104 103  CO2  23 24  GLUCOSE 123* 112*  BUN 8 6  CREATININE 0.87 0.78  CALCIUM 8.8* 8.6*   PT/INR  Recent Labs  08/12/15 0043  LABPROT 15.9*  INR 1.25   Cholesterol  Recent Labs  08/12/15 0107  CHOL 160   Cardiac Panel (last 3 results)  Recent Labs  08/12/15 0043 08/12/15 0626 08/12/15 1043  TROPONINI <0.03 <0.03 <0.03    Studies/Results: NST pending   Assessment/Plan    Principal Problem:   Chest pain Active Problems:   HTN (hypertension)   Intramural leiomyoma of uterus   Anemia due to chronic blood loss   SOB (shortness of breath)   1. Atypical chest pain, happen with exertion, likely related to significant anemia - CTA negative for PE or dissection - 08/08/2015 EF 55-60%, no regional wall motion abnormalities - Serial troponin negative. Lexiscan Myoview completed. No EKG changes during stress portion. Final report pending. If negative, she will need no further cardiac w/u.  2. Significant anemia  with heavy menstrual period - per primary team  3. Productive cough, negative CXR, WBC normal. Euvolemic on exam, no sign of HF  4. HTN: BP is controlled.   5. LLL pulm nodule noted on CTA of chest- per primary service   LOS: 2 days    Brittainy M. Rosita Fire, PA-C 08/13/2015 9:08 AM  I have seen and examined the patient along with Brittainy M. Rosita Fire, [place].  I have reviewed the chart, notes and new data.  I agree with PA's note.  Key new complaints: no complaints at rest Key examination changes: pale finger nailbeds/mucosae Key new findings / data: Hgb unchanged  PLAN: If nuclear study is normal, no further cardiac workup (this would also place her in a low surgical risk category if there is a plan for hysterectomy).  Sanda Klein, MD, [place] [phone] 08/13/2015, 10:25 AM

## 2015-08-18 ENCOUNTER — Ambulatory Visit (INDEPENDENT_AMBULATORY_CARE_PROVIDER_SITE_OTHER): Payer: 59 | Admitting: Gynecology

## 2015-08-18 ENCOUNTER — Encounter: Payer: Self-pay | Admitting: Gynecology

## 2015-08-18 VITALS — BP 140/88

## 2015-08-18 DIAGNOSIS — D251 Intramural leiomyoma of uterus: Secondary | ICD-10-CM

## 2015-08-18 DIAGNOSIS — N938 Other specified abnormal uterine and vaginal bleeding: Secondary | ICD-10-CM | POA: Diagnosis not present

## 2015-08-18 DIAGNOSIS — Z975 Presence of (intrauterine) contraceptive device: Secondary | ICD-10-CM | POA: Insufficient documentation

## 2015-08-18 DIAGNOSIS — N951 Menopausal and female climacteric states: Secondary | ICD-10-CM

## 2015-08-18 DIAGNOSIS — D500 Iron deficiency anemia secondary to blood loss (chronic): Secondary | ICD-10-CM | POA: Diagnosis not present

## 2015-08-18 DIAGNOSIS — Z30430 Encounter for insertion of intrauterine contraceptive device: Secondary | ICD-10-CM | POA: Diagnosis not present

## 2015-08-18 NOTE — Patient Instructions (Signed)
Colocación de un dispositivo intrauterino - Cuidados posteriores  (Intrauterine Device Insertion, Care After)  Siga estas instrucciones durante las próximas semanas. Estas indicaciones le proporcionan información general acerca de cómo deberá cuidarse después del procedimiento. El médico también podrá darle instrucciones más específicas. El tratamiento ha sido planificado según las prácticas médicas actuales, pero en algunos casos pueden ocurrir problemas. Comuníquese con el médico si tiene algún problema o tiene dudas después del procedimiento.  QUÉ ESPERAR DESPUÉS DEL PROCEDIMIENTO  La inserción del DIU puede causar molestias, como cólicos. que deberían mejorar una vez que el DIU esté en su lugar. Podrá tener sangrado después del procedimiento. Esto es normal. Varía desde un sangrado ligero durante un par de días hasta un sangrado similar al menstrual. Cuando el DIU esté en su lugar, se extenderá un hilo de 1 a 2 pulgadas (2,5 a 5 cm) por el cuello del útero en la vagina. El hilo no debería molestarle a usted ni a su pareja. De lo contrario, consulte con su médico.   INSTRUCCIONES PARA EL CUIDADO EN EL HOGAR   · Controle su DIU para asegurarse de que esté en su lugar, antes de reanudar la actividad sexual. Tiene que sentir los hilos. Si no los siente, algo puede estar mal. El DIU puede haberse salido del útero o éste puede haber sido atravesado (perforado) durante la colocación. Además, si los hilos son más largos, puede significar que el DIU se está saliendo del útero. Si ocurre alguno de estos problemas, no estará protegida y podrá quedar embarazada.  · Puede volver a tener relaciones sexuales si no tiene problemas con el DIU. El DIU de cobre se considera efectivo y funciona de inmediato, si se inserta dentro de los 7 días del inicio del período. Será necesario que utilice un método anticonceptivo adicional durante 7 días, si el DIU se inserta en algún otro momento del ciclo.  · Controle que el DIU sigue en su  lugar sintiendo los hilos después de cada período menstrual.  · Es posible que necesite tomar analgésicos, como acetaminofeno o ibuprofeno. Tome todos los medicamentos como le indicó el médico.  SOLICITE ATENCIÓN MÉDICA SI:   · Tiene un sangrado más abundante o dura más de un ciclo menstrual normal.  · Tiene fiebre.  · Siente cólicos o dolor abdominal que no se alivian con medicamentos.  · Siente dolor abdominal que no parece estar relacionado con el área en que sentía los cólicos y el dolor anteriormente.  · Se siente mareada, inusualmente débil o se desmaya.  · Tiene flujo vaginal u olores anormales.  · Siente dolor durante las relaciones sexuales.  · No puede sentir los hilos del DIU o los siente más largos.  · Siente que el DIU está en la abertura del cuello del útero, en la vagina.  · Piensa que está embarazada o no tiene su período menstrual.  · El hilo del DIU está lastimando a su pareja sexual.  ASEGÚRESE DE QUE:  · Comprende estas instrucciones.  · Controlará su afección.  · Recibirá ayuda de inmediato si no mejora o si empeora.     Esta información no tiene como fin reemplazar el consejo del médico. Asegúrese de hacerle al médico cualquier pregunta que tenga.     Document Released: 07/19/2012 Document Revised: 08/15/2013  Elsevier Interactive Patient Education ©2016 Elsevier Inc.

## 2015-08-18 NOTE — Progress Notes (Signed)
Patient is a 49 year old who was last seen the office on August 19 who recently was in the hospital as a result of her severe anemia and received a blood transfusion and had a workup because of her shortness of breath which consisted of a cardiology consultation as well as cardiac enzymes which were negative along with CT angiogram of the chest which was negative and no evidence of PE was noted with the exception of a 4 mm left lower lobe pulmonary nodule. Chest x-ray was negative for acute infiltrate. Her EKG was negative for any acute ischemic changes.Patient underwent a Lexi scan Myoview study on 08/13/2015 which was a normal study with no ST segment deviation with a EF of 55-65% with no wall motion abnormalities. It was felt no further cardiac workup was needed and patient be discharged. Patient is On Discharge Was 8.2 and She Was Discharged Home on Iron Supplementation and instructed to take Megace 40 mg twice a day for 10 days. She mistakenly continue to take her 20 g oral contraception pill. She reports no vaginal bleeding since she was discharged from the hospital.    When I saw the patient on August 19 she had an ultrasound which demonstrated a uterus with the measurement of 9.5 x 7.3 x 5.5 cm an endometrial stripe of 9.6 mm she's found to have 2 small fibroids the largest measuring 24 x 19 mm intramural left the room and normal in the right corpus luteum cyst measuring 20 x 17 mm was noted right adnexa normal no free fluid seen in the cul-de-sac. During that office issue she had an endometrial biopsy which demonstrated a polypoid endometrium with secretory activity no hyperplasia or malignancy identified. We had discussed several options Mirena IUD, 20 g oral, to separate the pill continuous to get her hemoglobin up and in the near future consider hysterectomy. She is here for placement of the Mirena IUD. Risks benefits and pros and cons were discussed.                          IUD procedure note       Patient presented to the office today for placement of Mirena IUD. The patient had previously been provided with literature information on this method of contraception. The risks benefits and pros and cons were discussed and all her questions were answered. She is fully aware that this form of contraception is 99% effective and is good for 5 years.  Pelvic exam: Bartholin urethra Skene glands: Within normal limits Vagina: No lesions or discharge Cervix: No lesions or discharge Uterus: Anteverted position Adnexa: No masses or tenderness Rectal exam: Not done  The cervix was cleansed with Betadine solution. A single-tooth tenaculum was placed on the anterior cervical lip. The uterus sounded to 9 centimeter. The IUD was shown to the patient and inserted in a sterile fashion. The IUD string was trimmed. The single-tooth tenaculum was removed. Patient was instructed to return back to the office in one month for follow up.   When patient returns back in one month we are going to check her CBC. She was instructed to take her iron tablet one twice a day. Patient to take 1 Megace tablet 40 mg daily for 1 week only. She was instructed to discontinue the 20 g oral contraception pill. We will reassess in 6 months if her hemoglobin has returned back to normal and she is asymptomatic she can still Mirena IUD but if it  causes much discomfort and anxiety at that point we may want to consider pursuing then with a laparoscopic-assisted vaginal hysterectomy. All this information was provided in [place]

## 2015-08-19 ENCOUNTER — Encounter: Payer: Self-pay | Admitting: Gynecology

## 2015-09-05 ENCOUNTER — Ambulatory Visit: Payer: 59 | Admitting: Cardiovascular Disease

## 2015-09-19 ENCOUNTER — Ambulatory Visit: Payer: 59 | Admitting: Gynecology

## 2015-09-22 ENCOUNTER — Other Ambulatory Visit: Payer: 59

## 2015-09-22 ENCOUNTER — Telehealth: Payer: Self-pay | Admitting: *Deleted

## 2015-09-22 DIAGNOSIS — D509 Iron deficiency anemia, unspecified: Secondary | ICD-10-CM

## 2015-09-22 LAB — CBC WITH DIFFERENTIAL/PLATELET
BASOS ABS: 0 10*3/uL (ref 0.0–0.1)
BASOS PCT: 0 % (ref 0–1)
Eosinophils Absolute: 0.1 10*3/uL (ref 0.0–0.7)
Eosinophils Relative: 1 % (ref 0–5)
HEMATOCRIT: 38 % (ref 36.0–46.0)
HEMOGLOBIN: 12.2 g/dL (ref 12.0–15.0)
LYMPHS PCT: 24 % (ref 12–46)
Lymphs Abs: 1.9 10*3/uL (ref 0.7–4.0)
MCH: 25.4 pg — ABNORMAL LOW (ref 26.0–34.0)
MCHC: 32.1 g/dL (ref 30.0–36.0)
MCV: 79 fL (ref 78.0–100.0)
Monocytes Absolute: 0.5 10*3/uL (ref 0.1–1.0)
Monocytes Relative: 7 % (ref 3–12)
NEUTROS ABS: 5.3 10*3/uL (ref 1.7–7.7)
NEUTROS PCT: 68 % (ref 43–77)
Platelets: 208 10*3/uL (ref 150–400)
RBC: 4.81 MIL/uL (ref 3.87–5.11)
RDW: 20.5 % — ABNORMAL HIGH (ref 11.5–15.5)
WBC: 7.8 10*3/uL (ref 4.0–10.5)

## 2015-09-22 NOTE — Telephone Encounter (Signed)
Pt walked in today stating she was bleeding and expelled her IUD over the weekend. Upon talking to her through [place], she is only bleeding now with wiping. She is having no pain today. She had the IUD placed due to irregular bleeding and had a previous Blood transfusion due to low Hgb before the IUD insertion 08/18/15.  The patient is scheduled for Fri 11/18 to see Dr Moshe Salisbury. She does not want to wait until then even though we encouraged her that she is stable. I went to discuss the case with Dr Moshe Salisbury. He advised bloodwork today CBC, PT, PTT and Fibrinogen and work in tomorrow before the 2pm patient. Pt advised and bloodwork drawn and apt made. All communicated through [place]. KW CMA

## 2015-09-23 ENCOUNTER — Other Ambulatory Visit: Payer: Self-pay | Admitting: Gynecology

## 2015-09-23 ENCOUNTER — Ambulatory Visit (INDEPENDENT_AMBULATORY_CARE_PROVIDER_SITE_OTHER): Payer: 59 | Admitting: Gynecology

## 2015-09-23 ENCOUNTER — Encounter: Payer: Self-pay | Admitting: Gynecology

## 2015-09-23 ENCOUNTER — Ambulatory Visit (INDEPENDENT_AMBULATORY_CARE_PROVIDER_SITE_OTHER): Payer: 59

## 2015-09-23 VITALS — BP 138/86

## 2015-09-23 DIAGNOSIS — N921 Excessive and frequent menstruation with irregular cycle: Secondary | ICD-10-CM

## 2015-09-23 DIAGNOSIS — Z30430 Encounter for insertion of intrauterine contraceptive device: Secondary | ICD-10-CM

## 2015-09-23 DIAGNOSIS — Z862 Personal history of diseases of the blood and blood-forming organs and certain disorders involving the immune mechanism: Secondary | ICD-10-CM

## 2015-09-23 DIAGNOSIS — N939 Abnormal uterine and vaginal bleeding, unspecified: Secondary | ICD-10-CM | POA: Diagnosis not present

## 2015-09-23 DIAGNOSIS — D251 Intramural leiomyoma of uterus: Secondary | ICD-10-CM | POA: Diagnosis not present

## 2015-09-23 LAB — APTT: aPTT: 32 seconds (ref 24–37)

## 2015-09-23 LAB — PROTIME-INR
INR: 1.05 (ref <1.50)
Prothrombin Time: 13.8 seconds (ref 11.6–15.2)

## 2015-09-23 LAB — FIBRINOGEN: FIBRINOGEN: 496 mg/dL — AB (ref 204–475)

## 2015-09-23 MED ORDER — DOXYCYCLINE HYCLATE 100 MG PO CAPS: 100.0000 mg | ORAL_CAPSULE | Freq: Two times a day (BID) | ORAL | Status: DC

## 2015-09-23 NOTE — Patient Instructions (Signed)
Histerectoma vaginal asistida por laparoscopa  (Laparoscopically Assisted Vaginal Hysterectomy) La histerectoma vaginal asistida por laparoscopa (HVAL) es un procedimiento quirrgico para extirpar el tero y el cuello uterino, y en algunos casos los ovarios y las trompas de [place]. Durante la HVAL, cierta parte de la remocin [place] se realiza a travs de la vagina, y el resto a travs de pequeos cortes quirrgicos (incisiones) en el abdomen.  Este procedimiento generalmente se indica en mujeres en las que la histerectoma vaginal no es una opcin. El mdico [employer] riesgos y beneficios de los diferentes tcnicas quirrgicas cuando concurra a la visita. Generalmente el tiempo de recuperacin es rpido y hay menos complicaciones luego de los procedimientos laparoscpicos que en los procedimientos de [place]. INFORME A SU MDICO:   Cualquier alergia que tenga.  Todos los [employer] [place], incluyendo vitaminas, hierbas, gotas oftlmicas, cremas y medicamentos de venta libre.  Problemas previos que usted o los [employer] de su familia hayan tenido con el uso de anestsicos.  Enfermedades de [employer].  Cirugas previas.  Padecimientos mdicos. RIESGOS Y COMPLICACIONES Generalmente es un procedimiento seguro. Sin embargo, Games developer procedimiento, pueden surgir complicaciones. Las complicaciones posibles son:  Risk analyst a los medicamentos.  Dificultad para respirar.  Hemorragias.  Infeccin.  Dao a otras estructuras cercanas al tero y al cuello. ANTES DEL PROCEDIMIENTO  Consulte a su mdico si debe cambiar o suspender los medicamentos que toma habitualmente.  [employer], como los que necesita para prepararse vaciando el colon, segn las indicaciones.  No coma ni beba nada durante al menos 8 horas antes del procedimiento.  Si fuma, abandone el hbito. Si deja de fumar mejorar su salud despus de la [place].  Pdale a alguien  que la lleve a su casa despus de la ciruga y la ayude en casa mientras se recupera. PROCEDIMIENTO   Le colocarn una va intravenosa (IV) en una vena para administrarle lquidos y medicamentos.  Recibir medicamentos para relajarse y otros que la harn dormir (anestesia general).  Le colocarn un tubo flexible (catter) en la vejiga para drenar la orina.  Tambin insertarn un tubo a travs de la nariz o la boca hacia el estmago (sonda nasogstrica). La sonda nasogstrica drena los jugos digestivos e impide que sienta nuseas o tenga vmitos.  Le colocarn medias ajustadas (compresin) en las piernas para Product manager.  Le practicarn tres o cuatro incisiones pequeas en el abdomen. Tambin le harn una incisin en la vagina. Le insertarn unas sondas e instrumentos a travs de las pequeas incisiones. Extirparn el tero y el cuello uterino (y posiblemente los ovarios y las trompas de [place]) a travs de la vagina, as como a travs de las pequeas incisiones que se hicieron en el abdomen.  Luego la vagina se sutura a su estado normal. DESPUS DEL PROCEDIMIENTO  Posiblemente deba seguir una dieta lquida por un tiempo. Lo ms probable es volver a la dieta habitual y Engineer, structural bien al da siguiente de la [place].  Tendr que orinar a travs de un catter. Este se Teacher, English as a foreign language despus de la [place].  Le harn controles regulares de la temperatura, frecuencia cardaca y respiratoria, presin arterial y nivel de oxgeno.  Seguir [employer] de compresin en las piernas hasta que pueda moverse.  Usar un dispositivo especial o har ejercicios de respiracin para mantener los pulmones limpios.  La alentarn a caminar lo ms pronto posible.   Esta informacin no tiene Marine scientist el consejo del mdico.  Asegrese de hacerle al mdico cualquier pregunta que tenga.   Document Released: 10/14/2011 Document Revised: 11/15/2014 Elsevier Interactive Patient Education  [employer].

## 2015-09-23 NOTE — Progress Notes (Signed)
Patient is a 49 year old gravida 5 para 2 AB 3 (1 ectopic pregnancy/left LSO, 1 spontaneous AB, one elective AB) who has been dealing with menorrhagia and anemia as a result of fibroid uterus for quite some time now. In August of this year patient had an ultrasound which demonstrated the following:  a uterus with the measurement of 9.5 x 7.3 x 5.5 cm an endometrial stripe of 9.6 mm she's found to have 2 small fibroids the largest measuring 24 x 19 mm intramural left the room and normal in the right corpus luteum cyst measuring 20 x 17 mm was noted right adnexa normal no free fluid seen in the cul-de-sac. During that office issue she had an endometrial biopsy which demonstrated a polypoid endometrium with secretory activity no hyperplasia or malignancy identified.   As a result of her menorrhagia and severe anemia she has been transfused in the past. She was currently on Megace 40 mg twice a day. On October 10 of this year and a Mirena IUD was placed. She stated 3 days ago she exposed the IUD. After passage of large clots. Review of her record indicated that her hemoglobin on October 5 was 8.2 and yesterday was 12.2 with a platelet count 208,000. PT PTT and fibrinogen INR were all normal.  Endometrial biopsy July 10 this year demonstrated the following: Diagnosis Endometrium, biopsy - POLYPOID ENDOMETRIUM WITH SECRETORY ACTIVITY. - NO HYPERPLASIA, ATYPIA OR MALIGNANCY IDENTIFIED.  We  Had placed a Mirena IUD in an effort to control her bleeding prior to her surgery as well as for contraception.  Since she has exposed it we are going to put a new one under ultrasound guidance today  and schedule her surgery within the next month. In the meantime she'll continue to take her iron tablet one twice a day. She'll take the Megace 40 mg twice a day for 3 more days then stop.    The ultrasound room: the patient's cervix was cleansed with Betadine  Solution an asymmetry tenaculum was placed on the anterior  cervical lip. On ultrasound it appeared to be a large clot sitting inside the uterine cavity. A  Vabra  Aspiration was undertaken to remove the clots from the uterine cavity to be able to place a Mirena IUD under ultrasound guidance. The tissue was submitted for histological evaluation. Upon repeating this ultrasound scan the cavity was cleared of the large clot. And the Mirena IUD was placed in the following fashion:  Ultrasound report: Uterus measured 10.3 x 7.4 x 5.6 cm within mutual stripe 26.7 mm. Prominent endometrium with fluid fill in the cavity was reported defect avascular 58 x 20 mm. Post VABRA endometrium within normal limits. 3.6 nontender. Right ovary not seen. Left ovary normal. Negative right adnexa. The IUD was placed under ultrasound guidance:                                                                    IUD procedure note        The patient had previously been provided with literature information on this method of contraception. The risks benefits and pros and cons were discussed and all her questions were answered. She is fully aware that this form of contraception is 99% effective and is  good for  5 years.  Pelvic exam: Bartholin urethra Skene glands: Within normal limits Vagina: No lesions or discharge Cervix: No lesions or discharge Uterus:  anteverted position Adnexa: No masses or tenderness Rectal exam: Not done  The cervix was cleansed with Betadine solution. A single-tooth tenaculum was placed on the anterior cervical lip. The uterus sounded to  8/2 centimeter. The IUD was shown to the patient and inserted in a sterile fashion. The IUD string was trimmed. The single-tooth tenaculum was removed.   Patient will be scheduled for her hysterectomy on December 20. Meanwhile she will continue with her iron tablet one twice a day. For prophylaxis as a result of the suctioning of her uterine cavity to remove the blood clots she is going to be placed on Vibramycin 100 mg  twice a day for 7 days. She will continue the Megace 40 mg twice a day till the end of the week. She will be seen in the office a week of surgery for preop consultation , examination, and repeating her CBC. Literature information was provided her laparoscopic-assisted vaginal hysterectomy with bilateral salpingo-oophorectomy in Spanish.

## 2015-09-24 ENCOUNTER — Encounter: Payer: Self-pay | Admitting: Gynecology

## 2015-09-26 ENCOUNTER — Ambulatory Visit: Payer: 59 | Admitting: Gynecology

## 2015-09-29 ENCOUNTER — Other Ambulatory Visit: Payer: Self-pay

## 2015-10-09 ENCOUNTER — Encounter: Payer: Self-pay | Admitting: Gynecology

## 2015-10-09 ENCOUNTER — Ambulatory Visit (INDEPENDENT_AMBULATORY_CARE_PROVIDER_SITE_OTHER): Payer: 59 | Admitting: Gynecology

## 2015-10-09 ENCOUNTER — Telehealth: Payer: Self-pay | Admitting: *Deleted

## 2015-10-09 VITALS — BP 140/84

## 2015-10-09 DIAGNOSIS — Z23 Encounter for immunization: Secondary | ICD-10-CM

## 2015-10-09 DIAGNOSIS — N920 Excessive and frequent menstruation with regular cycle: Secondary | ICD-10-CM | POA: Diagnosis not present

## 2015-10-09 DIAGNOSIS — D251 Intramural leiomyoma of uterus: Secondary | ICD-10-CM

## 2015-10-09 DIAGNOSIS — R102 Pelvic and perineal pain: Secondary | ICD-10-CM

## 2015-10-09 DIAGNOSIS — Z01818 Encounter for other preprocedural examination: Secondary | ICD-10-CM

## 2015-10-09 LAB — CBC WITH DIFFERENTIAL/PLATELET
BASOS PCT: 1 % (ref 0–1)
Basophils Absolute: 0.1 10*3/uL (ref 0.0–0.1)
EOS ABS: 0.1 10*3/uL (ref 0.0–0.7)
EOS PCT: 1 % (ref 0–5)
HCT: 36.5 % (ref 36.0–46.0)
Hemoglobin: 11.8 g/dL — ABNORMAL LOW (ref 12.0–15.0)
LYMPHS ABS: 2.1 10*3/uL (ref 0.7–4.0)
Lymphocytes Relative: 25 % (ref 12–46)
MCH: 25.5 pg — AB (ref 26.0–34.0)
MCHC: 32.3 g/dL (ref 30.0–36.0)
MCV: 78.8 fL (ref 78.0–100.0)
MONOS PCT: 7 % (ref 3–12)
Monocytes Absolute: 0.6 10*3/uL (ref 0.1–1.0)
NEUTROS PCT: 66 % (ref 43–77)
Neutro Abs: 5.5 10*3/uL (ref 1.7–7.7)
PLATELETS: 191 10*3/uL (ref 150–400)
RBC: 4.63 MIL/uL (ref 3.87–5.11)
RDW: 20 % — AB (ref 11.5–15.5)
WBC: 8.3 10*3/uL (ref 4.0–10.5)

## 2015-10-09 MED ORDER — MEGESTROL ACETATE 40 MG PO TABS: ORAL_TABLET | ORAL | Status: DC

## 2015-10-09 MED ORDER — PHENAZOPYRIDINE HCL 200 MG PO TABS: ORAL_TABLET | ORAL | Status: DC

## 2015-10-09 MED ORDER — OXYCODONE-ACETAMINOPHEN 5-325 MG PO TABS: 1.0000 | ORAL_TABLET | ORAL | Status: DC | PRN

## 2015-10-09 NOTE — Telephone Encounter (Signed)
Pt called Stephanie Gibson and c/o Heavy bleeding no pain. Has surgery on Dec 20 she wants to move up her consult appointment but can not come today. JF said pt can come in today at 12:00pm Stephanie Gibson called pt and told her she needs to come today, pt didn't schedule OV yet as she will need to get a ride to office per Stephanie Gibson. Pt will call back

## 2015-10-09 NOTE — Progress Notes (Signed)
Stephanie Gibson is an 49 y.o. female presented to the office today stating that some yesterday and today she began experiencing heavy bleeding and cramping. Patient had a Mirena IUD twice in the past and her history is as follows:  She is a gravida 5 para 2 AB 3 (1 ectopic pregnancy/left LSO, 1 spontaneous AB, one elective AB) who has been dealing with menorrhagia and anemia as a result of fibroid uterus for quite some time now. In August of this year patient had an ultrasound which demonstrated the following:  a uterus with the measurement of 9.5 x 7.3 x 5.5 cm an endometrial stripe of 9.6 mm she's found to have 2 small fibroids the largest measuring 24 x 19 mm intramural left the room and normal in the right corpus luteum cyst measuring 20 x 17 mm was noted right adnexa normal no free fluid seen in the cul-de-sac. During that office issue she had an endometrial biopsy which demonstrated a polypoid endometrium with secretory activity no hyperplasia or malignancy identified.   As a result of her menorrhagia and severe anemia she has been transfused in the past. She was currently on Megace 40 mg twice a day. On October 10 of this year and a Mirena IUD was placed. She stated 3 days ago she exposed the IUD. After passage of large clots. Review of her record indicated that her hemoglobin on October 5 was 8.2 and yesterday was 12.2 with a platelet count 208,000. PT PTT and fibrinogen INR were all normal.  Endometrial biopsy July 10 this year demonstrated the following: Diagnosis Endometrium, biopsy - POLYPOID ENDOMETRIUM WITH SECRETORY ACTIVITY. - NO HYPERPLASIA, ATYPIA OR MALIGNANCY IDENTIFIED.  We Had placed a Mirena IUD in an effort to control her bleeding prior to her surgery as well as for contraception. Since she has  espulsed it we put a new one under ultrasound guidance 09/23/2015.  On exam today the bottom portion of the IUD was partially extruded through the external cervical canal and  was removed. There was blood in the vaginal vault but was CAD was removed shown to the patient discarded there was no evidence of any persistent bleeding. She is currently on iron tablet one twice a day and in the past she had been placed on Megace prior to the IUD placement. Her last CBC 2 weeks ago indicated her hemoglobin was 12.2 and back in October 5 she was down to 8.2 g. Her surgery scheduled for December 20 where she is scheduled for a laparoscopic-assisted vaginal hysterectomy and bilateral salpingo-oophorectomy along with cystoscopy possible laparotomy.  Patient is informing her past gynecological surgeries have been as follows: One cesarean section in [place] for which she received blood transfusion Laparotomy for a ruptured ectopic pregnancy also in [place] but she does not recall which tube and ovary was removed she also received a transfusion then as well as. She then had a tubal ligation as well in [place] of her remaining fallopian tube.  Pertinent Gynecological History: Menses: Metromenorrhagia Bleeding: Minimal menorrhagia Contraception: tubal ligation DES exposure: denies Blood transfusions: See above Sexually transmitted diseases: no past history Previous GYN Procedures: See above  Last mammogram: normal Date: 2016 Last pap: normal Date: 2015 OB History: G 4, P 2 AB 2 (1 miscarriage 1 ectopic pregnancy)   Menstrual History: Menarche age: 49 Patient's last menstrual period was 08/10/2015 (approximate).    Past Medical History  Diagnosis Date  . Ectopic pregnancy   . Essential hypertension   . Leiomyoma of  uterus   . Anemia     Past Surgical History  Procedure Laterality Date  . Cesarean section    . Oophorectomy    . Tubal ligation      Family History  Problem Relation Age of Onset  . Hypertension Mother   . Cancer Father     THROAT     Social History:  reports that she has never smoked. She does not have any smokeless tobacco history on file. She reports that  she does not drink alcohol or use illicit drugs.  Allergies: No Known Allergies   (Not in a hospital admission)  REVIEW OF SYSTEMS: A ROS was performed and pertinent positives and negatives are included in the history.  GENERAL: No fevers or chills. HEENT: No change in vision, no earache, sore throat or sinus congestion. NECK: No pain or stiffness. CARDIOVASCULAR: No chest pain or pressure. No palpitations. PULMONARY: No shortness of breath, cough or wheeze. GASTROINTESTINAL: No abdominal pain, nausea, vomiting or diarrhea, melena or bright red blood per rectum. GENITOURINARY: No urinary frequency, urgency, hesitancy or dysuria. MUSCULOSKELETAL: No joint or muscle pain, no back pain, no recent trauma. DERMATOLOGIC: No rash, no itching, no lesions. ENDOCRINE: No polyuria, polydipsia, no heat or cold intolerance. No recent change in weight. HEMATOLOGICAL: No anemia or easy bruising or bleeding. NEUROLOGIC: No headache, seizures, numbness, tingling or weakness. PSYCHIATRIC: No depression, no loss of interest in normal activity or change in sleep pattern.     Blood pressure 140/84, last menstrual period 08/10/2015.  Physical Exam:  HEENT:unremarkable Neck:Supple, midline, no thyroid megaly, no carotid bruits Lungs:  Clear to auscultation no rhonchi's or wheezes Heart:Regular rate and rhythm, no murmurs or gallops Breast Exam: Not examined Abdomen: Soft nontender no rebound or guarding Pfannenstiel scar noted several laparoscopic port scar sites noted Pelvic:BUS within normal limits Vagina: Some blood was noted in the vaginal vault Cervix: As described above portion of the IUD was found to be at the external cervical loss was grasped retrieved and shown to the patient discarded Uterus: Anteverted 8 week size slightly irregular Adnexa: No palpable masses or tenderness Extremities: No cords, no edema Rectal: Not done   Assessment/Plan: 49 year old with metromenorrhagia early this year had to  be transfused because of her low hemoglobin. Several attempts to place her Mirena IUD to control bleeding prior to schedule surgery unsuccessful. Patient scheduled for laparoscopic-assisted vaginal hysterectomy with bilateral salpingo-oophorectomy December 20 of this year possible laparotomy. We are going to try to move her case up before then. We are going to check her CBC today. She was prescribed Megace 40 mg twice a day to take until the time of surgery. She was also prescribed prescription for Percocet 5/325 to take 1 by mouth every 4-6 hours when necessary postop pain and also prescribed the Pyridium 200 mg take 1 by mouth the night before surgery and the morning of surgery in an effort to assess ureteral patency after her surgery during cystoscopic evaluation. All the above was discussed in Spanish to include the following risk:                        Patient was counseled as to the risk of surgery to include the following:  1. Infection (prohylactic antibiotics will be administered)  2. DVT/Pulmonary Embolism (prophylactic pneumo compression stockings will be used)  3.Trauma to internal organs requiring additional surgical procedure to repair any injury to     Internal organs requiring perhaps additional  hospitalization days.  4.Hemmorhage requiring transfusion and blood products which carry risks such as             anaphylactic reaction, hepatitis and AIDS  Patient had received literature information on the procedure scheduled and all her questions were answered and fully accepts all risk.  She was reminded to continue her iron supplementation 1 tablet twice a day.   FERNANDEZ,JUAN HMD3:05 PMTD@Note :

## 2015-10-09 NOTE — Addendum Note (Signed)
Addended by: Alen Blew on: 10/09/2015 03:20 PM   Modules accepted: Orders

## 2015-10-10 NOTE — Progress Notes (Signed)
Claudia informed pt with the below. 

## 2015-10-15 NOTE — Patient Instructions (Addendum)
Your procedure is scheduled on:  Tuesday, Dec. 13, 2016  Enter through the [employer] [facility] at:  9:00 A.M.  Pick up the phone at the desk and dial [phone].  Call this number if you have problems the morning of surgery: [phone].  Remember: Do NOT eat food or drink after:  Midnight Monday Take these medicines the morning of surgery with a SIP OF WATER:  Amlodopine, Atenolol. Pyridium  *Bring in asthma inhaler day of surgery  Do NOT wear jewelry (body piercing), metal hair clips/bobby pins, make-up, or nail polish. Do NOT wear lotions, powders, or perfumes.  You may wear deoderant. Do NOT shave for 48 hours prior to surgery. Do NOT bring valuables to the hospital. Contacts, dentures, or bridgework may not be worn into surgery. Leave suitcase in car.  After surgery it may be brought to your room.  For patients admitted to the hospital, checkout time is 11:00 AM the day of discharge.  Instrucciones:  Su cirugia esta programada para-( your procedure is scheduled on) Tuesday, Dec. 13, 2016  Entre por la entrada principal a la(s) -(enter through the main entrance at): 9:00 A.M.  [address] telefono,  [place] [address] e informenos de su llegada ( pick up phone, dial [phone] on arrival)  Por favor llame al [phone] si tiene algun problema la [place] ( please call [phone] if you have any problems the morning of surgery.)  Recuerde: (Remember)  No coma alimentos ni tome liquidos, incluyendo agua, despues de la medianoche del  ( Do not eat food or drink liquids including water after midnight on Midnight Monday  [place] con un sorbito de agua (take these meds the morning of surgery with a SIP of water)  Amlodopine, Atenolol. Pyridium  *Bring in asthma inhaler day of surgery   Puede cepillarse los dientes en la manana de la [place]. (you may brush your teeth the morning of surgery)  NO use joyas, maquillaje de ojos,  lapiz labial, crema para el cuerpo o esmalte de unas oscuro - las unas de los pies pueden estar pintados. ( Do not wear jewelry, eye makeup, lipstick, body lotion, or dark fingernail polish)  Puede usar desodorante ( you may wear deodorant)  Si va a ser ingresado despues de las [place], deje la [place] en el carro hasta que se le haya asignado una habitacion. ( If you are to be admitted after surgery, leave suitcase in car until your room has been assigned.)  A los pacientes que se les de de alta el mismo dia no se les permitira manejar a casa.  ( Patients discharged on the day of surgery will not be allowed to drive home)  Use ropa suelta y comoda de regreso a Manufacturing engineer. ( wear loose comfortable clothes for ride home)  [place] (patient signature) ______________________________________

## 2015-10-16 ENCOUNTER — Encounter (HOSPITAL_COMMUNITY): Payer: Self-pay

## 2015-10-16 ENCOUNTER — Encounter (HOSPITAL_COMMUNITY)
Admission: RE | Admit: 2015-10-16 | Discharge: 2015-10-16 | Disposition: A | Discharge status: Home / Self Care | Payer: 59 | Source: Ambulatory Visit | Attending: Gynecology | Admitting: Gynecology

## 2015-10-16 ENCOUNTER — Other Ambulatory Visit (HOSPITAL_COMMUNITY): Payer: 59

## 2015-10-16 DIAGNOSIS — N946 Dysmenorrhea, unspecified: Secondary | ICD-10-CM | POA: Diagnosis not present

## 2015-10-16 DIAGNOSIS — N920 Excessive and frequent menstruation with regular cycle: Secondary | ICD-10-CM | POA: Insufficient documentation

## 2015-10-16 DIAGNOSIS — D259 Leiomyoma of uterus, unspecified: Secondary | ICD-10-CM | POA: Diagnosis not present

## 2015-10-16 DIAGNOSIS — Z01818 Encounter for other preprocedural examination: Secondary | ICD-10-CM | POA: Insufficient documentation

## 2015-10-16 HISTORY — DX: Pneumonia, unspecified organism: J18.9

## 2015-10-16 HISTORY — DX: Reserved for inherently not codable concepts without codable children: IMO0001

## 2015-10-16 LAB — CBC
HCT: 49.1 % — ABNORMAL HIGH (ref 36.0–46.0)
Hemoglobin: 16.8 g/dL — ABNORMAL HIGH (ref 12.0–15.0)
MCH: 26.7 pg (ref 26.0–34.0)
MCHC: 34.2 g/dL (ref 30.0–36.0)
MCV: 78.1 fL (ref 78.0–100.0)
Platelets: 125 10*3/uL — ABNORMAL LOW (ref 150–400)
RBC: 6.29 MIL/uL — ABNORMAL HIGH (ref 3.87–5.11)
RDW: 19.2 % — AB (ref 11.5–15.5)
WBC: 6.3 10*3/uL (ref 4.0–10.5)

## 2015-10-16 LAB — BASIC METABOLIC PANEL
Anion gap: 7 (ref 5–15)
BUN: 11 mg/dL (ref 6–20)
CALCIUM: 9.3 mg/dL (ref 8.9–10.3)
CO2: 25 mmol/L (ref 22–32)
CREATININE: 0.66 mg/dL (ref 0.44–1.00)
Chloride: 108 mmol/L (ref 101–111)
GFR calc Af Amer: >60 mL/min (ref >60)
GLUCOSE: 108 mg/dL — AB (ref 65–99)
Potassium: 3.5 mmol/L (ref 3.5–5.1)
Sodium: 140 mmol/L (ref 135–145)

## 2015-10-16 NOTE — Pre-Procedure Instructions (Signed)
Dr. Veatrice Kells made aware of CBC results referred me to discuss with Dr. Toney Rakes.  I left a message with Juliann Pulse at Dr. Sandrea Hughs office detailing the results of patient's CBC from today's pre op visit.

## 2015-10-17 ENCOUNTER — Other Ambulatory Visit: Payer: Self-pay | Admitting: Gynecology

## 2015-10-17 ENCOUNTER — Telehealth: Payer: Self-pay

## 2015-10-17 ENCOUNTER — Other Ambulatory Visit: Payer: 59

## 2015-10-17 DIAGNOSIS — D582 Other hemoglobinopathies: Secondary | ICD-10-CM

## 2015-10-17 DIAGNOSIS — R718 Other abnormality of red blood cells: Secondary | ICD-10-CM

## 2015-10-17 LAB — CBC WITH DIFFERENTIAL/PLATELET
BASOS PCT: 1 % (ref 0–1)
Basophils Absolute: 0.1 10*3/uL (ref 0.0–0.1)
EOS PCT: 1 % (ref 0–5)
Eosinophils Absolute: 0.1 10*3/uL (ref 0.0–0.7)
HEMATOCRIT: 35.1 % — AB (ref 36.0–46.0)
HEMOGLOBIN: 11.5 g/dL — AB (ref 12.0–15.0)
LYMPHS ABS: 1.9 10*3/uL (ref 0.7–4.0)
Lymphocytes Relative: 27 % (ref 12–46)
MCH: 25.9 pg — AB (ref 26.0–34.0)
MCHC: 32.8 g/dL (ref 30.0–36.0)
MCV: 79.1 fL (ref 78.0–100.0)
MONO ABS: 0.6 10*3/uL (ref 0.1–1.0)
MONOS PCT: 9 % (ref 3–12)
Neutro Abs: 4.4 10*3/uL (ref 1.7–7.7)
Neutrophils Relative %: 62 % (ref 43–77)
Platelets: 183 10*3/uL (ref 150–400)
RBC: 4.44 MIL/uL (ref 3.87–5.11)
RDW: 20.2 % — ABNORMAL HIGH (ref 11.5–15.5)
WBC: 7.1 10*3/uL (ref 4.0–10.5)

## 2015-10-17 NOTE — Telephone Encounter (Signed)
Tomeka at [facility] called to make you aware of patient's pre op lab/CBC.  You checked it in office Dec 1 and they checked it yesterday./ Her hgb went from being low to being elevated in that 7 day period.  Also platelets increased.  Labs are in chart. Anything to recommend?

## 2015-10-17 NOTE — Telephone Encounter (Signed)
I contacted patient for her to come in this afternoon. She needs a CBC to be repeated. This variation appears to be more like hemolysis and nothing else since she is only taking 2 iron tablets daily and is only been a week since her last CBC. Please tell Champ Mungo to send the CBC stat.

## 2015-10-17 NOTE — Telephone Encounter (Signed)
Order placed for stat CBC.

## 2015-10-20 ENCOUNTER — Telehealth: Payer: Self-pay

## 2015-10-20 MED ORDER — DEXTROSE 5 % IV SOLN
2.0000 g | INTRAVENOUS | Status: AC
  Administered 2015-10-21: 2 g via INTRAVENOUS
  Filled 2015-10-20: qty 2

## 2015-10-20 NOTE — Telephone Encounter (Signed)
Rosemarie Ax called patient because we received a letter from [facility] that stated she was "not current with his/her insurance premium payments. The member is in the second month of her grace period".  Rosemarie Ax called her with this info and patient stated to Rosemarie Ax that her insurance premiums have been paid. Rosemarie Ax advised her that if her insurance does not pay [facility] will hold her responsible for the estimated charge of $2150.00 for her surgery.  Rosemarie Ax suggested to patient that she contact [employer] to see what the issue is.

## 2015-10-21 ENCOUNTER — Ambulatory Visit (HOSPITAL_COMMUNITY): Payer: 59 | Admitting: Anesthesiology

## 2015-10-21 ENCOUNTER — Encounter (HOSPITAL_COMMUNITY): Payer: Self-pay

## 2015-10-21 ENCOUNTER — Observation Stay (HOSPITAL_COMMUNITY)
Admission: RE | Admit: 2015-10-21 | Discharge: 2015-10-22 | Disposition: A | Discharge status: Home / Self Care | Payer: 59 | Source: Ambulatory Visit | Attending: Gynecology | Admitting: Gynecology

## 2015-10-21 ENCOUNTER — Encounter (HOSPITAL_COMMUNITY)
Admission: RE | Disposition: A | Discharge status: Home / Self Care | Payer: Self-pay | Source: Ambulatory Visit | Attending: Gynecology

## 2015-10-21 ENCOUNTER — Institutional Professional Consult (permissible substitution): Payer: 59 | Admitting: Gynecology

## 2015-10-21 DIAGNOSIS — N946 Dysmenorrhea, unspecified: Secondary | ICD-10-CM | POA: Insufficient documentation

## 2015-10-21 DIAGNOSIS — D649 Anemia, unspecified: Secondary | ICD-10-CM | POA: Insufficient documentation

## 2015-10-21 DIAGNOSIS — D259 Leiomyoma of uterus, unspecified: Secondary | ICD-10-CM

## 2015-10-21 DIAGNOSIS — I10 Essential (primary) hypertension: Secondary | ICD-10-CM | POA: Diagnosis not present

## 2015-10-21 DIAGNOSIS — N920 Excessive and frequent menstruation with regular cycle: Secondary | ICD-10-CM | POA: Diagnosis not present

## 2015-10-21 DIAGNOSIS — D500 Iron deficiency anemia secondary to blood loss (chronic): Secondary | ICD-10-CM | POA: Diagnosis not present

## 2015-10-21 DIAGNOSIS — Z9889 Other specified postprocedural states: Secondary | ICD-10-CM

## 2015-10-21 HISTORY — PX: SALPINGOOPHORECTOMY: SHX82

## 2015-10-21 HISTORY — PX: CYSTOSCOPY: SHX5120

## 2015-10-21 HISTORY — PX: LAPAROSCOPIC VAGINAL HYSTERECTOMY WITH SALPINGO OOPHORECTOMY: SHX6681

## 2015-10-21 LAB — ABO/RH: ABO/RH(D): A POS

## 2015-10-21 LAB — PREGNANCY, URINE: PREG TEST UR: NEGATIVE

## 2015-10-21 LAB — TYPE AND SCREEN
ABO/RH(D): A POS
Antibody Screen: NEGATIVE

## 2015-10-21 SURGERY — HYSTERECTOMY, VAGINAL, LAPAROSCOPY-ASSISTED, WITH SALPINGO-OOPHORECTOMY
Anesthesia: General | Site: Bladder

## 2015-10-21 MED ORDER — DEXAMETHASONE SODIUM PHOSPHATE 4 MG/ML IJ SOLN
INTRAMUSCULAR | Status: AC
  Filled 2015-10-21: qty 1

## 2015-10-21 MED ORDER — GLYCOPYRROLATE 0.2 MG/ML IJ SOLN
INTRAMUSCULAR | Status: DC | PRN
  Administered 2015-10-21: 0.4 mg via INTRAVENOUS

## 2015-10-21 MED ORDER — KETOROLAC TROMETHAMINE 30 MG/ML IJ SOLN
INTRAMUSCULAR | Status: DC | PRN
  Administered 2015-10-21: 30 mg via INTRAVENOUS

## 2015-10-21 MED ORDER — DIPHENHYDRAMINE HCL 50 MG/ML IJ SOLN: 12.5000 mg | Freq: Four times a day (QID) | INTRAMUSCULAR | Status: DC | PRN

## 2015-10-21 MED ORDER — PROPOFOL 10 MG/ML IV BOLUS
INTRAVENOUS | Status: AC
  Filled 2015-10-21: qty 20

## 2015-10-21 MED ORDER — ROCURONIUM BROMIDE 100 MG/10ML IV SOLN
INTRAVENOUS | Status: DC | PRN
  Administered 2015-10-21: 40 mg via INTRAVENOUS
  Administered 2015-10-21: 10 mg via INTRAVENOUS

## 2015-10-21 MED ORDER — AMLODIPINE BESYLATE 5 MG PO TABS
5.0000 mg | ORAL_TABLET | Freq: Every day | ORAL | Status: DC
  Administered 2015-10-22: 5 mg via ORAL
  Filled 2015-10-21: qty 1

## 2015-10-21 MED ORDER — SCOPOLAMINE 1 MG/3DAYS TD PT72
1.0000 | MEDICATED_PATCH | Freq: Once | TRANSDERMAL | Status: DC
  Administered 2015-10-21: 1.5 mg via TRANSDERMAL

## 2015-10-21 MED ORDER — ONDANSETRON HCL 4 MG/2ML IJ SOLN
INTRAMUSCULAR | Status: AC
  Filled 2015-10-21: qty 2

## 2015-10-21 MED ORDER — HYDROMORPHONE HCL 1 MG/ML IJ SOLN
INTRAMUSCULAR | Status: DC | PRN
  Administered 2015-10-21: 1 mg via INTRAVENOUS

## 2015-10-21 MED ORDER — GLYCOPYRROLATE 0.2 MG/ML IJ SOLN
INTRAMUSCULAR | Status: AC
  Filled 2015-10-21: qty 2

## 2015-10-21 MED ORDER — SCOPOLAMINE 1 MG/3DAYS TD PT72
MEDICATED_PATCH | TRANSDERMAL | Status: AC
  Administered 2015-10-21: 1.5 mg via TRANSDERMAL
  Filled 2015-10-21: qty 1

## 2015-10-21 MED ORDER — ROCURONIUM BROMIDE 100 MG/10ML IV SOLN
INTRAVENOUS | Status: AC
  Filled 2015-10-21: qty 1

## 2015-10-21 MED ORDER — MIDAZOLAM HCL 2 MG/2ML IJ SOLN
INTRAMUSCULAR | Status: AC
  Filled 2015-10-21: qty 2

## 2015-10-21 MED ORDER — STERILE WATER FOR IRRIGATION IR SOLN
Status: DC | PRN
  Administered 2015-10-21: 1000 mL

## 2015-10-21 MED ORDER — FENTANYL CITRATE (PF) 250 MCG/5ML IJ SOLN
INTRAMUSCULAR | Status: AC
Start: 2015-10-21 — End: 2015-10-21
  Filled 2015-10-21: qty 5

## 2015-10-21 MED ORDER — BUPIVACAINE HCL (PF) 0.25 % IJ SOLN
INTRAMUSCULAR | Status: DC | PRN
  Administered 2015-10-21: 10 mL

## 2015-10-21 MED ORDER — LACTATED RINGERS IV SOLN
INTRAVENOUS | Status: DC
  Administered 2015-10-21 – 2015-10-22 (×2): via INTRAVENOUS

## 2015-10-21 MED ORDER — SODIUM CHLORIDE 0.9 % IJ SOLN: 9.0000 mL | INTRAMUSCULAR | Status: DC | PRN

## 2015-10-21 MED ORDER — LIDOCAINE HCL (CARDIAC) 20 MG/ML IV SOLN
INTRAVENOUS | Status: DC | PRN
  Administered 2015-10-21: 50 mg via INTRAVENOUS

## 2015-10-21 MED ORDER — LIDOCAINE-EPINEPHRINE 1 %-1:100000 IJ SOLN
INTRAMUSCULAR | Status: AC
Start: 2015-10-21 — End: 2015-10-21
  Filled 2015-10-21: qty 1

## 2015-10-21 MED ORDER — MIDAZOLAM HCL 2 MG/2ML IJ SOLN
INTRAMUSCULAR | Status: DC | PRN
  Administered 2015-10-21: 2 mg via INTRAVENOUS

## 2015-10-21 MED ORDER — OXYCODONE-ACETAMINOPHEN 5-325 MG PO TABS
1.0000 | ORAL_TABLET | ORAL | Status: DC | PRN
  Administered 2015-10-21 – 2015-10-22 (×2): 1 via ORAL
  Filled 2015-10-21 (×2): qty 1

## 2015-10-21 MED ORDER — DEXAMETHASONE SODIUM PHOSPHATE 10 MG/ML IJ SOLN
INTRAMUSCULAR | Status: DC | PRN
  Administered 2015-10-21: 4 mg via INTRAVENOUS

## 2015-10-21 MED ORDER — HYDROMORPHONE HCL 1 MG/ML IJ SOLN: 0.2500 mg | INTRAMUSCULAR | Status: DC | PRN

## 2015-10-21 MED ORDER — SODIUM CHLORIDE 0.9 % IR SOLN
Status: DC | PRN
  Administered 2015-10-21: 3000 mL

## 2015-10-21 MED ORDER — MORPHINE SULFATE 2 MG/ML IV SOLN
INTRAVENOUS | Status: DC
  Administered 2015-10-21: 4 mg via INTRAVENOUS
  Administered 2015-10-21: 15:00:00 via INTRAVENOUS
  Administered 2015-10-21: 3 mg via INTRAVENOUS
  Filled 2015-10-21: qty 25

## 2015-10-21 MED ORDER — NEOSTIGMINE METHYLSULFATE 10 MG/10ML IV SOLN
INTRAVENOUS | Status: DC | PRN
  Administered 2015-10-21: 3 mg via INTRAVENOUS

## 2015-10-21 MED ORDER — HYDROMORPHONE HCL 1 MG/ML IJ SOLN
INTRAMUSCULAR | Status: AC
  Filled 2015-10-21: qty 1

## 2015-10-21 MED ORDER — MEPERIDINE HCL 25 MG/ML IJ SOLN: 6.2500 mg | INTRAMUSCULAR | Status: DC | PRN

## 2015-10-21 MED ORDER — PROPOFOL 10 MG/ML IV BOLUS
INTRAVENOUS | Status: DC | PRN
  Administered 2015-10-21: 150 mg via INTRAVENOUS

## 2015-10-21 MED ORDER — ONDANSETRON HCL 4 MG/2ML IJ SOLN
INTRAMUSCULAR | Status: DC | PRN
  Administered 2015-10-21: 4 mg via INTRAVENOUS

## 2015-10-21 MED ORDER — NEOSTIGMINE METHYLSULFATE 10 MG/10ML IV SOLN
INTRAVENOUS | Status: AC
  Filled 2015-10-21: qty 1

## 2015-10-21 MED ORDER — DIPHENHYDRAMINE HCL 12.5 MG/5ML PO ELIX: 12.5000 mg | ORAL_SOLUTION | Freq: Four times a day (QID) | ORAL | Status: DC | PRN

## 2015-10-21 MED ORDER — LIDOCAINE HCL (CARDIAC) 20 MG/ML IV SOLN
INTRAVENOUS | Status: AC
  Filled 2015-10-21: qty 5

## 2015-10-21 MED ORDER — FENTANYL CITRATE (PF) 100 MCG/2ML IJ SOLN
INTRAMUSCULAR | Status: DC | PRN
  Administered 2015-10-21: 100 ug via INTRAVENOUS
  Administered 2015-10-21: 50 ug via INTRAVENOUS
  Administered 2015-10-21: 100 ug via INTRAVENOUS

## 2015-10-21 MED ORDER — METOCLOPRAMIDE HCL 5 MG/ML IJ SOLN
10.0000 mg | Freq: Once | INTRAMUSCULAR | Status: DC | PRN
Start: 2015-10-21 — End: 2015-10-21

## 2015-10-21 MED ORDER — ONDANSETRON HCL 4 MG/2ML IJ SOLN: 4.0000 mg | Freq: Four times a day (QID) | INTRAMUSCULAR | Status: DC | PRN

## 2015-10-21 MED ORDER — NALOXONE HCL 0.4 MG/ML IJ SOLN: 0.4000 mg | INTRAMUSCULAR | Status: DC | PRN

## 2015-10-21 MED ORDER — ALBUTEROL SULFATE (2.5 MG/3ML) 0.083% IN NEBU: 3.0000 mL | INHALATION_SOLUTION | RESPIRATORY_TRACT | Status: DC | PRN

## 2015-10-21 MED ORDER — LIDOCAINE-EPINEPHRINE 1 %-1:100000 IJ SOLN
INTRAMUSCULAR | Status: DC | PRN
  Administered 2015-10-21: 20 mL

## 2015-10-21 MED ORDER — LACTATED RINGERS IV SOLN
INTRAVENOUS | Status: DC
  Administered 2015-10-21: 125 mL/h via INTRAVENOUS
  Administered 2015-10-21 (×2): via INTRAVENOUS

## 2015-10-21 MED ORDER — BUPIVACAINE HCL (PF) 0.25 % IJ SOLN
INTRAMUSCULAR | Status: AC
  Filled 2015-10-21: qty 30

## 2015-10-21 SURGICAL SUPPLY — 51 items
CABLE HIGH FREQUENCY MONO STRZ (ELECTRODE) IMPLANT
CLOTH BEACON ORANGE TIMEOUT ST (SAFETY) ×4 IMPLANT
CONT PATH 16OZ SNAP LID 3702 (MISCELLANEOUS) ×4 IMPLANT
COVER BACK TABLE 60X90IN (DRAPES) ×4 IMPLANT
COVER MAYO STAND STRL (DRAPES) ×4 IMPLANT
DECANTER SPIKE VIAL GLASS SM (MISCELLANEOUS) ×8 IMPLANT
DRSG COVADERM PLUS 2X2 (GAUZE/BANDAGES/DRESSINGS) ×8 IMPLANT
DRSG OPSITE POSTOP 3X4 (GAUZE/BANDAGES/DRESSINGS) ×4 IMPLANT
DURAPREP 26ML APPLICATOR (WOUND CARE) ×4 IMPLANT
ELECT REM PT RETURN 9FT ADLT (ELECTROSURGICAL) ×4
ELECTRODE REM PT RTRN 9FT ADLT (ELECTROSURGICAL) ×3 IMPLANT
EVACUATOR SMOKE 8.L (FILTER) IMPLANT
GLOVE BIOGEL PI IND STRL 7.0 (GLOVE) ×9 IMPLANT
GLOVE BIOGEL PI IND STRL 8 (GLOVE) ×3 IMPLANT
GLOVE BIOGEL PI INDICATOR 7.0 (GLOVE) ×3
GLOVE BIOGEL PI INDICATOR 8 (GLOVE) ×1
GLOVE ECLIPSE 7.5 STRL STRAW (GLOVE) ×8 IMPLANT
HEMOSTAT SURGICEL 2X14 (HEMOSTASIS) ×4 IMPLANT
LEGGING LITHOTOMY PAIR STRL (DRAPES) ×4 IMPLANT
LIQUID BAND (GAUZE/BANDAGES/DRESSINGS) ×4 IMPLANT
NS IRRIG 1000ML POUR BTL (IV SOLUTION) ×4 IMPLANT
PACK LAVH (CUSTOM PROCEDURE TRAY) ×4 IMPLANT
PACK ROBOTIC GOWN (GOWN DISPOSABLE) ×4 IMPLANT
PAD POSITIONING PINK XL (MISCELLANEOUS) ×4 IMPLANT
SET CYSTO W/LG BORE CLAMP LF (SET/KITS/TRAYS/PACK) ×4 IMPLANT
SET IRRIG TUBING LAPAROSCOPIC (IRRIGATION / IRRIGATOR) ×4 IMPLANT
SHEARS HARMONIC ACE PLUS 36CM (ENDOMECHANICALS) ×4 IMPLANT
SLEEVE XCEL OPT CAN 5 100 (ENDOMECHANICALS) ×4 IMPLANT
STRIP CLOSURE SKIN 1/4X3 (GAUZE/BANDAGES/DRESSINGS) IMPLANT
SUT VIC AB 0 CT1 18XCR BRD8 (SUTURE) ×6 IMPLANT
SUT VIC AB 0 CT1 27 (SUTURE)
SUT VIC AB 0 CT1 27XBRD ANBCTR (SUTURE) IMPLANT
SUT VIC AB 0 CT1 36 (SUTURE) ×4 IMPLANT
SUT VIC AB 0 CT1 8-18 (SUTURE) ×2
SUT VIC AB 2-0 SH 27 (SUTURE)
SUT VIC AB 2-0 SH 27XBRD (SUTURE) IMPLANT
SUT VIC AB 3-0 CT1 27 (SUTURE)
SUT VIC AB 3-0 CT1 TAPERPNT 27 (SUTURE) IMPLANT
SUT VIC AB 3-0 SH 27 (SUTURE)
SUT VIC AB 3-0 SH 27X BRD (SUTURE) IMPLANT
SUT VICRYL 0 TIES 12 18 (SUTURE) ×4 IMPLANT
SUT VICRYL 0 UR6 27IN ABS (SUTURE) ×4 IMPLANT
SUT VICRYL RAPIDE 3 0 (SUTURE) ×8 IMPLANT
SYR BULB IRRIGATION 50ML (SYRINGE) ×4 IMPLANT
TOWEL OR 17X24 6PK STRL BLUE (TOWEL DISPOSABLE) ×12 IMPLANT
TRAY FOLEY CATH SILVER 14FR (SET/KITS/TRAYS/PACK) ×4 IMPLANT
TROCAR BALLN 12MMX100 BLUNT (TROCAR) IMPLANT
TROCAR XCEL NON-BLD 11X100MML (ENDOMECHANICALS) ×4 IMPLANT
TROCAR XCEL NON-BLD 5MMX100MML (ENDOMECHANICALS) ×4 IMPLANT
WARMER LAPAROSCOPE (MISCELLANEOUS) ×4 IMPLANT
WATER STERILE IRR 1000ML POUR (IV SOLUTION) IMPLANT

## 2015-10-21 NOTE — Progress Notes (Signed)
Eda, Spanish Interpreter, present at bedside for patient admission to [facility]. PCA use reviewed, and all questions answered.

## 2015-10-21 NOTE — Progress Notes (Signed)
I ordered patient's meals by Juliann Mule Spanish Interpreter, by Juliann Mule.

## 2015-10-21 NOTE — Anesthesia Preprocedure Evaluation (Signed)
Anesthesia Evaluation  Patient identified by MRN, date of birth, ID band Patient awake    Reviewed: Allergy & Precautions, NPO status , Patient's Chart, lab work & pertinent test results, reviewed documented beta blocker date and time   Airway Mallampati: II  TM Distance: >3 FB Neck ROM: Full    Dental no notable dental hx. (+) Teeth Intact   Pulmonary shortness of breath, pneumonia, resolved,    Pulmonary exam normal breath sounds clear to auscultation       Cardiovascular hypertension, Pt. on medications Normal cardiovascular exam Rhythm:Regular Rate:Normal     Neuro/Psych negative neurological ROS  negative psych ROS   GI/Hepatic negative GI ROS, Neg liver ROS,   Endo/Other  Obesity  Renal/GU   negative genitourinary   Musculoskeletal negative musculoskeletal ROS (+)   Abdominal (+) + obese,   Peds  Hematology  (+) anemia ,   Anesthesia Other Findings   Reproductive/Obstetrics Dysmenorrhea Menorrhagia Uterine Fibroids                             Anesthesia Physical Anesthesia Plan  ASA: II  Anesthesia Plan: General   Post-op Pain Management:    Induction: Intravenous  Airway Management Planned: Oral ETT  Additional Equipment:   Intra-op Plan:   Post-operative Plan: Extubation in OR  Informed Consent: I have reviewed the patients History and Physical, chart, labs and discussed the procedure including the risks, benefits and alternatives for the proposed anesthesia with the patient or authorized representative who has indicated his/her understanding and acceptance.   Dental advisory given  Plan Discussed with: CRNA, Anesthesiologist and Surgeon  Anesthesia Plan Comments:         Anesthesia Quick Evaluation

## 2015-10-21 NOTE — Interval H&P Note (Signed)
History and Physical Interval Note:  10/21/2015 10:02 AM  Stephanie Gibson  has presented today for surgery, with the diagnosis of dysmenorrhea,menorrhagia, fibroids  The various methods of treatment have been discussed with the patient and family. After consideration of risks, benefits and other options for treatment, the patient has consented to  Laparoscopic assisted vaginal hysterectomy with right salpingoophorectromy along with cystoscopy as a surgical intervention .  The patient's history has been reviewed, patient examined, no change in status, stable for surgery.  I have reviewed the patient's chart and labs.  Questions were answered to the patient's satisfaction.     Terrance Mass

## 2015-10-21 NOTE — Interval H&P Note (Signed)
History and Physical Interval Note:  10/21/2015 10:05 AM  Stephanie Gibson  has presented today for surgery, with the diagnosis of dysmenorrhea,menorrhagia, fibroids  The various methods of treatment have been discussed with the patient and family. After consideration of risks, benefits and other options for treatment, the patient has consented to  Procedure(s): [place] (Bilateral) CYSTOSCOPY (N/A) as a surgical intervention .  The patient's history has been reviewed, patient examined, no change in status, stable for surgery.  I have reviewed the patient's chart and labs.  Questions were answered to the patient's satisfaction.  Correction  LAVH/RSO/Cystoscopy   Terrance Mass

## 2015-10-21 NOTE — Addendum Note (Signed)
Addendum  created 10/21/15 1820 by Flossie Dibble, CRNA   Modules edited: Notes Section   Notes Section:  File: [id number]

## 2015-10-21 NOTE — Transfer of Care (Signed)
Immediate Anesthesia Transfer of Care Note  Patient: Stephanie Gibson  Procedure(s) Performed: Procedure(s): LAPAROSCOPIC ASSISTED VAGINAL HYSTERECTOMY (N/A) CYSTOSCOPY (N/A) SALPINGO OOPHORECTOMY (Left)  Patient Location: PACU  Anesthesia Type:General  Level of Consciousness: awake, alert  and oriented  Airway & Oxygen Therapy: Patient Spontanous Breathing and Patient connected to nasal cannula oxygen  Post-op Assessment: Report given to RN and Post -op Vital signs reviewed and stable  Post vital signs: Reviewed and stable  Last Vitals:  Filed Vitals:   10/21/15 0906  BP: 149/77  Pulse: 75  Temp: 36.8 C  Resp: 20    Complications: No apparent anesthesia complications

## 2015-10-21 NOTE — Op Note (Signed)
Operative Note  10/21/2015  1:11 PM  PATIENT:  Stephanie Gibson  49 y.o. female  PRE-OPERATIVE DIAGNOSIS:  dysmenorrhea,menorrhagia, fibroids, anemia  POST-OPERATIVE DIAGNOSIS:  dysmenorrhea,menorrhagia, fibroids, anemia  PROCEDURE:  Procedure(s): LAPAROSCOPIC ASSISTED VAGINAL HYSTERECTOMY, left salpingo-oophorectomy CYSTOSCOPY   SURGEON:  Surgeon(s): Terrance Mass, MD Anastasio Auerbach, MD  ANESTHESIA:   general  FINDINGS: Mild left pelvic sidewall adhesions. Left tube and ovary present.: Lip was noted proximal portion of left fallopian tube. Absence of right tube and ovary. No endometriosis in the anterior or posterior cul-de-sac. Smooth liver surface and gallbladder visualized. Appendix not seen.  DESCRIPTION OF OPERATION:After adequate general endotracheal anesthesia, the patient was placed in dorsal lithotomy position, prepped and draped in the usual manner for a laparoscopic procedure. Patient received her prophylactic antibiotic and had PAS stockings in place as well.  A time out was undertaken for proper identification of the patient and procedure to be performed. A speculum was placed into the vagina. A bimanual exam was performed and then then  A single tooth tenaculum was utilized to grasp the anterior lip of the uterine cervix. The uterus was sounded to  7-1/2 cm The single-tooth tenaculum and speculum were removed from the vagina. A foley catheter was inserted to monitor urinary output.  At this time, an infraumbilical  skin incision was made through which a 10/11-mm  Opti-View trocar was introduced through the infraumbilical incision into the abdominal cavity. A pneumoperitoneum was established  With CO2 for approximately 2 1/2 liters. Through the trocar sheath, the laparoscope was inserted and adequate visualization of the pelvic structures was noted. Two  5-mm skin incision were made on the patients right and left lower abdomin under laparoscopic guidance and  two   5-mm trocars were  introduced into the abdominal cavity for instrumentation. Evaluation of the pelvis revealed the following:   Mild left pelvic sidewall adhesions. Left tube and ovary present.: Lip was noted proximal portion of left fallopian tube. Absence of right tube and ovary. No endometriosis in the anterior or posterior cul-de-sac. Smooth liver surface and gallbladder visualized. Appendix not seen.  The ureters were noted to be deep in the pelvis. At this time, attention was placed to the left tube and ovary. Mild pelvic adhesions were lysed. The left tube and ovary was put under traction to identify the left infundibulopelvic ligament. The left ureter was noted medial and distal from the infundibulopelvic ligament. The left infundibulopelvic ligament was then coaptated and transected with Harmonic scalpel.  The remainder of the uterine vessels and anterior and posterior leaves of the broad ligament, as well as the cardinal ligaments were  transected in a serial fashion down to level of the uterine artery. Due to the absence of the right adnexa the right round ligament was coaptated and transected with Harmonic scalpel. The anterior leaf of the broad ligament was then dissected to the midline bilaterally, establishing a bladder flap with a combination of blunt and sharp dissection. At this time, attention was made to the vaginal hysterectomy. The laparoscope was removed and attention was made to the vaginal hysterectomy.  [place] Tenaculum  was removed and the anterior and posterior leafs of the cervix were grasped with Lahey tenaculum. A circumferential injection with  1% Lidocaine with 1:100,000  Epinephrine dilution  Was injected into  the cervicovaginal portio for 20 cc's. . A circumferential incision was then made at the cervicovaginal portio. The anterior and posterior colpotomies were accomplished with a combination of blunt and sharp dissection  without difficulty. The right uterosacral ligament was  clamped, transected, and ligated with #0 Vicryl sutures. The left uterosacral ligament was clamped, transected, and ligated with #0 Vicryl suture. The parametrial tissue was then clamped bilaterally, transected, and ligated with #0 Vicryl suture bilaterally. The uterus along with the left tube and ovary was then removed and passed off the operative field. Laparotomy pack was placed into the pelvis. The pedicles were evaluated. There was no bleeding noted; therefore, the laparotomy pack was removed. The uterosacral ligaments were suture fixated into the vaginal cuff angles with #0 Vicryl sutures. The vaginal cuff was then closed Hemostasis was noted throughout. The posterior peritoneum was secured to the vagina with a running stitch of 0-Vicryl from 3-9 o'clock. The vaginal cuff was then closed with interrupted figure of eights with 0-Vicryl.At this time,  The Foley catheter was removed. A 70 cystoscope was introduced into the bladder. The bladder mucosa was intact. Both ureteral for office demonstrated patency as the orange-colored urine from previous peridium the patient had taken orally for identification was noted.   The laparoscope was then reinserted into the abdomen. The abdomen was reinsufflated. Evaluation revealed no further bleeding. Irrigation with sterile water was performed and again no bleeding was noted. Surgicel was placed at the vaginal cuff for additional hemostasis. The  5 mm trocar sheaths  were then removed under laparoscopic visualization. The laparoscope was removed. The carbon dioxide was allowed to escape from the abdomen and the infraumbilical trocar sheath was then removed. The skin incisions were closed with 0-vicryl at the subumbilical incision site and the skin as well as the 5 mm ports were reapproximated with Dermabon Glue. Neosporin and small dressings applied. There were no complications. The instrument, sponge, and needle counts were correct. PAtient was extubated and  transferred to recovery room.   ESTIMATED BLOOD LOSS: 200 cc  Intake/Output Summary (Last 24 hours) at 10/21/15 1311 Last data filed at 10/21/15 1257  Gross per 24 hour  Intake   1700 ml  Output    600 ml  Net   1100 ml     BLOOD ADMINISTERED:none   LOCAL MEDICATIONS USED:  MARCAINE   0.25% Marcaine injected into the 3 incision ports for a total of 10 cc. 1% lidocaine with 1 100,000 epinephrine was infiltrated into the cervical vaginal stroma at the 2, 4, 8, and 10:00 position of the cervix for a total of 20 cc  SPECIMEN:  Source of Specimen:  Uterus, cervix, left tube and ovary  DISPOSITION OF SPECIMEN:  PATHOLOGY  COUNTS:  YES  PLAN OF CARE: Transfer to [place] HMD1:11 PMTD@

## 2015-10-21 NOTE — Anesthesia Postprocedure Evaluation (Signed)
Anesthesia Post Note  Patient: Stephanie Gibson  Procedure(s) Performed: Procedure(s) (LRB): LAPAROSCOPIC ASSISTED VAGINAL HYSTERECTOMY (N/A) CYSTOSCOPY (N/A) SALPINGO OOPHORECTOMY (Left)  Patient location during evaluation: PACU Anesthesia Type: General Level of consciousness: awake Pain management: pain level controlled Vital Signs Assessment: post-procedure vital signs reviewed and stable Respiratory status: spontaneous breathing Cardiovascular status: stable Postop Assessment: no signs of nausea or vomiting and adequate PO intake Anesthetic complications: no    Last Vitals:  Filed Vitals:   10/21/15 1400 10/21/15 1412  BP: 130/71   Pulse: 114 92  Temp:    Resp: 18 15    Last Pain: There were no vitals filed for this visit.               [place]

## 2015-10-21 NOTE — H&P (View-Only) (Signed)
Patient is a 49 year old gravida 5 para 2 AB 3 (1 ectopic pregnancy/left LSO, 1 spontaneous AB, one elective AB) who has been dealing with menorrhagia and anemia as a result of fibroid uterus for quite some time now. In August of this year patient had an ultrasound which demonstrated the following:  a uterus with the measurement of 9.5 x 7.3 x 5.5 cm an endometrial stripe of 9.6 mm she's found to have 2 small fibroids the largest measuring 24 x 19 mm intramural left the room and normal in the right corpus luteum cyst measuring 20 x 17 mm was noted right adnexa normal no free fluid seen in the cul-de-sac. During that office issue she had an endometrial biopsy which demonstrated a polypoid endometrium with secretory activity no hyperplasia or malignancy identified.   As a result of her menorrhagia and severe anemia she has been transfused in the past. She was currently on Megace 40 mg twice a day. On October 10 of this year and a Mirena IUD was placed. She stated 3 days ago she exposed the IUD. After passage of large clots. Review of her record indicated that her hemoglobin on October 5 was 8.2 and yesterday was 12.2 with a platelet count 208,000. PT PTT and fibrinogen INR were all normal.  Endometrial biopsy July 10 this year demonstrated the following: Diagnosis Endometrium, biopsy - POLYPOID ENDOMETRIUM WITH SECRETORY ACTIVITY. - NO HYPERPLASIA, ATYPIA OR MALIGNANCY IDENTIFIED.  We  Had placed a Mirena IUD in an effort to control her bleeding prior to her surgery as well as for contraception.  Since she has exposed it we are going to put a new one under ultrasound guidance today  and schedule her surgery within the next month. In the meantime she'll continue to take her iron tablet one twice a day. She'll take the Megace 40 mg twice a day for 3 more days then stop.    The ultrasound room: the patient's cervix was cleansed with Betadine  Solution an asymmetry tenaculum was placed on the anterior  cervical lip. On ultrasound it appeared to be a large clot sitting inside the uterine cavity. A  Vabra  Aspiration was undertaken to remove the clots from the uterine cavity to be able to place a Mirena IUD under ultrasound guidance. The tissue was submitted for histological evaluation. Upon repeating this ultrasound scan the cavity was cleared of the large clot. And the Mirena IUD was placed in the following fashion:  Ultrasound report: Uterus measured 10.3 x 7.4 x 5.6 cm within mutual stripe 26.7 mm. Prominent endometrium with fluid fill in the cavity was reported defect avascular 58 x 20 mm. Post VABRA endometrium within normal limits. 3.6 nontender. Right ovary not seen. Left ovary normal. Negative right adnexa. The IUD was placed under ultrasound guidance:                                                                    IUD procedure note        The patient had previously been provided with literature information on this method of contraception. The risks benefits and pros and cons were discussed and all her questions were answered. She is fully aware that this form of contraception is 99% effective and is  good for  5 years.  Pelvic exam: Bartholin urethra Skene glands: Within normal limits Vagina: No lesions or discharge Cervix: No lesions or discharge Uterus:  anteverted position Adnexa: No masses or tenderness Rectal exam: Not done  The cervix was cleansed with Betadine solution. A single-tooth tenaculum was placed on the anterior cervical lip. The uterus sounded to  8/2 centimeter. The IUD was shown to the patient and inserted in a sterile fashion. The IUD string was trimmed. The single-tooth tenaculum was removed.   Patient will be scheduled for her hysterectomy on December 20. Meanwhile she will continue with her iron tablet one twice a day. For prophylaxis as a result of the suctioning of her uterine cavity to remove the blood clots she is going to be placed on Vibramycin 100 mg  twice a day for 7 days. She will continue the Megace 40 mg twice a day till the end of the week. She will be seen in the office a week of surgery for preop consultation , examination, and repeating her CBC. Literature information was provided her laparoscopic-assisted vaginal hysterectomy with bilateral salpingo-oophorectomy in Spanish.

## 2015-10-21 NOTE — Anesthesia Postprocedure Evaluation (Signed)
Anesthesia Post Note  Patient: Stephanie Gibson  Procedure(s) Performed: Procedure(s) (LRB): LAPAROSCOPIC ASSISTED VAGINAL HYSTERECTOMY (N/A) CYSTOSCOPY (N/A) SALPINGO OOPHORECTOMY (Left)  Patient location during evaluation: [facility] Anesthesia Type: General Level of consciousness: awake and alert Pain management: satisfactory to patient Vital Signs Assessment: post-procedure vital signs reviewed and stable Respiratory status: spontaneous breathing and respiratory function stable Cardiovascular status: stable Postop Assessment: adequate PO intake Anesthetic complications: no    Last Vitals:  Filed Vitals:   10/21/15 1645 10/21/15 1808  BP: 127/70   Pulse: 89   Temp: 37.2 C   Resp: 16 20    Last Pain:  Filed Vitals:   10/21/15 1808  PainSc: 5                  Chardai Gangemi

## 2015-10-21 NOTE — Anesthesia Procedure Notes (Signed)
Procedure Name: Intubation Date/Time: 10/21/2015 10:51 AM Performed by: Jonna Munro Pre-anesthesia Checklist: Patient identified, Emergency Drugs available, Suction available, Patient being monitored and Timeout performed Patient Re-evaluated:Patient Re-evaluated prior to inductionOxygen Delivery Method: Circle system utilized Preoxygenation: Pre-oxygenation with 100% oxygen Intubation Type: IV induction Ventilation: Mask ventilation without difficulty Laryngoscope Size: Mac and 3 Grade View: Grade II Tube type: Oral Tube size: 7.0 mm Number of attempts: 1 Airway Equipment and Method: Stylet Placement Confirmation: ETT inserted through vocal cords under direct vision Secured at: 21 cm Tube secured with: Tape Dental Injury: Teeth and Oropharynx as per pre-operative assessment

## 2015-10-22 ENCOUNTER — Encounter (HOSPITAL_COMMUNITY): Payer: Self-pay | Admitting: Gynecology

## 2015-10-22 DIAGNOSIS — N920 Excessive and frequent menstruation with regular cycle: Secondary | ICD-10-CM | POA: Diagnosis not present

## 2015-10-22 LAB — CBC
HEMATOCRIT: 28.8 % — AB (ref 36.0–46.0)
Hemoglobin: 9.6 g/dL — ABNORMAL LOW (ref 12.0–15.0)
MCH: 26.3 pg (ref 26.0–34.0)
MCHC: 33.3 g/dL (ref 30.0–36.0)
MCV: 78.9 fL (ref 78.0–100.0)
PLATELETS: 191 10*3/uL (ref 150–400)
RBC: 3.65 MIL/uL — AB (ref 3.87–5.11)
RDW: 19 % — ABNORMAL HIGH (ref 11.5–15.5)
WBC: 8.7 10*3/uL (ref 4.0–10.5)

## 2015-10-22 NOTE — Progress Notes (Signed)
Discharge instructions reviewed with patient.  Patient states understanding of home care, medications, activity, signs/symptoms to report to MD and return MD office visit.  Patients significant other and family will assist with her care @ home.  No home  equipment needed, patient has prescriptions and all personal belongings.  Patient discharged per wheelchair in stable condition with staff without incident.  

## 2015-10-22 NOTE — Discharge Summary (Signed)
Physician Discharge Summary  Patient ID: Stephanie Gibson MRN: [id number] DOB/AGE: [birth year] 50 y.o.  Admit date: 10/21/2015 Discharge date: 10/22/2015  Admission Diagnoses: Menorrhagia, pelvic pain, anemia  Discharge Diagnoses: Same as above Active Problems:   Postoperative state   Discharged Condition: good  Hospital Course: Patient was admitted to the hospital on December 13 where she underwent a laparoscopic-assisted vaginal hysterectomy and left salpingo-oophorectomy along with cystoscopy. At time of surgery was evident the patient had a previous right salpingo-oophorectomy as a result of a prior ectopic pregnancy which patient had reported preoperatively. Intraoperatively patient did well had EBL of 200 cc. She was With a PCA pump overnight. She had a good urinary output and her Foley catheter was discontinued at 0500 hrs. and has voided spontaneously this passing gas and her diet was advanced that she is tolerating regular diet. She is ambulating and ready to be discharged home today.  Consults: None  Significant Diagnostic Studies: labs: Preoperative hemoglobin 11.5 postop hemoglobin 9.6  Treatments: surgery: Lumbar colposcopic-assisted vaginal hysterectomy with left salpingo-oophorectomy and cystoscopy  Discharge Exam: Blood pressure 115/49, pulse 71, temperature 98.9 F (37.2 C), temperature source Oral, resp. rate 16, height 5\' 3"  (1.6 m), weight 175 lb (79.379 kg), last menstrual period 08/10/2015, SpO2 97 %. General appearance: alert and cooperative Cardio: regular rate and rhythm, S1, S2 normal, no murmur, click, rub or gallop GI: soft, non-tender; bowel sounds normal; no masses,  no organomegaly Extremities: extremities normal, atraumatic, no cyanosis or edema Incision/Wound: incision ports intact  Disposition: 01-Home or Self Care  Discharge Instructions    Call MD for:  difficulty breathing, headache or visual disturbances    Complete by:  As directed      Call MD for:  hives    Complete by:  As directed      Call MD for:  persistant dizziness or light-headedness    Complete by:  As directed      Call MD for:  persistant nausea and vomiting    Complete by:  As directed      Call MD for:  redness, tenderness, or signs of infection (pain, swelling, redness, odor or green/yellow discharge around incision site)    Complete by:  As directed      Call MD for:  severe uncontrolled pain    Complete by:  As directed      Call MD for:  temperature >100.4    Complete by:  As directed      Diet general    Complete by:  As directed      Discharge instructions    Complete by:  As directed   Cita con Dr. Carnella [place] 5 a las 3 pm Indicaciones postquirrgicas generales (Adultos) (Postsurgical Instructions, General, Adult)   Puede sentirse mareado, dbil y somnoliento en las 24 horas posteriores a la anestesia. La siguiente informacin es vlida para el perodo de Education officer, environmental las primeras 24 horas luego de la [place]. No conduzca automviles ni bicicletas, no participe en actividades en las que pueda lastimarse, no tome transportes pblicos hasta que suspenda los analgsicos opiceos para Best boy y Cabin crew el consentimiento del profesional que lo asiste.  No beba alcohol, no tome tranquilizantes o medicamentos que no hayan sido prescritos o permitidos por el cirujano.  No tome medicamentos que no hayan sido recetados por el profesional que lo asiste.  No firme papeles importantes o contratos por al [employer] durante 24 horas o mientras se encuentre bajo los [employer]  medicamentos para Conservation officer, historic buildings.  Haga que una persona responsable lo acompae.  CUIDADOS DE LA HERIDA: Cambie el vendaje cuando lo necesite o cuando se le haya indicado.  Es preferible una ducha a un bao de inmersin; sin embargo, consltelo con el profesional que lo asiste si tiene tubos insertados en la herida.  Evite levantar objetos pesados (ms de 4 kilos [10 libra]),  empujarlos o jalar.  Evite las actividades que puedan poner daar la incisin (el corte realizado por el cirujano).  Solo tome medicamentos de venta libre o recetados para Conservation officer, historic buildings, Health and safety inspector o la [place], segn le haya indicado el mdico. No tome aspirina. Puede provocar sangrado. Tome los medicamentos que destruyen grmenes (antibiticos) segn le hayan indicado.  BUSQUE ATENCIN MDICA SI: Siente malestar estomacal (nuseas).  Comienza a vomitar.  No puede comer o beber.  Usted tiene una temperatura oral de ms de 100.  Tiene estreimiento y no obtuvo alivio al [employer] la dieta o al aumentar la ingestin de lquidos. Los medicamentos para el dolor son Ardelia Mems causa frecuente de estreimiento.  SOLICITE ATENCIN MDICA DE INMEDIATO SI: Siente un mareo persistente.  Tiene dificultad para respirar o tos hmeda (congestiva).  Usted tiene una temperatura oral de ms de 100 y no puede controlarla con medicamentos.  Aumenta el dolor o la sensibilidad cerca de la incisin.  Document Released: 08/04/2005 Document Re-Released: 04/14/2010 ExitCare Patient Information 2011 [employer]   [facility] HMD7:56 AMTD@     Driving Restrictions    Complete by:  As directed   1 week     Increase activity slowly    Complete by:  As directed      Lifting restrictions    Complete by:  As directed   6 weeks     Sexual Activity Restrictions    Complete by:  As directed   6 weeks            Medication List    STOP taking these medications        megestrol 40 MG tablet  Commonly known as:  MEGACE     phenazopyridine 200 MG tablet  Commonly known as:  PYRIDIUM      TAKE these medications        albuterol 108 (90 BASE) MCG/ACT inhaler  Commonly known as:  PROVENTIL HFA;VENTOLIN HFA  Inhale 2 puffs into the lungs every 2 (two) hours as needed for wheezing or shortness of breath (or coughing).     amLODipine 5 MG tablet  Commonly known as:  NORVASC  Take 5 mg by mouth daily.      ferrous sulfate 325 (65 FE) MG tablet  Take 1 tablet (325 mg total) by mouth 3 (three) times daily with meals.     oxyCODONE-acetaminophen 5-325 MG tablet  Commonly known as:  PERCOCET  Take 1 tablet by mouth every 4 (four) hours as needed for severe pain.         SignedTerrance Mass 10/22/2015, 8:12 AM

## 2015-11-13 ENCOUNTER — Encounter: Payer: Self-pay | Admitting: Gynecology

## 2015-11-13 ENCOUNTER — Ambulatory Visit (INDEPENDENT_AMBULATORY_CARE_PROVIDER_SITE_OTHER): Payer: BLUE CROSS/BLUE SHIELD | Admitting: Gynecology

## 2015-11-13 VITALS — BP 134/86

## 2015-11-13 DIAGNOSIS — D62 Acute posthemorrhagic anemia: Secondary | ICD-10-CM

## 2015-11-13 DIAGNOSIS — Z09 Encounter for follow-up examination after completed treatment for conditions other than malignant neoplasm: Secondary | ICD-10-CM

## 2015-11-13 LAB — CBC WITH DIFFERENTIAL/PLATELET
BASOS ABS: 0.1 10*3/uL (ref 0.0–0.1)
Basophils Relative: 1 % (ref 0–1)
EOS ABS: 0.2 10*3/uL (ref 0.0–0.7)
Eosinophils Relative: 3 % (ref 0–5)
HEMATOCRIT: 39.2 % (ref 36.0–46.0)
HEMOGLOBIN: 13.3 g/dL (ref 12.0–15.0)
LYMPHS ABS: 2 10*3/uL (ref 0.7–4.0)
LYMPHS PCT: 24 % (ref 12–46)
MCH: 26.9 pg (ref 26.0–34.0)
MCHC: 33.9 g/dL (ref 30.0–36.0)
MCV: 79.4 fL (ref 78.0–100.0)
MONOS PCT: 8 % (ref 3–12)
Monocytes Absolute: 0.7 10*3/uL (ref 0.1–1.0)
NEUTROS ABS: 5.3 10*3/uL (ref 1.7–7.7)
NEUTROS PCT: 64 % (ref 43–77)
Platelets: 180 10*3/uL (ref 150–400)
RBC: 4.94 MIL/uL (ref 3.87–5.11)
RDW: 17.2 % — ABNORMAL HIGH (ref 11.5–15.5)
WBC: 8.3 10*3/uL (ref 4.0–10.5)

## 2015-11-13 MED ORDER — EST ESTROGENS-METHYLTEST 0.625-1.25 MG PO TABS: 1.0000 | ORAL_TABLET | Freq: Every day | ORAL | Status: DC

## 2015-11-13 NOTE — Progress Notes (Signed)
   Patient is a 50 year old who presented to the office for her 3 week postop visit. On 10/20/2014 patient underwent laparoscopic-assisted vaginal hysterectomy with left salpingo-oophorectomy and cystoscopy as a result of her this menorrhea, menorrhagia, fibroid uterus and anemia. In [place] many years ago she had had a right salpingo-oophorectomy. Patient's doing well she was asymptomatic today with exception expires even hot flashes and not sleeping well as a result of her surgical menopause and wanted some help with the symptoms. Findings from surgery as well as pictures and pathology report was discussed with her as follows:  FINDINGS: Mild left pelvic sidewall adhesions. Left tube and ovary present.: Clip was noted proximal portion of left fallopian tube. Absence of right tube and ovary. No endometriosis in the anterior or posterior cul-de-sac. Smooth liver surface and gallbladder visualized. Appendix not seen. Normal-appearing uterus.  Pathology report: Diagnosis Uterus and cervix, with left fallopian tube and ovary - CERVIX: UNREMARKABLE. - ENDOMETRIUM: INACTIVE APPEARING ENDOMETRIUM. - MYOMETRIUM: ADENOMYOSIS. - SEROSA: ADHESIONS. - LEFT ADNEXA: BENIGN OVARY AND FALLOPIAN TUBE.  Exam: Abdomen: Soft nontender no rebound or guarding incision ports healed Pelvic: Bartholin urethra Skene was within normal limits Vagina: No lesions or discharge Vaginal cuff intact suture material noted no bleeding Bimanual exam no palpable mass or tenderness Rectal exam not done  Assessment/plan: Patient 3 weeks status post laparoscopic-assisted vaginal hysterectomy with left salpingo-oophorectomy as a result of her dysmenorrhea, menorrhagia/adenomyosis and anemia doing well. Preoperative hemoglobin 11.5 postop hemoglobin 9.6 g we'll check CBC today and patient to return back in 3 weeks for final postop visit. We discussed hormonal replacement therapy and risk benefits and pros and cons including the women's  health initiative study and potential risk of breast cancer as well as DVT and pulmonary embolism. Patient fully understands and accepts. Literature information was provided in [place]. Patient will be prescribed Estratest 6.25 mg 1 by mouth daily.

## 2015-11-13 NOTE — Patient Instructions (Signed)
Terapia de reemplazo hormonal (Hormone Therapy) En la menopausia, su cuerpo comienza a producir menos estrgeno y progesterona. Esto provoca que el cuerpo deje de tener perodos menstruales. Esto se debe a que el estrgeno y la progesterona controlan sus perodos y su ciclo menstrual. Una falta de estrgeno puede causar sntomas tales como:  Golpes calor.  Sequedad vaginal  Piel seca.  Prdida del deseo sexual.  Riesgo de prdida de hueso (osteoporosis). Cuando esto ocurre, puede elegir realizar una terapia hormonal para volver a obtener el estrgeno perdido durante la menopausia. Cuando slo se introduce esta hormona, el procedimiento se conoce normalmente como TRE (terapia de reemplazo de estrgeno). Cuando la hormona progestina se combina con el estrgeno, el procedimiento se conoce normalmente como TH (terapia hormonal). Esto es lo que previamente se conoca como terapia de reemplazo hormonal (TRH). El profesional que le asiste le ayudar a tomar una decisin acerca de lo que resulte lo mejor para usted. La decisin de realizar una TRH cambia a menudo debido a que se realizan nuevos exmenes. Muchos estudios no ponen de acuerdo con respecto a los beneficios de realizar una terapia de reemplazo hormonal.  BENEFICIOS PROBABLES DE LA TRH QUE INCLUYEN PROTECCIN CONTRA:  Golpes de calor - Un golpe de calor es la sensacin repentina de calor sobre la cara y el cuerpo. La piel enrojece, como al sonrojarse. Estn asociados con la transpiracin y los trastornos del sueo. Las mujeres que atraviesan la menopausia pueden tener golpes de calor unas pocas veces en el mes o varios al da; esto depende de la mujer.  Osteoporosis (prdida de hueso) - El estrgeno ayuda a protegerse contra la prdida de hueso. Luego de la menopausia, los huesos de una mujer pierden calcio y se vuelven frgiles y quebradizos. Como resultado, es ms probable que el hueso se quiebre. Los que resultan afectados con mayor  frecuencia son los de la cadera, la mueca y la columna vertebral. La terapia hormonal puede ayudar a retardar la prdida de hueso luego de la menopausia. Realizar ejercicios con peso y tomar calcio con vitamina D tambin puede ayudar a prevenir la prdida de hueso. Existen medicamentos que puede prescribir el profesional que la asiste para ayudar a prevenir la osteoporosis.  Sequedad vaginal - La prdida de estrgeno produce cambios en la vagina. El recubrimiento de la misma puede volverse fino y reseco. Estos cambios pueden causar dolor y sangrado durante las relaciones sexuales. La sequedad tambin puede producir una infeccin. Puede ocasionarle ardor y picazn.  Las infecciones en las vas urinarias son ms comunes luego de la menopausia debido a la falta de estrgeno.  Otros beneficios posibles del estrgeno incluyen un cambio positivo en el humor y en la memoria de corto plazo en las mujeres. EFECTOS SECUNDARIOS Y RIESGOS  Utilizar estrgeno slo sin progesterona causa que el recubrimiento del tero crezca. Esto aumenta el riesgo de cncer endometrial. El profesional que la asiste deber darle otra hormona llamada progestina, si usted tiene tero.  Las mujeres que realizan una TH combinada (estrgeno y progestina) parecen tener un mayor riesgo de sufrir cncer de mama. El riesgo parece ser pequeo, pero aumenta a lo largo del tiempo que se realice la TH.  La terapia combinada tambin hace que el tejido mamario sea levemente ms denso, lo que hace que sea ms difcil leer mamografas (radiografas de mama).  Combinada, la terapia de estrgeno y progesterona puede realizarse todos los das, en cuyo caso podrn aparecer manchas de sangre. La TH puede realizarse de   manera cclica, en cuyo caso tendr perodos menstruales.  La TH puede aumentar el riesgo de infarto, ataque cardaco, cncer de mama y formacin de cogulos en la pierna.  El estrgeno transdrmico (estrgeno que se absorbe a travs  de la piel mediante el uso de un parche o una crema) puede tener mejores resultados en los siguientes casos:  Colesterol.  La presin arterial.  Cogulos sanguneos. La presencia de estas afecciones puede indicar que no debe realizarse terapia hormonal:  Cncer de endometrio.  Enfermedad heptica.  Cncer de mama.  Cardiopata.  Antecedentes de cogulos sanguneos.  Ictus. TRATAMIENTO  Si decide realizar una TH y tiene tero, normalmente se prescribe el uso de estrgeno y progestina.  El profesional que la asiste le ayudar a decidir la mejor forma de tomar los medicamentos.  Entre las formas posibles de administracin de estrgeno, se incluyen las siguientes:  Pldoras.  Parches.  Geles.  Aerosoles.  Crema, anillos y vulos vaginales con estrgeno.  Lo mejor es tomar la menor dosis posible que pueda ayudarla con sus sntomas y tomarlos durante la menor cantidad de tiempo posible.  La terapia hormonal puede ayudar a aliviar algunos de los problemas (sntomas) que afectan a las mujeres durante la menopausia. Antes de tomar una decisin con respecto a la TH, converse con el profesional que la asiste acerca de qu es lo mejor para usted. Mantngase bien informada y sintase cmoda con sus decisiones. INSTRUCCIONES PARA EL CUIDADO DOMICILIARIO:  Siga las indicaciones del profesional con respecto a cmo tomar los medicamentos.  Se realiza una prueba de Papanicolaou para detectar el cncer de cuello del tero.  El primer Papanicolaou debe realizarse a los 21 aos.  La prueba se repite cada 2 aos entre los 21 y los 29 aos.  Despus de los 30 aos, debe realizarse una prueba de Papanicolaou cada 3 aos siempre que los 3 estudios anteriores hayan sido normales.  Algunas mujeres presentan problemas mdicos que aumentan la probabilidad de contraer cncer de cuello del tero. Consulte a su mdico acerca de estos problemas. Es muy importante que le informe a su mdico si  aparecen nuevos problemas poco despus de su ltimo Papanicolaou. En estos casos, el mdico podr indicar que se realice el Papanicolaou con ms frecuencia.  Las recomendaciones anteriores son las mismas para las mujeres que hayan recibido o no la vacuna para el VPH (virus del papiloma humano).  Si le han realizado una histerectoma por un problema que no era cncer o una afeccin que pudiera causar cncer, ya no necesitar un Papanicolaou. Sin embargo, aunque ya no necesite hacerse un Papanicolaou, es una buena idea hacerse un examen regularmente para asegurarse de que no haya otros problemas.  Si tiene entre 65 y 70 aos y ha tenido resultados normales en los estudios de Papanicolaou en los ltimos 10 aos, ya no ser necesario realizarlo. Sin embargo, aunque ya no necesite hacerse un Papanicolaou, es una buena idea hacerse un examen regularmente para asegurarse de que no haya otros problemas.  Si ha recibido un tratamiento para el cncer cervical o una enfermedad que podra causar cncer, necesitar realizarse una prueba de Papanicolaou y controles durante al menos 20 aos de concluido el tratamiento.  Si no se ha hecho los estudios con regularidad, debern volver a evaluarse los factores de riesgo (como el tener un nuevo compaero sexual) para determinar si es necesario que se los haga de nuevo.  Es posible que algunas mujeres deban realizarse exmenes de deteccin con mayor frecuencia   si presentan un alto riesgo de padecer cncer de cuello del tero.  Hgase controles de manera regular, e incluya Papanicolau y mamografas. SOLICITE ATENCIN MDICA DE INMEDIATO SI PRESENTA:  Hemorragia vaginal anormal.  Dolor o inflamacin en las piernas, falta de aliento o dolor en el pecho.  Mareos o dolores de cabeza.  Protuberancias o cambios en sus mamas o axilas.  Pronunciacin inarticulada.  Debilidad o adormecimiento en los brazos o las piernas.  Dolor, ardor o sangrado al orinar.  Dolor  abdominal.   Esta informacin no tiene como fin reemplazar el consejo del mdico. Asegrese de hacerle al mdico cualquier pregunta que tenga.   Document Released: 04/12/2008 Document Revised: 03/11/2015 Elsevier Interactive Patient Education 2016 Elsevier Inc. La menopausia (Menopause) La menopausia es el perodo normal de la vida en el cual los perodos menstruales cesan por completo. Se completa cuando no se tienen 12 perodos menstruales consecutivos. Ocurre generalmente en mujeres entre los 40 y los 55 aos. Muy rara vez una mujer tiene la menopausia antes de los 40 aos. En la menopausia, los ovarios dejan de producir las hormonas femeninas estrgeno y progesterona. Esto puede causar sntomas indeseables y tambin afectar a la salud. A veces los sntomas aparecen entre 4 y 5 aos antes de que comience la menopausia. No existe una relacin entre la menopausia y:  Los anticonceptivos orales.  La cantidad de hijos que tuvo.  La raza.  La edad en que comenzaron los perodos menstruales (menarca). Las mujeres que fuman mucho y las que son muy delgadas pueden desarrollar la menopausia a una edad ms temprana. CAUSAS  Los ovarios dejan de producir las hormonas femeninas estrgeno y progesterona.  Otras causas son:  Ciruga en la que se extirpan ambos ovarios.  Los ovarios que dejan de funcionar sin un motivo conocido.  Tumores de la glndula pituitaria en el cerebro.  Enfermedades que afectan los ovarios y la produccin de hormonas.  Radioterapia en el abdomen o la pelvis.  Quimioterapia que afecte a los ovarios. SNTOMAS   Acaloramiento.  Sudoracin nocturna.  Disminucin del deseo sexual.  Sequedad vaginal y adelgazamiento de la vagina, lo que causa relaciones sexuales dolorosas.  Sequedad de la piel y aparicin de arrugas.  Dolores de cabeza.  Cansancio.  Irritabilidad.  Problemas de memoria.  Aumento de peso.  Infeccin de la vejiga.  Aparicin de vello  en el rostro y el pecho.  Infertilidad. Los sntomas ms graves pueden incluir:  Prdida de masa sea osteoporosis) que favorece la rotura de huesos(fracturas).  Depresin.  Endurecimiento y estrechamiento de las arterias (aterosclerosis) lo que puede causar infarto e ictus. DIAGNSTICO   El perodo menstrual no aparece por 12 meses seguidos.  Examen fsico.  Estudios hormonales de la sangre. TRATAMIENTO  Hay muchas opciones de tratamiento y casi tantas dudas como opciones existen. La decisin de tratar o no los cambios que trae la menopausia es una decisin que realiza el profesional de acuerdo con cada persona en particular. El profesional comentar las opciones de tratamiento con usted. Juntos podrn decidir qu tratamiento es el mejor para usted. Las opciones de tratamiento pueden incluir:   Terapia hormonal (estrgenos y progesterona).  Medicamentos no hormonales.  Tratamiento de los sntomas individuales con medicamentos (por ejemplo antidepresivos para la depresin).  Algunos medicamentos herbales pueden ayudar en sntomas especficos.  Psicoterapia con un psiclogo o psiquiatra.  Terapia grupal.  Cambios en el estilo de vida, como:  Consumir una dieta saludable.  Actividad fsica regular.  Limitar el consumo   de cafena y alcohol.  Control del estrs y meditacin.  No realizar tratamiento. INSTRUCCIONES PARA EL CUIDADO EN EL HOGAR   Tome todos los medicamentos como el mdico le indique.  Descanse y duerma lo suficiente.  Haga ejercicios regularmente.  Consuma una dieta que contenga calcio (bueno para los huesos) y productos derivados de la soja (actan como estrgenos).  Evite las bebidas alcohlicas.  No fume.  Si tiene sofocones, vstase en capas.  Tome suplementos de calcio y vitamina D para fortalecer los huesos.  Puede utilizar lubricantes y humectantes de venta libre para la sequedad vaginal.  En algunos casos la terapia de grupo podr  ayudarla.  La acupuntura puede ser de ayuda en ciertos casos. SOLICITE ATENCIN MDICA SI:   No est segura de estar en la menopausia.  Tiene sntomas de menopausia y necesita asesoramiento y tratamiento.  Tiene 55 aos y an tiene perodos menstruales.  Tiene dolor durante las relaciones sexuales.  La menopausia se ha completado (no ha tenido el perodo menstrual durante 12 meses) y tiene un sangrado vaginal.  Necesita ser derivada a un especialista (gineclogo, psiquiatra, o psiclogo) para realizar un tratamiento. SOLICITE ATENCIN MDICA DE INMEDIATO SI:   Siente una depresin intensa e incontrolable.  Tiene una hemorragia vaginal abundante.  Siente y cree que se ha fracturado un hueso.  Siente dolor al orinar.  Siente dolor en el pecho o en la pierna.  Tiene latidos cardacos rpidos (palpitaciones).  Siente dolor de cabeza intenso.  Tiene problemas de visin.  Siente un bulto en la mama.  Siente un dolor abdominal intenso o indigestin.   Esta informacin no tiene como fin reemplazar el consejo del mdico. Asegrese de hacerle al mdico cualquier pregunta que tenga.   Document Released: 12/06/2006 Document Revised: 06/27/2013 Elsevier Interactive Patient Education 2016 Elsevier Inc.  

## 2015-11-27 ENCOUNTER — Encounter: Payer: Self-pay | Admitting: Gynecology

## 2015-11-27 ENCOUNTER — Ambulatory Visit (INDEPENDENT_AMBULATORY_CARE_PROVIDER_SITE_OTHER): Payer: BLUE CROSS/BLUE SHIELD | Admitting: Gynecology

## 2015-11-27 VITALS — BP 134/86

## 2015-11-27 DIAGNOSIS — Z09 Encounter for follow-up examination after completed treatment for conditions other than malignant neoplasm: Secondary | ICD-10-CM

## 2015-11-27 DIAGNOSIS — Z79890 Hormone replacement therapy (postmenopausal): Secondary | ICD-10-CM

## 2015-11-27 MED ORDER — EST ESTROGENS-METHYLTEST 0.625-1.25 MG PO TABS: 1.0000 | ORAL_TABLET | Freq: Every day | ORAL | Status: DC

## 2015-11-27 NOTE — Progress Notes (Signed)
   Patient is a 50 year old who presented to the office for six-week visit. On 10/20/2014 patient underwent laparoscopic-assisted vaginal hysterectomy with left salpingo-oophorectomy and cystoscopy as a result of her this menorrhea, menorrhagia, fibroid uterus and anemia. In [place] many years ago she had had a right salpingo-oophorectomy. Patient's doing well she was asymptomatic today with exception of vasomotor symptoms. Patient had been prescribed Estratest on last office visit and her pharmacist for whatever reason did not administer the medication. At that last office visit the findings from surgery as well as pictures and pathology report was discussed with her as follows:  FINDINGS: Mild left pelvic sidewall adhesions. Left tube and ovary present.: Clip was noted proximal portion of left fallopian tube. Absence of right tube and ovary. No endometriosis in the anterior or posterior cul-de-sac. Smooth liver surface and gallbladder visualized. Appendix not seen. Normal-appearing uterus.  Pathology report: Diagnosis Uterus and cervix, with left fallopian tube and ovary - CERVIX: UNREMARKABLE. - ENDOMETRIUM: INACTIVE APPEARING ENDOMETRIUM. - MYOMETRIUM: ADENOMYOSIS. - SEROSA: ADHESIONS. - LEFT ADNEXA: BENIGN OVARY AND FALLOPIAN TUBE.  Exam: Abdomen: Soft nontender no rebound or guarding incision ports healed Pelvic: Bartholin urethra Skene was within normal limits Vagina: No lesions or discharge Vaginal cuff intact suture material removed and friability was noted and silver nitrate was used for hemostasis. Bimanual exam no palpable mass or tenderness Rectal exam not done  Assessment/plan: Patient 6 weeks status post laparoscopic-assisted vaginal hysterectomy with left salpingo-oophorectomy as a result of dysmenorrhea, menorrhagia/adenomyosis and anemia doing well. Her hemoglobin at the last office visit was back to normal with a value of 13.3 and a platelet count of 180,000. Patient will be  prescribed Cleocin vaginal cream to apply intravaginally daily at bedtime for one week. I've extended her risk for one more week since she has to do a lot of strenuous lifting and activity at work. She will refrain from intercourse for 2 weeks. She'll return back to the office at the end of the year for her annual exam or when necessary.

## 2016-02-02 ENCOUNTER — Telehealth: Payer: Self-pay | Admitting: *Deleted

## 2016-02-02 NOTE — Telephone Encounter (Signed)
Dr.Ferandez please see the below note.

## 2016-02-02 NOTE — Telephone Encounter (Signed)
-----   Message from Sinclair Grooms sent at 02/02/2016  3:13 PM EDT ----- Regarding: RX JF prescribe  estrogen-methylTESTOSTERone 0.625-1.25 MG tablet in January but she can not afford it so she has not filled it yet. She wants to know if JF can give her something similar but not as expensive. Thx

## 2016-02-03 ENCOUNTER — Other Ambulatory Visit: Payer: Self-pay | Admitting: Gynecology

## 2016-02-03 MED ORDER — ESTRADIOL 1 MG PO TABS: 1.0000 mg | ORAL_TABLET | Freq: Every day | ORAL | Status: DC

## 2016-02-03 NOTE — Telephone Encounter (Signed)
Rx sent. Note sent to [facility] to let patient know.

## 2016-02-03 NOTE — Telephone Encounter (Signed)
Call in prescription for Estrace 1 mg PO Q Daily #30 with 11 refills

## 2016-06-18 ENCOUNTER — Encounter: Payer: Self-pay | Admitting: Gynecology

## 2016-06-18 ENCOUNTER — Ambulatory Visit (INDEPENDENT_AMBULATORY_CARE_PROVIDER_SITE_OTHER): Payer: BLUE CROSS/BLUE SHIELD | Admitting: Gynecology

## 2016-06-18 VITALS — BP 124/80 | Wt 179.0 lb

## 2016-06-18 DIAGNOSIS — Z01419 Encounter for gynecological examination (general) (routine) without abnormal findings: Secondary | ICD-10-CM

## 2016-06-18 DIAGNOSIS — Z79890 Hormone replacement therapy (postmenopausal): Secondary | ICD-10-CM

## 2016-06-18 DIAGNOSIS — Z780 Asymptomatic menopausal state: Secondary | ICD-10-CM

## 2016-06-18 MED ORDER — EST ESTROGENS-METHYLTEST 0.625-1.25 MG PO TABS: 1.0000 | ORAL_TABLET | Freq: Every day | ORAL | 5 refills | Status: DC

## 2016-06-18 NOTE — Patient Instructions (Signed)
Colonoscopa (Colonoscopy) Ardelia Mems colonoscopa es un examen que se realiza para examinar todo el intestino grueso (colon). Este examen puede ayudar a Hydrographic surveyor problemas, como tumores, plipos, inflamacin y reas de Social research officer, government. El examen dura aproximadamente 1hora.  INFORME A SU MDICO:   Cualquier alergia que tenga.  Todos los [employer], incluidos vitaminas, hierbas, gotas oftlmicas, cremas y medicamentos de venta libre.  Problemas previos que usted o los [employer] de su familia hayan tenido con el uso de anestsicos.  Enfermedades de [employer].  Cirugas previas.  Enfermedades patolgicas. RIESGOS Y COMPLICACIONES  En general, se trata de un procedimiento seguro. Sin embargo, Games developer procedimiento, pueden surgir complicaciones. Las complicaciones posibles son:  Hemorragias.  Desgarro o ruptura de la pared del colon.  Reaccin a los Stage manager.  Infeccin (raro). ANTES DEL PROCEDIMIENTO   Consulte a su mdico si debe cambiar o suspender los medicamentos que toma habitualmente.  Posiblemente se le recete una preparacin del colon por va oral. Esto incluye beber una gran cantidad de lquido medicinal desde el da anterior a su procedimiento. El lquido har que elimine muchas heces blandas hasta que sean casi claras o de color verdoso claro. De esta manera limpiar el colon para prepararlo para el procedimiento.  No coma ni beba nada ms una vez que haya comenzado con la preparacin del colon, a menos que el mdico le indique que es seguro [place].  Pdale a alguna persona que la lleve a su casa luego del procedimiento. PROCEDIMIENTO   Le administrarn un medicamento para que pueda relajarse (sedante).  Se recostar de costado con las rodillas flexionadas.  Se insertar un tubo largo y flexible con Ardelia Mems luz y Ardelia Mems cmara en el extremo (colonoscopio) a travs del recto y dentro del colon. La cmara enviar el video hacia  una pantalla de computadora a medida que se vaya moviendo por el colon. El colonoscopio tambin libera dixido de carbono para inflar el colon. Esto ayuda a que el mdico pueda ver mejor el rea.  Durante el examen, es posible que su mdico tome una pequea muestra de tejido (biopsia) para examinarla bajo el microscopio, si se encuentran anormalidades.  El examen finaliza cuando se ha examinado todo el colon. DESPUS DEL PROCEDIMIENTO   No conduzca vehculos durante las 24horas posteriores al examen.  Es posible que encuentre una pequea cantidad de sangre en la materia fecal.  Ileene Hutchinson tenga cantidades moderadas de gases y calambres o hinchazn abdominales leves. Esto se produce a causa del gas utilizado para inflar el colon durante el examen.  Pregunte cundo estarn [employer] del examen y cmo los obtendr. Asegrese de [place].   Esta informacin no tiene Marine scientist el consejo del mdico. Asegrese de hacerle al mdico cualquier pregunta que tenga.   Document Released: 08/04/2005 Document Revised: 08/15/2013 Elsevier Interactive Patient Education [employer].

## 2016-06-18 NOTE — Progress Notes (Signed)
Stephanie Gibson Dec 14, 1965 [id number]   History:    50 y.o.  for annual gyn exam who last year had a laparoscopic-assisted vaginal hysterectomy with left salpingo-oophorectomy as a result of menorrhagia fibroid uterus and anemia. In [place] several years ago she had had a right salpingo-oophorectomy. A she has been on the Estratest 0.625 daily as a form of hormone replacement therapy and has done well. She still has not had a colonoscopy as of yet. We discussed importance of calcium vitamin D for osteoporosis prevention. Next year she will need a bone density study. Patient prior to her hysterectomy has never had any history of any abnormal Pap smears.  Past medical history,surgical history, family history and social history were all reviewed and documented in the EPIC chart.  Gynecologic History Patient's last menstrual period was 08/10/2015 (approximate). Contraception: status post hysterectomy Last Pap: 2015. Results were: normal Last mammogram: 2016. Results were: normal  Obstetric History OB History  Gravida Para Term Preterm AB Living  4 2     2 2   SAB TAB Ectopic Multiple Live Births  1   1        # Outcome Date GA Lbr Len/2nd Weight Sex Delivery Anes PTL Lv  4 Ectopic           3 Para           2 Para           1 SAB                ROS: A ROS was performed and pertinent positives and negatives are included in the history.  GENERAL: No fevers or chills. HEENT: No change in vision, no earache, sore throat or sinus congestion. NECK: No pain or stiffness. CARDIOVASCULAR: No chest pain or pressure. No palpitations. PULMONARY: No shortness of breath, cough or wheeze. GASTROINTESTINAL: No abdominal pain, nausea, vomiting or diarrhea, melena or bright red blood per rectum. GENITOURINARY: No urinary frequency, urgency, hesitancy or dysuria. MUSCULOSKELETAL: No joint or muscle pain, no back pain, no recent trauma. DERMATOLOGIC: No rash, no itching, no lesions. ENDOCRINE: No polyuria,  polydipsia, no heat or cold intolerance. No recent change in weight. HEMATOLOGICAL: No anemia or easy bruising or bleeding. NEUROLOGIC: No headache, seizures, numbness, tingling or weakness. PSYCHIATRIC: No depression, no loss of interest in normal activity or change in sleep pattern.     Exam: chaperone present  BP 124/80   Wt 179 lb (81.2 kg)   LMP 08/10/2015 (Approximate)   BMI 31.71 kg/m   Body mass index is 31.71 kg/m.  General appearance : Well developed well nourished female. No acute distress HEENT: Eyes: no retinal hemorrhage or exudates,  Neck supple, trachea midline, no carotid bruits, no thyroidmegaly Lungs: Clear to auscultation, no rhonchi or wheezes, or rib retractions  Heart: Regular rate and rhythm, no murmurs or gallops Breast:Examined in sitting and supine position were symmetrical in appearance, no palpable masses or tenderness,  no skin retraction, no nipple inversion, no nipple discharge, no skin discoloration, no axillary or supraclavicular lymphadenopathy Abdomen: no palpable masses or tenderness, no rebound or guarding Extremities: no edema or skin discoloration or tenderness  Pelvic:  Bartholin, Urethra, Skene Glands: Within normal limits             Vagina: No gross lesions or discharge  Cervix: Absent  Uterus  absent  Adnexa  Without masses or tenderness  Anus and perineum  normal   Rectovaginal  normal sphincter tone without palpated  masses or tenderness             Hemoccult colonoscopy this year     Assessment/Plan:  50 y.o. female for annual exam was reminded to schedule her colonoscopy this year. Patient she will have a bone density study. Pap smear no longer indicated. Patient will return to the office next week in a fasting state for the following screening blood work: Fasting lipid profile, comprehensive metabolic panel, TSH, CBC, and urinalysis. We discussed importance of calcium vitamin D and weightbearing exercises for osteoporosis prevention.  We discussed importance of monthly breast exams.   Terrance Mass MD, 3:32 PM 06/18/2016

## 2016-06-19 LAB — URINALYSIS W MICROSCOPIC + REFLEX CULTURE
Bacteria, UA: NONE SEEN [HPF]
Bilirubin Urine: NEGATIVE
CASTS: NONE SEEN [LPF]
CRYSTALS: NONE SEEN [HPF]
Glucose, UA: NEGATIVE
HGB URINE DIPSTICK: NEGATIVE
Leukocytes, UA: NEGATIVE
NITRITE: NEGATIVE
PH: 6 (ref 5.0–8.0)
Protein, ur: NEGATIVE
RBC / HPF: NONE SEEN RBC/HPF (ref <=2)
SPECIFIC GRAVITY, URINE: 1.026 (ref 1.001–1.035)
YEAST: NONE SEEN [HPF]

## 2016-06-20 LAB — URINE CULTURE: ORGANISM ID, BACTERIA: NO GROWTH

## 2016-06-24 ENCOUNTER — Telehealth: Payer: Self-pay | Admitting: *Deleted

## 2016-06-24 MED ORDER — ESTRADIOL 1 MG PO TABS: 1.0000 mg | ORAL_TABLET | Freq: Every day | ORAL | 4 refills | Status: DC

## 2016-06-24 NOTE — Telephone Encounter (Signed)
Will have Maynard relay and found out which pharmacy she would like Rx sent to.

## 2016-06-24 NOTE — Telephone Encounter (Signed)
Pt was prescribed estratest 0.625 daily at office visit 06/18/16 medication is no longer a covered drug under her insurance plan, can pt have other options? Please advise

## 2016-06-24 NOTE — Telephone Encounter (Signed)
Estrace 1 mg PO qD  #30 refills x 11

## 2016-06-24 NOTE — Telephone Encounter (Signed)
Rx  sent  to  [facility]

## 2016-06-25 ENCOUNTER — Other Ambulatory Visit: Payer: BLUE CROSS/BLUE SHIELD

## 2016-06-25 LAB — COMPREHENSIVE METABOLIC PANEL
ALBUMIN: 3.8 g/dL (ref 3.6–5.1)
ALT: 21 U/L (ref 6–29)
AST: 18 U/L (ref 10–35)
Alkaline Phosphatase: 113 U/L (ref 33–130)
BILIRUBIN TOTAL: 0.3 mg/dL (ref 0.2–1.2)
BUN: 11 mg/dL (ref 7–25)
CALCIUM: 9 mg/dL (ref 8.6–10.4)
CO2: 26 mmol/L (ref 20–31)
CREATININE: 0.64 mg/dL (ref 0.50–1.05)
Chloride: 106 mmol/L (ref 98–110)
Glucose, Bld: 100 mg/dL — ABNORMAL HIGH (ref 65–99)
POTASSIUM: 4 mmol/L (ref 3.5–5.3)
Sodium: 140 mmol/L (ref 135–146)
Total Protein: 6.8 g/dL (ref 6.1–8.1)

## 2016-06-25 LAB — CBC WITH DIFFERENTIAL/PLATELET
BASOS ABS: 58 {cells}/uL (ref 0–200)
Basophils Relative: 1 %
EOS ABS: 58 {cells}/uL (ref 15–500)
Eosinophils Relative: 1 %
HEMATOCRIT: 40.2 % (ref 35.0–45.0)
HEMOGLOBIN: 13.7 g/dL (ref 11.7–15.5)
LYMPHS ABS: 1856 {cells}/uL (ref 850–3900)
Lymphocytes Relative: 32 %
MCH: 27.7 pg (ref 27.0–33.0)
MCHC: 34.1 g/dL (ref 32.0–36.0)
MCV: 81.4 fL (ref 80.0–100.0)
MONO ABS: 464 {cells}/uL (ref 200–950)
MPV: 11.7 fL (ref 7.5–12.5)
Monocytes Relative: 8 %
NEUTROS PCT: 58 %
Neutro Abs: 3364 cells/uL (ref 1500–7800)
Platelets: 179 10*3/uL (ref 140–400)
RBC: 4.94 MIL/uL (ref 3.80–5.10)
RDW: 16 % — ABNORMAL HIGH (ref 11.0–15.0)
WBC: 5.8 10*3/uL (ref 3.8–10.8)

## 2016-06-25 LAB — LIPID PANEL
CHOLESTEROL: 182 mg/dL (ref 125–200)
HDL: 47 mg/dL (ref >=46)
LDL Cholesterol: 119 mg/dL (ref <130)
TRIGLYCERIDES: 82 mg/dL (ref <150)
Total CHOL/HDL Ratio: 3.9 Ratio (ref <=5.0)
VLDL: 16 mg/dL (ref <30)

## 2016-06-25 LAB — TSH: TSH: 1.52 mIU/L

## 2016-08-13 ENCOUNTER — Other Ambulatory Visit: Payer: Self-pay | Admitting: Gynecology

## 2016-08-13 DIAGNOSIS — Z1231 Encounter for screening mammogram for malignant neoplasm of breast: Secondary | ICD-10-CM

## 2016-08-20 ENCOUNTER — Ambulatory Visit
Admission: RE | Admit: 2016-08-20 | Discharge: 2016-08-20 | Disposition: A | Discharge status: Home / Self Care | Payer: BLUE CROSS/BLUE SHIELD | Source: Ambulatory Visit | Attending: Gynecology | Admitting: Gynecology

## 2016-08-20 DIAGNOSIS — Z1231 Encounter for screening mammogram for malignant neoplasm of breast: Secondary | ICD-10-CM

## 2017-03-23 ENCOUNTER — Encounter: Payer: Self-pay | Admitting: Gynecology

## 2017-05-28 ENCOUNTER — Other Ambulatory Visit: Payer: Self-pay

## 2017-05-28 ENCOUNTER — Ambulatory Visit (INDEPENDENT_AMBULATORY_CARE_PROVIDER_SITE_OTHER): Payer: BLUE CROSS/BLUE SHIELD | Admitting: Family Medicine

## 2017-05-28 ENCOUNTER — Encounter: Payer: Self-pay | Admitting: Family Medicine

## 2017-05-28 VITALS — BP 135/80 | HR 64 | Temp 98.2°F | Resp 16 | Ht 63.0 in | Wt 177.6 lb

## 2017-05-28 DIAGNOSIS — J45909 Unspecified asthma, uncomplicated: Secondary | ICD-10-CM

## 2017-05-28 DIAGNOSIS — N951 Menopausal and female climacteric states: Secondary | ICD-10-CM | POA: Diagnosis not present

## 2017-05-28 DIAGNOSIS — I10 Essential (primary) hypertension: Secondary | ICD-10-CM

## 2017-05-28 DIAGNOSIS — J4520 Mild intermittent asthma, uncomplicated: Secondary | ICD-10-CM | POA: Insufficient documentation

## 2017-05-28 DIAGNOSIS — J4540 Moderate persistent asthma, uncomplicated: Secondary | ICD-10-CM | POA: Insufficient documentation

## 2017-05-28 DIAGNOSIS — H6123 Impacted cerumen, bilateral: Secondary | ICD-10-CM

## 2017-05-28 MED ORDER — ALBUTEROL SULFATE HFA 108 (90 BASE) MCG/ACT IN AERS: 2.0000 | INHALATION_SPRAY | RESPIRATORY_TRACT | 2 refills | Status: DC | PRN

## 2017-05-28 MED ORDER — ESTRADIOL 1 MG PO TABS: 1.0000 mg | ORAL_TABLET | Freq: Every day | ORAL | 3 refills | Status: DC

## 2017-05-28 MED ORDER — AMLODIPINE BESYLATE 5 MG PO TABS: 5.0000 mg | ORAL_TABLET | Freq: Every day | ORAL | 3 refills | Status: DC

## 2017-05-28 NOTE — Patient Instructions (Addendum)
Continue to use the inhaler only when you have flareups of wheezing and asthma.  Continue taking the blood pressure medicine every day, amlodipine  Continue taking the estrogen hormone replacement, estradiol, one daily.  I would recommend you to try to clean out you ears about every 3 months very well using over-the-counter Debrox. If you do this regularly on a periodic basis, you will avoid it getting plugged up badly like it is today.  Return as needed.  Advise seeing your gynecologist back next year for refilling the hormones again.    IF you received an x-ray today, you will receive an invoice from [facility]. Please contact [facility] at [phone] with questions or concerns regarding your invoice.   IF you received labwork today, you will receive an invoice from [facility]. Please contact [facility] at [phone] with questions or concerns regarding your invoice.   Our billing staff will not be able to assist you with questions regarding bills from these companies.  You will be contacted with the lab results as soon as they are available. The fastest way to get your results is to activate your My Chart account. Instructions are located on the last page of this paperwork. If you have not heard from [place] regarding the results in 2 weeks, please contact this office.

## 2017-05-28 NOTE — Progress Notes (Signed)
Patient ID: Stephanie Gibson, female    DOB: 12-07-1965  Age: 51 y.o. MRN: [id number]  Chief Complaint  Patient presents with  . Medication Refill    norvasc, estrogen, and albuterol    Subjective:   51 year old lady has wax in her ears and check him, is complaining of the right. She has tried to use some earwax removal kit at home unsuccessfully. She is currently by her husband. She has been in [place] for 8 years but does not speak [place]. He interprets. She rarely uses her denies any other related problems  Current allergies, medications, problem list, past/family and social histories reviewed.  Objective:  BP 135/80   Pulse 64   Temp 98.2 F (36.8 C) (Oral)   Resp 16   Ht _0  (1.6 m)   Wt 177 lb 9.6 oz (80.6 kg)   LMP 08/10/2015 (Approximate) Comment: continuous bleeding/spotting for months  SpO2 95%   BMI 31.46 kg/m   Pleasant lady, healthy-appearing. Her TMs are both occluded by lots of dry wax. Her throat was clear. Neck supple without nodes or thyromegaly. Chest is clear without wheezing. Heart regular without murmur.  Assessment & Plan:   Assessment: 1. Menopausal symptom   2. Essential hypertension   3. Mild asthma without complication, unspecified whether persistent   4. Bilateral impacted cerumen       Plan: There were 2 medicines listed on her chart, and estradiol and 1 with testosterone combined. They called home and the son took a picture of the bottle. She is apparently not taking more testosterone now, just taking the estrogen alone.  Will irrigate out ears  No orders of the defined types were placed in this encounter.   No orders of the defined types were placed in this encounter.        Patient Instructions       IF you received an x-ray today, you will receive an invoice from [facility]. Please contact [facility] at [phone] with questions or concerns regarding your invoice.   IF you received labwork  today, you will receive an invoice from [facility]. Please contact [facility] at [phone] with questions or concerns regarding your invoice.   Our billing staff will not be able to assist you with questions regarding bills from these companies.  You will be contacted with the lab results as soon as they are available. The fastest way to get your results is to activate your My Chart account. Instructions are located on the last page of this paperwork. If you have not heard from [place] regarding the results in 2 weeks, please contact this office.         No Follow-up on file.   Ladarren Steiner, MD 05/28/2017

## 2017-09-05 ENCOUNTER — Ambulatory Visit (INDEPENDENT_AMBULATORY_CARE_PROVIDER_SITE_OTHER): Payer: BLUE CROSS/BLUE SHIELD | Admitting: Obstetrics & Gynecology

## 2017-09-05 ENCOUNTER — Encounter: Payer: Self-pay | Admitting: Obstetrics & Gynecology

## 2017-09-05 VITALS — BP 160/88 | Ht 62.75 in | Wt 182.0 lb

## 2017-09-05 DIAGNOSIS — Z90710 Acquired absence of both cervix and uterus: Secondary | ICD-10-CM | POA: Diagnosis not present

## 2017-09-05 DIAGNOSIS — Z79890 Hormone replacement therapy (postmenopausal): Secondary | ICD-10-CM

## 2017-09-05 DIAGNOSIS — Z6832 Body mass index (BMI) 32.0-32.9, adult: Secondary | ICD-10-CM | POA: Diagnosis not present

## 2017-09-05 DIAGNOSIS — E8940 Asymptomatic postprocedural ovarian failure: Secondary | ICD-10-CM

## 2017-09-05 DIAGNOSIS — E6609 Other obesity due to excess calories: Secondary | ICD-10-CM

## 2017-09-05 DIAGNOSIS — Z01411 Encounter for gynecological examination (general) (routine) with abnormal findings: Secondary | ICD-10-CM

## 2017-09-05 MED ORDER — EST ESTROGENS-METHYLTEST 0.625-1.25 MG PO TABS: 1.0000 | ORAL_TABLET | Freq: Every day | ORAL | 4 refills | Status: DC

## 2017-09-05 NOTE — Patient Instructions (Signed)
1. Encounter for gynecological examination with abnormal finding Gyn exam s/p LAVH/BSO.  Pap reflex done.  Breasts wnl.  Will schedule screening Mammo.  2. Hx of total hysterectomy  3. Surgical menopause on hormone replacement therapy Well on Estrogen-MethylTestosterone, represcribed.  Vitamin D supplements.  Calcium and food.  Weightbearing physical activity. - DG Bone Density; Future  4. Class 1 obesity due to excess calories without serious comorbidity with body mass index (BMI) of 32.0 to 32.9 in adult Low carb low, calorie diet discussed.  Encouraged to increase her physical activity.  Shiana, fue un placer encontrarla hoy!  Voy a informarla de Financial trader.   Hacer ejercicio para Pharmacist, hospital (Exercising to [employer]) Hacer ejercicio puede ayudarlo a bajar de peso. Para bajar de Universal Health ejercicio, este debe ser de intensidad vigorosa. Puede saber que est haciendo ejercicio de intensidad vigorosa si respira con mucha dificultad y rapidez, y no puede mantener una conversacin. El ejercicio de intensidad moderada ayuda a Contractor peso actual. Puede saber que est haciendo ejercicio de intensidad moderada si tiene una frecuencia cardaca ms elevada y [place] respiracin ms rpida, pero an puede Programmer, systems. [place]? Elija una actividad que disfrute y establezca objetivos realistas. El mdico puede ayudarlo a Paediatric nurse un plan de actividades que funcione para usted. Haga ejercicio regularmente como se lo haya indicado el mdico. Esta puede incluir:  Neurosurgeon de [place] veces por semana, como: ? Flexiones de Merrill Lynch. ? Abdominales. ? Levantamiento de pesas. ? Ejercicios con bandas elsticas.  Realizar una intensidad determinada de ejercicio durante una cantidad determinada de [place]. Elija entre estas opciones: ? 1109minutos de ejercicio de intensidad moderada cada semana. ? 13minutos de ejercicio  de intensidad vigorosa cada semana. ? [employer] de ejercicio de intensidad moderada y vigorosa cada semana. Los nios, las mujeres [place], las personas que no estn en forma, las personas con sobrepeso y los adultos mayores tal vez tengan que consultar a un mdico para que les d Glass blower/designer. Si tiene [employer], asegrese de Teacher, adult education al mdico antes de comenzar un programa de ejercicios nuevo. [place]?  Caminar a un ritmo de al menos 4,36millas (7kilmetros) por hora.  Trotar o correr a un ritmo de 5 millas (8 kilmetros) por hora.  Andar en bicicleta a un ritmo de al menos 10 millas (16 kilmetros) por hora.  Practicar natacin.  Practicar patinaje sobre ruedas normales o en lnea.  Hacer esqu de fondo.  Hacer deportes competitivos vigorosos, como ftbol americano, bsquet y ftbol.  Saltar la soga.  Tomar clases de baile aerbico. CMO PUEDO SER MS ACTIVO EN MIS ACTIVIDADES DIARIAS?  Utilice las Clinical cytogeneticist del ascensor.  D una caminata durante su hora de almuerzo.  Si conduce, estacione el automvil ms lejos del trabajo o de la escuela.  Si [place] transporte pblico, bjese una parada antes y camine el resto del camino.  Pngase de pie y camine cada vez que haga llamadas telefnicas.  Levntese, estrese y camine cada 22minutos a lo largo del Training and development officer. [place]?  No haga ejercicio en exceso que pudiera hacer que se lastime, se sienta mareado o tenga dificultad para respirar.  Consulte al mdico antes de comenzar un programa de ejercicios nuevo.  Use ropa cmoda y calzado con buen soporte.  Beba gran cantidad de agua mientras hace ejercicios para evitar la deshidratacin  o los golpes de Freight forwarder. Durante la actividad fsica se pierde agua corporal que se debe reponer.  Haga ejercicio hasta que se acelere su respiracin y sus latidos  cardacos. Esta informacin no tiene Marine scientist el consejo del mdico. Asegrese de hacerle al mdico cualquier pregunta que tenga. Document Released: 01/29/2011 Document Revised: 11/15/2014 Document Reviewed: 03/28/2014 Elsevier Interactive Patient Education  2018 [place] de caloras para bajar de peso (Calorie Counting for [employer] Mutual Life Loss) Las caloras son energa que se obtiene de lo que se come y se bebe. El organismo [place] esta energa para mantenerlo Curator. La cantidad de caloras que come tiene incidencia Lub[employer]ol Corporation. Cuando come ms caloras de las que el cuerpo necesita, este acumula las caloras extra [place]. Cuando come Universal Health de las que el cuerpo [place], este quema grasa para obtener la energa que requiere. El recuento de caloras es el registro de la cantidad de caloras que come y Pharmacologist. Si se asegura de comer menos caloras de las que el cuerpo necesita, debe bajar de [place]. Para que el recuento de caloras funcione, tendr que comer la cantidad de caloras adecuadas para usted en un da, para bajar una cantidad de peso saludable por semana. Una cantidad de peso saludable para bajar por semana suele ser [place] 1 y Ivar Drape (0,5 a 0,9kg). Un nutricionista puede determinar la cantidad de caloras que necesita por da y sugerirle cmo alcanzar su objetivo calrico. CUL ES MI PLAN? Mi objetivo es comer __________ Raenette Rover da. Si como esta cantidad de caloras por da, debo bajar unas __________ Terrall Laity. QU DEBO SABER ACERCA DEL [place]? A fin de alcanzar su objetivo diario de caloras, tendr que:  Averiguar cuntas caloras hay en cada alimento que le Therapist, occupational. Intente hacerlo antes de comer.  Decida la cantidad que puede comer del alimento.  Anote lo que comi y cuntas caloras tena. Esta tarea se conoce como llevar un registro de comidas. DNDE ENCUENTRO INFORMACIN SOBRE LAS  CALORAS? Es posible Animator cantidad de caloras que contiene un alimento en la etiqueta de informacin nutricional. Tenga en cuenta que toda la informacin que se incluye en una etiqueta se basa en una porcin especfica del alimento. Si un alimento no tiene una etiqueta de informacin nutricional, intente buscar las caloras en Internet o pida ayuda al nutricionista. CMO DECIDO CUNTO COMER? Para decidir qu cantidad del alimento puede comer, tendr que tener en cuenta el nmero de caloras en una porcin y el tamao de una porcin. Es posible Financial trader en la etiqueta de informacin nutricional. Si un alimento no tiene una etiqueta de informacin nutricional, busque los [place] en Internet o pida ayuda al nutricionista. Recuerde que las caloras se calculan por porcin. Si opta por comer ms de una porcin de un alimento, tendr Tenneco Inc las caloras por porcin por la cantidad de porciones que planea comer. Por ejemplo, la etiqueta de un envase de pan puede decir que el tamao de una porcin es [place], y que una porcin tiene 90caloras. Si come 1rodaja, habr comido 90caloras. Si come 2rodajas, habr comido 180caloras. [place]? Despus de cada comida, registre la siguiente informacin en el registro de comidas:  Lo que comi.  La cantidad que comi.  La cantidad de caloras que tena.  Luego, sume las caloras. Tenga a Materials engineer de comidas, por ejemplo, en un anotador de bolsillo. Otra alternativa es  usar Garment/textile technologist en el telfono mvil o un sitio web. Algunos programas calcularn las caloras y [employer] Stores la cantidad de caloras que le [place], [place] vez que agregue un alimento al Control and instrumentation engineer. CULES SON ALGUNOS CONSEJOS PARA EL [place]?  Use las caloras de los alimentos y las bebidas que lo sacien y no lo dejen con apetito, por ejemplo, frutos secos y Engineer, mining de frutos secos, verduras, protenas magras y  alimentos con alto contenido de fibra (ms de 5g de fibra por porcin).  Coma alimentos nutritivos y evite las caloras vacas. Las caloras vacas son aquellas que se obtienen de los alimentos o las bebidas que no contienen muchos nutrientes, como los dulces y los refrescos. Es mejor comer una comida nutritiva altamente calrica (como un aguacate) que una con pocos nutrientes (como una bolsa de patatas fritas).  Sepa cuntas caloras tienen los alimentos que come con ms frecuencia. eBay, no tiene que buscar este dato cada vez que los come.  Est atento a los Chiropodist hipocalricos, pero que en realidad contienen muchas caloras, como los productos de [place], los refrescos y los dulces sin Lobbyist.  Preste atencin a las Automatic Data, [employer] Resources refrescos, las bebidas a base de [place], las bebidas con alcohol y los jugos, que contienen muchas caloras, pero no le dan saciedad. Opte por las bebidas bajas en caloras, como el agua y las bebidas dietticas.  Concntrese en tratar de contar las caloras de los alimentos que tienen la mayor cantidad de caloras. Registrar las caloras de una ensalada que solo contiene hortalizas es menos importante que calcular las de un batido de [place].  Encuentre un mtodo para controlar las caloras que funcione para usted. Sea creativo. La State Farm de las personas que alcanzan el xito encuentran mtodos para llevar un registro de cunto comen en un da, incluso si no cuentan cada calora.  CULES SON ALGUNOS CONSEJOS PARA CONTROLAR LAS PORCIONES?  Sepa cuntas caloras hay en una porcin. Esto lo ayudar a saber cuntas porciones de un alimento determinado puede comer.  Use una taza medidora para medir los Northrop Grumman, lo que es muy til al principio. Con el tiempo, podr hacer un clculo estimativo de los tamaos de las porciones de algunos alimentos.  Dedique tiempo a poner porciones de diferentes alimentos en  sus platos, tazones y tazas predilectos, a fin de saber cmo se ve una porcin.  Intente no comer directamente de [place] bolsa o una caja, ya que esto puede llevarlo a comer en exceso. Ponga la cantidad Land [employer]Lakes gustara comer en una taza o un plato, a fin de asegurarse de que est comiendo la porcin correcta.  Use platos, vasos y tazones ms pequeos para no comer en exceso. Esta es una forma rpida y sencilla de poner en prctica el control de las porciones. Si el plato es ms pequeo, le caben menos alimentos.  Intente no hacer muchas tareas mientras come, como ver televisin o usar la computadora. Si es la hora de comer, sintese a Conservation officer, nature y disfrute de Environmental education officer. Esto lo ayudar a que empiece a Marine scientist cundo est satisfecho. Tambin le permitir estar ms consciente de lo que come y cunto est comiendo.  [place]?  Pida porciones ms pequeas o porciones para nios.  Considere la posibilidad de Publishing rights manager un plato principal y las guarniciones, en lugar de pedir su propio plato principal.  Si  pide su propio plato principal, coma solo la mitad. Pida una caja al comienzo de la comida y ponga all el resto del plato principal, para no sentir la tentacin de comerlo.  Busque las caloras en el men. Si se detallan las caloras, elija las opciones que contengan la menor cantidad.  Elija platos que incluyan verduras, frutas, cereales integrales, productos lcteos con bajo contenido de grasa y Advertising account planner. Centrarse en elegir con inteligencia alimentos de cada uno de los 5grupos que puede ayudarlo a seguir por el buen camino en los restaurantes.  Opte por los alimentos hervidos, asados, cocidos a la parrilla o al vapor.  Elija el agua, la [place], [place] t helado sin azcar u otras bebidas que no contengan azcares agregados. Si desea una bebida alcohlica, escoja una opcin con menos caloras. Por ejemplo, un margarita normal puede [employer], y un vaso de vino tiene unas 150.  No coma alimentos que contengan mantequilla, estn empanados, fritos o que se sirvan con salsa a base de crema. Generalmente, los alimentos que se etiquetan como "crujientes" estn fritos, a menos que se indique lo contrario.  Ordene los Kimberly-Clark, las salsas y los jarabes aparte, ya que suelen tener muchas caloras; por lo tanto, no los consuma en grandes cantidades.  Tenga cuidado con las [place]. Muchas personas piensan que las ensaladas son Ardelia Mems opcin saludable, pero en muchas cosas, esto no es as. Hay muchas ensaladas que contienen tocino, pollo frito, grandes cantidades de [place], patatas fritas y Lexicographer. Todos estos productos son altamente calricos. Si desea Katherine Mantle, elija una de hortalizas y pida carnes a la parrilla o un filete. Ordene el aderezo aparte o pida aceite de [place] y vinagre o limn para Haematologist.  Haga un clculo estimativo de la cantidad de porciones que le sirven. Por ejemplo, una porcin de arroz cocido equivale a media taza o tiene el tamao aproximado de un molde de [place], o de media pelota de tenis. Conocer el tamao de las porciones lo ayudar a Personnel officer atento a la cantidad de comida que come Occidental Petroleum. La lista que sigue le [place] el tamao de algunas porciones comunes a partir de objetos cotidianos. ? 1onza (28g) = 4dados apilados. ? 3onzas (85g) = 31mazo de cartas. ? 1cucharadita = 1dado. ? 1cucharada = media pelota de tenis de mesa. ? 2cucharadas = 1pelota de tenis de mesa. ? Media taza = 1pelota de tenis o 27molde de magdalena. ? 1taza = 1 pelota de bisbol.  Esta informacin no tiene Marine scientist el consejo del mdico. Asegrese de hacerle al mdico cualquier pregunta que tenga. Document Released: 02/10/2009 Document Revised: 11/15/2014 Document Reviewed: 08/30/2013 Elsevier Interactive Patient Education  2017 [employer].

## 2017-09-05 NOTE — Progress Notes (Signed)
Stephanie Gibson [birth year] [id number]   History:    51 y.o. [id number]  RP:  Established patient presenting for annual gyn exam   HPI:  S/P RSO many years ago and LAVH/LSO in 2016.  Well on Estrogen-MethylTestosterone.  No pelvic pain.  Breasts normal.  Mictions normal.  BMs wnl.  BMI 32.50.    Past medical history,surgical history, family history and social history were all reviewed and documented in the EPIC chart.  Gynecologic History Patient's last menstrual period was 08/10/2015 (approximate). Contraception: status post hysterectomy Last Pap: 2015. Results were: normal Last mammogram: 08/2016. Results were: normal [place] never Bone Density never  Obstetric History OB History  Gravida Para Term Preterm AB Living  4 2     2 2   SAB TAB Ectopic Multiple Live Births  1   1        # Outcome Date GA Lbr Len/2nd Weight Sex Delivery Anes PTL Lv  4 Ectopic           3 Para           2 Para           1 SAB                ROS: A ROS was performed and pertinent positives and negatives are included in the history.  GENERAL: No fevers or chills. HEENT: No change in vision, no earache, sore throat or sinus congestion. NECK: No pain or stiffness. CARDIOVASCULAR: No chest pain or pressure. No palpitations. PULMONARY: No shortness of breath, cough or wheeze. GASTROINTESTINAL: No abdominal pain, nausea, vomiting or diarrhea, melena or bright red blood per rectum. GENITOURINARY: No urinary frequency, urgency, hesitancy or dysuria. MUSCULOSKELETAL: No joint or muscle pain, no back pain, no recent trauma. DERMATOLOGIC: No rash, no itching, no lesions. ENDOCRINE: No polyuria, polydipsia, no heat or cold intolerance. No recent change in weight. HEMATOLOGICAL: No anemia or easy bruising or bleeding. NEUROLOGIC: No headache, seizures, numbness, tingling or weakness. PSYCHIATRIC: No depression, no loss of interest in normal activity or change in sleep pattern.     Exam:   BP (!) 160/88   Ht  5' 2.75" (1.594 m)   Wt 182 lb (82.6 kg)   LMP 08/10/2015 (Approximate) Comment: continuous bleeding/spotting for months  BMI 32.50 kg/m   Body mass index is 32.5 kg/m.  General appearance : Well developed well nourished female. No acute distress HEENT: Eyes: no retinal hemorrhage or exudates,  Neck supple, trachea midline, no carotid bruits, no thyroidmegaly Lungs: Clear to auscultation, no rhonchi or wheezes, or rib retractions  Heart: Regular rate and rhythm, no murmurs or gallops Breast:Examined in sitting and supine position were symmetrical in appearance, no palpable masses or tenderness,  no skin retraction, no nipple inversion, no nipple discharge, no skin discoloration, no axillary or supraclavicular lymphadenopathy Abdomen: no palpable masses or tenderness, no rebound or guarding Extremities: no edema or skin discoloration or tenderness  Pelvic: Vulva normal  Bartholin, Urethra, Skene Glands: Within normal limits             Vagina: No gross lesions or discharge.  Pap reflex done.  Cervix/Uterus absent  Adnexa  Without masses or tenderness  Anus and perineum  normal   Assessment/Plan:  51 y.o. female for annual exam   1. Encounter for gynecological examination with abnormal finding Gyn exam s/p LAVH/BSO.  Pap reflex done.  Breasts wnl.  Will schedule screening Mammo.  2. Hx of total hysterectomy  3. Surgical  menopause on hormone replacement therapy Well on Estrogen-MethylTestosterone, represcribed.  Vitamin D supplements.  Calcium and food.  Weightbearing physical activity. - DG Bone Density; Future  4. Class 1 obesity due to excess calories without serious comorbidity with body mass index (BMI) of 32.0 to 32.9 in adult Low carb low, calorie diet discussed.  Encouraged to increase her physical activity  Princess Bruins MD, 4:12 PM 09/05/2017

## 2017-09-07 ENCOUNTER — Encounter: Payer: Self-pay | Admitting: Gastroenterology

## 2017-09-07 ENCOUNTER — Telehealth: Payer: Self-pay | Admitting: *Deleted

## 2017-09-07 DIAGNOSIS — Z1211 Encounter for screening for malignant neoplasm of colon: Secondary | ICD-10-CM

## 2017-09-07 LAB — PAP IG W/ RFLX HPV ASCU

## 2017-09-07 NOTE — Telephone Encounter (Signed)
-----   Message from Princess Bruins, MD sent at 09/05/2017  4:33 PM EDT ----- Regarding: Needs screening Colonoscopy Never done.

## 2017-09-07 NOTE — Telephone Encounter (Signed)
Referral placed at [facility] they will contact pt and schedule.

## 2017-09-08 NOTE — Telephone Encounter (Signed)
Appointment on 11/04/17 at [employer]

## 2017-10-03 ENCOUNTER — Other Ambulatory Visit: Payer: Self-pay | Admitting: Gynecology

## 2017-10-03 ENCOUNTER — Other Ambulatory Visit: Payer: Self-pay | Admitting: Obstetrics & Gynecology

## 2017-10-03 DIAGNOSIS — Z13820 Encounter for screening for osteoporosis: Secondary | ICD-10-CM

## 2017-10-03 DIAGNOSIS — E8940 Asymptomatic postprocedural ovarian failure: Secondary | ICD-10-CM

## 2017-10-03 DIAGNOSIS — Z79890 Hormone replacement therapy (postmenopausal): Secondary | ICD-10-CM

## 2017-10-10 ENCOUNTER — Telehealth: Payer: Self-pay | Admitting: Family Medicine

## 2017-10-10 NOTE — Telephone Encounter (Signed)
Patient needs her Amlodipine refilled--she would like to get it sent to the [facility] on [address]--looking at the bottle she brought in it looks like she has more refills at [facility] but patient stated someone at [facility] called and they told them they she does not have any more refills.  Her call back number is [phone]

## 2017-10-11 ENCOUNTER — Other Ambulatory Visit: Payer: Self-pay | Admitting: Family Medicine

## 2017-10-11 DIAGNOSIS — I10 Essential (primary) hypertension: Secondary | ICD-10-CM

## 2017-10-11 NOTE — Telephone Encounter (Signed)
Informed patient 1 year supply w [address]

## 2017-10-24 ENCOUNTER — Ambulatory Visit (INDEPENDENT_AMBULATORY_CARE_PROVIDER_SITE_OTHER): Payer: BLUE CROSS/BLUE SHIELD

## 2017-10-24 DIAGNOSIS — Z79890 Hormone replacement therapy (postmenopausal): Secondary | ICD-10-CM | POA: Diagnosis not present

## 2017-10-24 DIAGNOSIS — Z13820 Encounter for screening for osteoporosis: Secondary | ICD-10-CM | POA: Diagnosis not present

## 2017-10-24 DIAGNOSIS — E8940 Asymptomatic postprocedural ovarian failure: Secondary | ICD-10-CM

## 2017-10-25 ENCOUNTER — Encounter: Payer: Self-pay | Admitting: Gynecology

## 2017-11-18 ENCOUNTER — Ambulatory Visit: Payer: BLUE CROSS/BLUE SHIELD | Admitting: Physician Assistant

## 2017-11-18 ENCOUNTER — Encounter: Payer: BLUE CROSS/BLUE SHIELD | Admitting: Gastroenterology

## 2017-12-09 ENCOUNTER — Ambulatory Visit (AMBULATORY_SURGERY_CENTER): Payer: Self-pay | Admitting: *Deleted

## 2017-12-09 ENCOUNTER — Other Ambulatory Visit: Payer: Self-pay

## 2017-12-09 VITALS — Ht 62.75 in | Wt 182.0 lb

## 2017-12-09 DIAGNOSIS — Z1211 Encounter for screening for malignant neoplasm of colon: Secondary | ICD-10-CM

## 2017-12-09 MED ORDER — NA SULFATE-K SULFATE-MG SULF 17.5-3.13-1.6 GM/177ML PO SOLN: ORAL | 0 refills | Status: DC

## 2017-12-09 NOTE — Progress Notes (Signed)
Interpreter present during pv today interpreting for patient. Pt's spouse also present. Patient denies any allergies to eggs or soy. Patient denies any problems with anesthesia/sedation. Patient denies any oxygen use at home. Patient denies taking any diet/weight loss medications or blood thinners. $50 suprep coupon given to pt.

## 2017-12-23 ENCOUNTER — Ambulatory Visit (AMBULATORY_SURGERY_CENTER): Payer: BLUE CROSS/BLUE SHIELD | Admitting: Gastroenterology

## 2017-12-23 ENCOUNTER — Encounter: Payer: Self-pay | Admitting: Gastroenterology

## 2017-12-23 ENCOUNTER — Other Ambulatory Visit: Payer: Self-pay

## 2017-12-23 VITALS — BP 135/79 | HR 63 | Temp 98.9°F | Resp 18 | Ht 62.75 in | Wt 182.0 lb

## 2017-12-23 DIAGNOSIS — D120 Benign neoplasm of cecum: Secondary | ICD-10-CM

## 2017-12-23 DIAGNOSIS — D129 Benign neoplasm of anus and anal canal: Secondary | ICD-10-CM

## 2017-12-23 DIAGNOSIS — D128 Benign neoplasm of rectum: Secondary | ICD-10-CM | POA: Diagnosis not present

## 2017-12-23 DIAGNOSIS — Z1211 Encounter for screening for malignant neoplasm of colon: Secondary | ICD-10-CM | POA: Diagnosis not present

## 2017-12-23 MED ORDER — SODIUM CHLORIDE 0.9 % IV SOLN: 500.0000 mL | Freq: Once | INTRAVENOUS | Status: DC

## 2017-12-23 NOTE — Progress Notes (Signed)
Called to room to assist during endoscopic procedure.  Patient ID and intended procedure confirmed with present staff. Received instructions for my participation in the procedure from the performing physician.  

## 2017-12-23 NOTE — Op Note (Signed)
[place] Patient Name: Stephanie Gibson Procedure Date: 12/23/2017 9:49 AM MRN: [id number] Endoscopist: Mauri Pole , MD Age: 52 Referring MD:  Date of Birth: 1966-09-10 Gender: Female Account #: [id number] Procedure:                Colonoscopy Indications:              Screening for colorectal malignant neoplasm Medicines:                Monitored Anesthesia Care Procedure:                Pre-Anesthesia Assessment:                           - Prior to the procedure, a History and Physical                            was performed, and patient medications and                            allergies were reviewed. The patient's tolerance of                            previous anesthesia was also reviewed. The risks                            and benefits of the procedure and the sedation                            options and risks were discussed with the patient.                            All questions were answered, and informed consent                            was obtained. Prior Anticoagulants: The patient has                            taken no previous anticoagulant or antiplatelet                            agents. ASA Grade Assessment: II - A patient with                            mild systemic disease. After reviewing the risks                            and benefits, the patient was deemed in                            satisfactory condition to undergo the procedure.                           After obtaining informed consent, the colonoscope  was passed under direct vision. Throughout the                            procedure, the patient's blood pressure, pulse, and                            oxygen saturations were monitored continuously. The                            Colonoscope was introduced through the anus and                            advanced to the the cecum, identified by                            appendiceal  orifice and ileocecal valve. The                            colonoscopy was performed without difficulty. The                            patient tolerated the procedure well. The quality                            of the bowel preparation was excellent. The                            ileocecal valve, appendiceal orifice, and rectum                            were photographed. Scope In: 10:45:11 AM Scope Out: 52:77:82 AM Scope Withdrawal Time: 0 hours 13 minutes 5 seconds  Total Procedure Duration: 0 hours 16 minutes 7 seconds  Findings:                 The perianal and digital rectal examinations were                            normal.                           A 2 mm polyp was found in the cecum. The polyp was                            sessile. The polyp was removed with a cold biopsy                            forceps. Resection and retrieval were complete.                           A 3 mm polyp was found in the rectum. The polyp was                            sessile. The polyp was removed with a cold snare.  Resection and retrieval were complete.                           A few small-mouthed diverticula were found in the                            sigmoid colon.                           Non-bleeding internal hemorrhoids were found during                            retroflexion. The hemorrhoids were small. Complications:            No immediate complications. Estimated Blood Loss:     Estimated blood loss: none. Impression:               - One 2 mm polyp in the cecum, removed with a cold                            biopsy forceps. Resected and retrieved.                           - One 3 mm polyp in the rectum, removed with a cold                            snare. Resected and retrieved.                           - Diverticulosis in the sigmoid colon.                           - Non-bleeding internal hemorrhoids. Recommendation:           - Patient has a  contact number available for                            emergencies. The signs and symptoms of potential                            delayed complications were discussed with the                            patient. Return to normal activities tomorrow.                            Written discharge instructions were provided to the                            patient.                           - Resume previous diet.                           - Continue present medications.                           -  Await pathology results.                           - Repeat colonoscopy in 5-10 years for surveillance                            based on pathology results. Mauri Pole, MD 12/23/2017 11:06:28 AM This report has been signed electronically.

## 2017-12-23 NOTE — Patient Instructions (Signed)
YOU HAD AN ENDOSCOPIC PROCEDURE TODAY AT THE [facility]:   Refer to the procedure report that was given to you for any specific questions about what was found during the examination.  If the procedure report does not answer your questions, please call your gastroenterologist to clarify.  If you requested that your care partner not be given the details of your procedure findings, then the procedure report has been included in a sealed envelope for you to review at your convenience later.  YOU SHOULD EXPECT: Some feelings of bloating in the abdomen. Passage of more gas than usual.  Walking can help get rid of the air that was put into your GI tract during the procedure and reduce the bloating. If you had a lower endoscopy (such as a colonoscopy or flexible sigmoidoscopy) you may notice spotting of blood in your stool or on the toilet paper. If you underwent a bowel prep for your procedure, you may not have a normal bowel movement for a few days.  Please Note:  You might notice some irritation and congestion in your nose or some drainage.  This is from the oxygen used during your procedure.  There is no need for concern and it should clear up in a day or so.  SYMPTOMS TO REPORT IMMEDIATELY:   Following lower endoscopy (colonoscopy or flexible sigmoidoscopy):  Excessive amounts of blood in the stool  Significant tenderness or worsening of abdominal pains  Swelling of the abdomen that is new, acute  Fever of 100F or higher  For urgent or emergent issues, a gastroenterologist can be reached at any hour by calling [phone].   DIET:  We do recommend a small meal at first, but then you may proceed to your regular diet.  Drink plenty of fluids but you should avoid alcoholic beverages for 24 hours.  ACTIVITY:  You should plan to take it easy for the rest of today and you should NOT DRIVE or use heavy machinery until tomorrow (because of the sedation medicines used during the test).     FOLLOW UP: Our staff will call the number listed on your records the next business day following your procedure to check on you and address any questions or concerns that you may have regarding the information given to you following your procedure. If we do not reach you, we will leave a message.  However, if you are feeling well and you are not experiencing any problems, there is no need to return our call.  We will assume that you have returned to your regular daily activities without incident.  If any biopsies were taken you will be contacted by phone or by letter within the next 1-3 weeks.  Please call us at [phone] if you have not heard about the biopsies in 3 weeks.    SIGNATURES/CONFIDENTIALITY: You and/or your care partner have signed paperwork which will be entered into your electronic medical record.  These signatures attest to the fact that that the information above on your After Visit Summary has been reviewed and is understood.  Full responsibility of the confidentiality of this discharge information lies with you and/or your care-partner. 

## 2017-12-23 NOTE — Progress Notes (Signed)
Report to PACU, RN, vss, BBS= Clear.  

## 2017-12-23 NOTE — Progress Notes (Signed)
Pt. Reports no change in her medical or surgical history since her pre-visit 12/09/2017. 

## 2017-12-26 ENCOUNTER — Telehealth: Payer: Self-pay | Admitting: *Deleted

## 2017-12-26 NOTE — Telephone Encounter (Signed)
No answer, message left for the patient. 

## 2017-12-26 NOTE — Telephone Encounter (Signed)
  Follow up Call-  Call back number 12/23/2017  Post procedure Call Back phone  # [phone]  Pt. does not speak English.  Spanish interpreter please call.  Permission to leave phone message Yes  Some recent data might be hidden     Patient questions:  Do you have a fever, pain , or abdominal swelling? No. Pain Score  0 *  Have you tolerated food without any problems? Yes.    Have you been able to return to your normal activities? Yes.    Do you have any questions about your discharge instructions: Diet   No. Medications  No. Follow up visit  No.  Do you have questions or concerns about your Care? No.  Actions: * If pain score is 4 or above: No action needed, pain <4.

## 2017-12-27 ENCOUNTER — Encounter: Payer: Self-pay | Admitting: Gastroenterology

## 2018-02-20 ENCOUNTER — Other Ambulatory Visit: Payer: Self-pay

## 2018-02-20 ENCOUNTER — Ambulatory Visit: Payer: BLUE CROSS/BLUE SHIELD | Admitting: Physician Assistant

## 2018-02-20 ENCOUNTER — Encounter: Payer: Self-pay | Admitting: Physician Assistant

## 2018-02-20 VITALS — BP 134/80 | HR 69 | Temp 98.2°F | Ht 62.0 in | Wt 187.0 lb

## 2018-02-20 DIAGNOSIS — I10 Essential (primary) hypertension: Secondary | ICD-10-CM

## 2018-02-20 DIAGNOSIS — N951 Menopausal and female climacteric states: Secondary | ICD-10-CM

## 2018-02-20 DIAGNOSIS — F4322 Adjustment disorder with anxiety: Secondary | ICD-10-CM | POA: Diagnosis not present

## 2018-02-20 LAB — POCT URINALYSIS DIP (MANUAL ENTRY)
Bilirubin, UA: NEGATIVE
Blood, UA: NEGATIVE
Glucose, UA: NEGATIVE mg/dL
Ketones, POC UA: NEGATIVE mg/dL
Leukocytes, UA: NEGATIVE
Nitrite, UA: NEGATIVE
Protein Ur, POC: NEGATIVE mg/dL
Spec Grav, UA: 1.015 (ref 1.010–1.025)
Urobilinogen, UA: 0.2 U/dL
pH, UA: 8.5 — AB (ref 5.0–8.0)

## 2018-02-20 MED ORDER — AMLODIPINE BESYLATE 5 MG PO TABS: 5.0000 mg | ORAL_TABLET | Freq: Every day | ORAL | 3 refills | Status: DC

## 2018-02-20 MED ORDER — HYDROXYZINE PAMOATE 100 MG PO CAPS: 100.0000 mg | ORAL_CAPSULE | Freq: Every day | ORAL | 1 refills | Status: AC

## 2018-02-20 MED ORDER — ESTRADIOL 1 MG PO TABS: 1.0000 mg | ORAL_TABLET | Freq: Every day | ORAL | 3 refills | Status: DC

## 2018-02-20 NOTE — Progress Notes (Signed)
Stephanie Gibson  Stephanie Gibson 

## 2018-02-20 NOTE — Patient Instructions (Addendum)
Thank you for coming in today. I hope you feel we met your needs. Please see below for foods to eat to help lower your blood pressure.  Feel free to call PCP if you have any questions or further requests. Please consider signing up for MyChart if you do not already have it, as this is a great way to communicate with me.  Best,  Whitney McVey, PA-C   Plan de alimentacin DASH DASH Eating Plan DASH es la sigla en ingls de "Enfoques Alimentarios para Detener la Hipertensin" (Dietary Approaches to Stop Hypertension). El plan de alimentacin DASH ha demostrado bajar la presin arterial elevada (hipertensin). Tambin puede reducir UnitedHealth de diabetes tipo 2, enfermedad cardaca y accidente cerebrovascular. Este plan tambin puede ayudar a Horticulturist, commercial. Consejos para seguir este plan Pautas generales  Evite ingerir ms de 2,300 mg (miligramos) de sal (sodio) por da. Si tiene hipertensin, es posible que necesite reducir la ingesta de sodio a 1,500 mg por da.  Limite el consumo de alcohol a no ms de 81mdida por da si es mujer y no est e[place] y 272midas por da si es hombre. Una medida equivale a 12oz (35542mde cerveza, 5oz (148m34me vino o 1oz (44ml37m bebidas alcohlicas de alta graduacin.  Trabaje con su mdico para mantener un peso saludable o perder peso.[place] Mediagntele cul es el peso recomendado para usted.  Realice al menos 30 minutos de ejercicio que haga que se acelere su corazn (ejercicio aerbiArboriculturistmayorHartford Financiala semanBucksas actividades pueden incluir caminar, nadar o andar en bicicleta.  Trabaje con su mdico o especialista en alimentacin y nutricin (nutricionista) para ajustar su plan alimentario a sus necesidades calricas personales. Lectura de las etiquetas de los alimentos  Verifique en las etiquetas de los alimentos, la cantidad de sodio por porcin. Elija alimentos con menos del 5 por ciento del valor diario de sodio. Generalmente, los  alimentos con menos de 300 mg de sodio por porcin se encuadran dentro de este plan alimentario.  Para encontrar cereales integrales, busque la palabra "integral" como primera palabra en la lista de ingredientes. De compras  Compre productos en los que en su etiqueta diga: "bajo contenido de sodio" o "sin agregado de sal".  Compre alimentos frescos. Evite los alimentos enlatados y comidas precocidas o congeladas. Coccin  Evite agregar sal cuando cocine. Use hierbas o aderezos sin sal, en lugar de sal de mesa o sal marina. Consulte al mdico o farmacutico antes de usar sustitutos de la sal.  No fra los alimentos. A la hora de cocinar los alimentos opte por hornearlos, hervirlos, grillarlos y asarlos a la paAdministrator, artscine con aceites cardiosaludables, como oliva, canola, soja o girasol. Planificacin de las comidas   Consuma una dieta equilibrada, que incluya lo siguiente: ? 5o ms porciones de frutas y verduSet designerte de que la mitad del plato de cada comida sean frutas y verduras. ? Hasta 6 u 8 porciones de cereales integrales por da. ? Menos de 6 onzas de carne, aves o pescado magroGames developer porcin de 3 onzas de carne tiene casi el mismo tamao que un mazo de cartas. Un huevo equivale a 1 onza. ? Dos porciones de productos lcteos descremados por da. ?Training and development officerna porcin de frutos secos, semillas o frijoles 5 veces por semana. ? Grasas cardiosaludables. Las grasas saludables llamadas cidos grasos omega-3 se encuentran en alimentos como semillas de lino y pescados de agua fra, como por ejemplo, sardinas,  salmn y caballa.  Limite la cantidad que ingiere de los siguientes alimentos: ? Alimentos enlatados o envasados. ? Alimentos con alto contenido de grasa trans, como alimentos fritos. ? Alimentos con alto contenido de grasa saturada, como carne con grasa. ? Dulces, postres, bebidas azucaradas y otros alimentos con azcar agregada. ? Productos lcteos enteros.  No le  agregue sal a los alimentos antes de probarlos.  Trate de comer al menos 2 comidas vegetarianas por semana.  Consuma ms comida casera y menos de restaurante, de bufs y comida rpida.  Cuando coma en un restaurante, pida que preparen su comida con menos sal o, en lo posible, sin nada de sal. Qu alimentos se recomiendan? Los alimentos enumerados a continuacin no constituyen Furniture conservator/restorer. Hable con el nutricionista sobre las mejores opciones alimenticias para usted. Cereales Pan de salvado o integral. Pasta de salvado o integral. Arroz integral. Avena. Quinua. Trigo burgol. Cereales integrales y con bajo contenido de sodio. Pan pita. Galletitas de [place] con bajo contenido de [place] y [place]. Tortillas de [place] integral. Verduras Verduras frescas o congeladas (crudas, al vapor, asadas o grilladas). Jugos de tomate y verduras con bajo contenido de sodio o reducidos en sodio. Salsa y pasta de tomate con bajo contenido de sodio o reducidas en sodio. Verduras enlatadas con bajo contenido de sodio o reducidas en sodio. Frutas Todas las frutas frescas, congeladas o disecadas. Frutas enlatadas en jugo natural (sin agregado de azcar). Carne y otros alimentos proteicos Pollo o pavo sin piel. Carne de pollo o de [place]. Cerdo desgrasado. Pescado y [employer]. Claras de huevo. Porotos, guisantes o lentejas secos. Frutos secos, mantequilla de frutos secos y semillas sin sal. Frijoles enlatados sin sal. Cortes de carne vacuna magra, desgrasada. Embutidos magros, con bajo contenido de [place]. Lcteos Leche descremada (1%) o descremada. Quesos sin grasa, con bajo contenido de grasa o descremados. Queso blanco o ricota sin grasa, con bajo contenido de [place]. Yogur semidescremado o descremado. Queso con bajo contenido de [place] y [place]. Grasas y [employer] untables que no contengan grasas trans. Aceite vegetal. Lubertha Basque y aderezos para ensaladas livianos o con bajo contenido de grasas (reducidos  en sodio). Aceite de canola, crtamo, oliva, soja y [place]. [place]. Condimentos y otros alimentos Hierbas. Especias. Mezclas de condimentos sin sal. Palomitas de maz y pretzels sin sal. Dulces con bajo contenido de grasas. Qu alimentos no se recomiendan? Los alimentos enumerados a continuacin no constituyen Furniture conservator/restorer. Hable con el nutricionista sobre las mejores opciones alimenticias para usted. Cereales Productos de panificacin hechos con grasa, como medialunas, magdalenas y algunos panes. Comidas con arroz o pasta seca listas para usar. Verduras Verduras con crema o fritas. Verduras en [place]. Verduras enlatadas regulares (que no sean con bajo contenido de sodio o reducidas en sodio). Pasta y salsa de tomates enlatadas regulares (que no sean con bajo contenido de sodio o reducidas en sodio). Jugos de tomate y verduras regulares (que no sean con bajo contenido de sodio o reducidos en sodio). Pepinillos. Aceitunas. Lambert Mody Fruta enlatada en almbar liviano o espeso. Frutas cocidas en aceite. Frutas con salsa de crema o [place]. Carne y otros alimentos proteicos Cortes de carne con grasa. Costillas. Carne frita. [place]. Salchichas. Mortadela y otras carnes procesadas. Salame. Panceta. Perros calientes (hotdogs). [place]. Frutos secos y semillas con sal. Frijoles enlatados con agregado de sal. Pescado enlatado o ahumado. Huevos enteros o yemas. Pollo o pavo con piel. Lcteos Leche entera o al 2%, crema y Myton  y mitad crema. Queso crema entero o con toda su grasa. Yogur entero o endulzado. Quesos con toda su grasa. Sustitutos de cremas no lcteas. Coberturas batidas. Quesos para untar y quesos procesados. Grasas y [employer]. Margarina en barra. [place]. Materia grasa. Mantequilla clarificada. Grasa de panceta. Aceites tropicales como aceite de coco, palmiste o palma. Condimentos y otros alimentos Palomitas de maz y pretzels con sal. Sal de  cebolla, sal de ajo, sal condimentada, sal de mesa y sal marina. Salsa Worcestershire. Salsa trtara. Salsa barbacoa. Salsa teriyaki. Salsa de soja, incluso la que tiene contenido reducido de [place]. Salsa de carne. Salsas en lata y envasadas. Salsa de pescado. Salsa de [place]. Salsa rosada. Rbano picante envasado. [place]. Mostaza. Saborizantes y tiernizantes para carne. Caldo en cubitos. Salsa picante y salsa tabasco. Escabeches envasados o ya preparados. Aderezos para tacos prefabricados o envasados. Salsas. Aderezos comunes para ensalada. Dnde encontrar ms informacin:  [place], los Pulmones y Herbalist (National Heart, Lung, and [place]): https://wilson-eaton.com/  Asociacin Estadounidense del Corazn (American Heart Association): www.heart.org Resumen  El plan de alimentacin DASH ha demostrado bajar la presin arterial elevada (hipertensin). Tambin puede reducir UnitedHealth de diabetes tipo 2, enfermedad cardaca y accidente cerebrovascular.  Con el plan de alimentacin DASH, deber limitar el consumo de sal (sodio) a 2,300 mg por da. Si tiene hipertensin, es posible que necesite reducir la ingesta de sodio a 1,500 mg por da.  Cuando siga el plan de alimentacin DASH, trate de comer ms frutas frescas y verduras, cereales integrales, carnes magras, lcteos descremados y grasas cardiosaludables.  Trabaje con su mdico o especialista en alimentacin y nutricin (nutricionista) para ajustar su plan alimentario a sus necesidades calricas personales. Esta informacin no tiene Marine scientist el consejo del mdico. Asegrese de hacerle al mdico cualquier pregunta que tenga. Document Released: 10/14/2011 Document Revised: 02/14/2017 Document Reviewed: 02/14/2017 Elsevier Interactive Patient Education  [employer].
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
## 2018-02-21 LAB — CMP14+EGFR
ALT: 29 IU/L (ref 0–32)
AST: 20 IU/L (ref 0–40)
Albumin/Globulin Ratio: 1.4 (ref 1.2–2.2)
Albumin: 4.2 g/dL (ref 3.5–5.5)
Alkaline Phosphatase: 102 IU/L (ref 39–117)
BUN/Creatinine Ratio: 13 (ref 9–23)
BUN: 12 mg/dL (ref 6–24)
Bilirubin Total: <0.2 mg/dL (ref 0.0–1.2)
CO2: 24 mmol/L (ref 20–29)
Calcium: 9.4 mg/dL (ref 8.7–10.2)
Chloride: 106 mmol/L (ref 96–106)
Creatinine, Ser: 0.92 mg/dL (ref 0.57–1.00)
GFR calc Af Amer: 82 mL/min/{1.73_m2} (ref >59)
GFR calc non Af Amer: 71 mL/min/{1.73_m2} (ref >59)
Globulin, Total: 2.9 g/dL (ref 1.5–4.5)
Glucose: 102 mg/dL — ABNORMAL HIGH (ref 65–99)
Potassium: 4.1 mmol/L (ref 3.5–5.2)
Sodium: 146 mmol/L — ABNORMAL HIGH (ref 134–144)
Total Protein: 7.1 g/dL (ref 6.0–8.5)

## 2018-02-21 LAB — LIPID PANEL
Chol/HDL Ratio: 4.2 ratio (ref 0.0–4.4)
Cholesterol, Total: 196 mg/dL (ref 100–199)
HDL: 47 mg/dL (ref >39)
LDL Calculated: 125 mg/dL — ABNORMAL HIGH (ref 0–99)
Triglycerides: 122 mg/dL (ref 0–149)
VLDL Cholesterol Cal: 24 mg/dL (ref 5–40)

## 2018-03-01 ENCOUNTER — Encounter: Payer: Self-pay | Admitting: Physician Assistant

## 2018-04-08 ENCOUNTER — Encounter: Payer: Self-pay | Admitting: Family Medicine

## 2018-04-08 ENCOUNTER — Other Ambulatory Visit: Payer: Self-pay

## 2018-04-08 ENCOUNTER — Ambulatory Visit: Payer: BLUE CROSS/BLUE SHIELD | Admitting: Family Medicine

## 2018-04-08 VITALS — BP 132/82 | HR 65 | Temp 98.0°F | Resp 16 | Ht 65.35 in | Wt 184.6 lb

## 2018-04-08 DIAGNOSIS — H6123 Impacted cerumen, bilateral: Secondary | ICD-10-CM

## 2018-04-08 DIAGNOSIS — R300 Dysuria: Secondary | ICD-10-CM | POA: Diagnosis not present

## 2018-04-08 LAB — POCT URINALYSIS DIP (MANUAL ENTRY)
Bilirubin, UA: NEGATIVE
Blood, UA: NEGATIVE
Glucose, UA: NEGATIVE mg/dL
Ketones, POC UA: NEGATIVE mg/dL
Nitrite, UA: NEGATIVE
Protein Ur, POC: NEGATIVE mg/dL
Spec Grav, UA: 1.015 (ref 1.010–1.025)
Urobilinogen, UA: 0.2 E.U./dL
pH, UA: 7.5 (ref 5.0–8.0)

## 2018-04-08 LAB — POC MICROSCOPIC URINALYSIS (UMFC): Mucus: ABSENT

## 2018-04-08 MED ORDER — NITROFURANTOIN MONOHYD MACRO 100 MG PO CAPS: 100.0000 mg | ORAL_CAPSULE | Freq: Two times a day (BID) | ORAL | 0 refills | Status: DC

## 2018-04-08 NOTE — Patient Instructions (Addendum)
     IF you received an x-ray today, you will receive an invoice from [facility]. Please contact [facility] at [phone] with questions or concerns regarding your invoice.   IF you received labwork today, you will receive an invoice from [facility]. Please contact [facility] at [phone] with questions or concerns regarding your invoice.   Our billing staff will not be able to assist you with questions regarding bills from these companies.  You will be contacted with the lab results as soon as they are available. The fastest way to get your results is to activate your My Chart account. Instructions are located on the last page of this paperwork. If you have not heard from us regarding the results in 2 weeks, please contact this office.     Infeccin urinaria en los adultos (Urinary Tract Infection, Adult) Una infeccin urinaria (IU) puede ocurrir en cualquier lugar de las vas urinarias. Las vas urinarias incluyen lo siguiente:  Riones.  Urteres.  Vejiga.  Uretra. Estos rganos fabrican, almacenan y eliminan la orina del organismo. CUIDADOS EN EL HOGAR  Tome los medicamentos de venta libre y los recetados solamente como se lo haya indicado el mdico.  Si le recetaron un antibitico, tmelo como se lo haya indicado el mdico. No deje de tomar los antibiticos aunque comience a sentirse mejor.  Evite beber lo siguiente: ? Alcohol. ? Cafena. ? T. ? Bebidas con gas.  Beba suficiente lquido para mantener el pis claro o de color amarillo plido.  Concurra a todas las visitas de control como se lo haya indicado el mdico. Esto es importante.  Asegrese de lo siguiente: ? Vaciar la vejiga con frecuencia y en su totalidad. No contener la orina durante largos perodos. ? Vaciar la vejiga antes y despus de tener relaciones sexuales. ? Limpiar de adelante hacia atrs despus de defecar, si es mujer. Usar cada trozo de papel una vez cuando se  limpie. SOLICITE AYUDA SI:  Siente dolor en la espalda.  Tiene fiebre.  Siente malestar estomacal (nuseas).  Vomita.  Los sntomas no mejoran despus de 3das de tratamiento.  Los sntomas desaparecen y luego reaparecen. SOLICITE AYUDA DE INMEDIATO SI:  Siente un dolor muy intenso en la espalda.  Siente un dolor muy intenso en la parte inferior del abdomen.  Tiene vmitos y no puede retener los medicamentos ni el agua. Esta informacin no tiene como fin reemplazar el consejo del mdico. Asegrese de hacerle al mdico cualquier pregunta que tenga. Document Released: 04/14/2010 Document Revised: 02/16/2016 Document Reviewed: 09/15/2015 Elsevier Interactive Patient Education  2018 Elsevier Inc.  

## 2018-04-08 NOTE — Progress Notes (Signed)
6/1/201911:25 AM  Stephanie Gibson [birth year], 52 y.o. female [id number]  Chief Complaint  Patient presents with  . Dysuria    HPI:   Patient is a 52 y.o. female with who presents today for 1 week of dysuria  Had UTI in Jan 2019 1 week of dysuria, suprapubic pressure, achy low back, frequency Has been taking amoxicillin 500mg  q6 hours for past 3 days without any improvement Denies any fever, chills, nausea, vomiting, flank pain, hematuria, vaginal discharge  Also requesting ears to be cleaned, has h/o impacted cerumen, having pain in ears and decreased hearing Has been trying oil drops at home without results  Fall Risk  04/08/2018 02/20/2018 05/28/2017  Falls in the past year? No Yes No     Depression screen [facility] 2/9 04/08/2018 02/20/2018 05/28/2017  Decreased Interest 0 1 0  Down, Depressed, Hopeless 0 0 0  PHQ - 2 Score 0 1 0    No Known Allergies  Prior to Admission medications   Medication Sig Start Date End Date Taking? Authorizing Provider  amLODipine (NORVASC) 5 MG tablet Take 1 tablet (5 mg total) by mouth daily. 02/20/18  Yes McVey, Gelene Mink, PA-C  estradiol (ESTRACE) 1 MG tablet Take 1 tablet (1 mg total) by mouth daily. 02/20/18  Yes McVey, Gelene Mink, PA-C  hydrOXYzine (VISTARIL) 100 MG capsule Take 1 capsule (100 mg total) by mouth at bedtime. 02/20/18  Yes McVey, Gelene Mink, PA-C    Past Medical History:  Diagnosis Date  . Anemia   . Ectopic pregnancy   . Essential hypertension   . Leiomyoma of uterus   . Pneumonia   . Shortness of breath dyspnea     Past Surgical History:  Procedure Laterality Date  . CESAREAN SECTION    . CYSTOSCOPY N/A 10/21/2015   Procedure: CYSTOSCOPY;  Surgeon: Terrance Mass, MD;  Location: [facility];  Service: Gynecology;  Laterality: N/A;  . LAPAROSCOPIC VAGINAL HYSTERECTOMY WITH SALPINGO OOPHORECTOMY N/A 10/21/2015   Procedure: LAPAROSCOPIC ASSISTED VAGINAL HYSTERECTOMY;  Surgeon: Terrance Mass, MD;   Location: [facility];  Service: Gynecology;  Laterality: N/A;  . OOPHORECTOMY    . SALPINGOOPHORECTOMY Left 10/21/2015   Procedure: SALPINGO OOPHORECTOMY;  Surgeon: Terrance Mass, MD;  Location: [facility];  Service: Gynecology;  Laterality: Left;  . TUBAL LIGATION      Social History   Tobacco Use  . Smoking status: Never Smoker  . Smokeless tobacco: Never Used  Substance Use Topics  . Alcohol use: Yes    Alcohol/week: 0.0 oz    Comment: occ    Family History  Problem Relation Age of Onset  . Hypertension Mother   . Cancer Father        THROAT   . Colon cancer Neg Hx     ROS Per hpi  OBJECTIVE:  Blood pressure 132/82, pulse 65, temperature 98 F (36.7 C), temperature source Oral, resp. rate 16, height 5' 5.35" (1.66 m), weight 184 lb 9.6 oz (83.7 kg), last menstrual period 08/10/2015, SpO2 98 %.  Physical Exam  Constitutional: She is oriented to person, place, and time. She appears well-developed and well-nourished.  HENT:  Head: Normocephalic and atraumatic.  Right Ear: Hearing, external ear and ear canal normal.  Left Ear: Hearing, external ear and ear canal normal.  Mouth/Throat: Oropharynx is clear and moist.  Cerumen in bilateral canals, impacted, lavaged successfully by CMA, TMs clear  Eyes: Pupils are equal, round, and reactive to light. EOM are normal.  Neck: Neck  supple.  Cardiovascular: Normal rate, regular rhythm and normal heart sounds. Exam reveals no gallop and no friction rub.  No murmur heard. Pulmonary/Chest: Effort normal and breath sounds normal. She has no wheezes. She has no rales.  Abdominal: There is no CVA tenderness.  Lymphadenopathy:    She has no cervical adenopathy.  Neurological: She is alert and oriented to person, place, and time.  Skin: Skin is warm and dry.  Psychiatric: She has a normal mood and affect.  Nursing note and vitals reviewed.     Results for orders placed or performed in visit on 04/08/18 (from the past 24 hour(s))    POCT urinalysis dipstick     Status: Abnormal   Collection Time: 04/08/18 11:28 AM  Result Value Ref Range   Color, UA yellow yellow   Clarity, UA clear clear   Glucose, UA negative negative mg/dL   Bilirubin, UA negative negative   Ketones, POC UA negative negative mg/dL   Spec Grav, UA 1.015 1.010 - 1.025   Blood, UA negative negative   pH, UA 7.5 5.0 - 8.0   Protein Ur, POC negative negative mg/dL   Urobilinogen, UA 0.2 0.2 or 1.0 E.U./dL   Nitrite, UA Negative Negative   Leukocytes, UA Small (1+) (A) Negative  POCT Microscopic Urinalysis ([facility])     Status: Abnormal   Collection Time: 04/08/18 11:48 AM  Result Value Ref Range   WBC,UR,HPF,POC Moderate (A) None WBC/hpf   RBC,UR,HPF,POC None None RBC/hpf   Bacteria None None, Too numerous to count   Mucus Absent Absent   Epithelial Cells, UR Per Microscopy Few (A) None, Too numerous to count cells/hpf    ASSESSMENT and PLAN  1. Dysuria Symptoms suggestive with UTI in a patient that has been on amox for 3 days. Will treat. RTC precautions reviewed - POCT urinalysis dipstick - Urine Culture - POCT Microscopic Urinalysis ([facility])  2. Bilateral impacted cerumen - Ear wax removal  Other orders - nitrofurantoin, macrocrystal-monohydrate, (MACROBID) 100 MG capsule; Take 1 capsule (100 mg total) by mouth 2 (two) times daily.  Return if symptoms worsen or fail to improve.    Rutherford Guys, MD [facility] [place], [place] [address] Ph.  [phone] Fax [phone]

## 2018-04-12 LAB — URINE CULTURE

## 2018-04-13 NOTE — Progress Notes (Signed)
On appropriate abx, no further action needed

## 2018-07-28 ENCOUNTER — Other Ambulatory Visit: Payer: Self-pay | Admitting: Obstetrics & Gynecology

## 2018-07-28 DIAGNOSIS — Z1231 Encounter for screening mammogram for malignant neoplasm of breast: Secondary | ICD-10-CM

## 2018-08-25 ENCOUNTER — Ambulatory Visit
Admission: RE | Admit: 2018-08-25 | Discharge: 2018-08-25 | Disposition: A | Discharge status: Home / Self Care | Payer: BLUE CROSS/BLUE SHIELD | Source: Ambulatory Visit | Attending: Obstetrics & Gynecology | Admitting: Obstetrics & Gynecology

## 2018-08-25 DIAGNOSIS — Z1231 Encounter for screening mammogram for malignant neoplasm of breast: Secondary | ICD-10-CM

## 2019-01-03 ENCOUNTER — Ambulatory Visit: Payer: BLUE CROSS/BLUE SHIELD | Admitting: Family Medicine

## 2019-03-30 DIAGNOSIS — M1990 Unspecified osteoarthritis, unspecified site: Secondary | ICD-10-CM | POA: Insufficient documentation

## 2019-03-30 DIAGNOSIS — J302 Other seasonal allergic rhinitis: Secondary | ICD-10-CM | POA: Insufficient documentation

## 2019-08-01 ENCOUNTER — Encounter: Payer: Self-pay | Admitting: Gynecology

## 2019-09-28 ENCOUNTER — Encounter: Payer: BLUE CROSS/BLUE SHIELD | Admitting: Obstetrics & Gynecology

## 2019-10-30 ENCOUNTER — Encounter (HOSPITAL_COMMUNITY): Payer: Self-pay

## 2019-10-30 ENCOUNTER — Ambulatory Visit (INDEPENDENT_AMBULATORY_CARE_PROVIDER_SITE_OTHER): Payer: BLUE CROSS/BLUE SHIELD

## 2019-10-30 ENCOUNTER — Ambulatory Visit (HOSPITAL_COMMUNITY)
Admission: EM | Admit: 2019-10-30 | Discharge: 2019-10-30 | Disposition: A | Discharge status: Home / Self Care | Payer: BLUE CROSS/BLUE SHIELD | Attending: Family Medicine | Admitting: Family Medicine

## 2019-10-30 DIAGNOSIS — N390 Urinary tract infection, site not specified: Secondary | ICD-10-CM | POA: Insufficient documentation

## 2019-10-30 DIAGNOSIS — U071 COVID-19: Secondary | ICD-10-CM

## 2019-10-30 DIAGNOSIS — R059 Cough, unspecified: Secondary | ICD-10-CM

## 2019-10-30 DIAGNOSIS — R05 Cough: Secondary | ICD-10-CM | POA: Diagnosis present

## 2019-10-30 LAB — POCT URINALYSIS DIP (DEVICE)
Bilirubin Urine: NEGATIVE
Glucose, UA: NEGATIVE mg/dL
Hgb urine dipstick: NEGATIVE
Ketones, ur: NEGATIVE mg/dL
Nitrite: POSITIVE — AB
Protein, ur: NEGATIVE mg/dL
Specific Gravity, Urine: 1.02 (ref 1.005–1.030)
Urobilinogen, UA: 0.2 mg/dL (ref 0.0–1.0)
pH: 7 (ref 5.0–8.0)

## 2019-10-30 MED ORDER — BENZONATATE 100 MG PO CAPS: 100.0000 mg | ORAL_CAPSULE | Freq: Three times a day (TID) | ORAL | 0 refills | Status: DC

## 2019-10-30 MED ORDER — NITROFURANTOIN MONOHYD MACRO 100 MG PO CAPS: 100.0000 mg | ORAL_CAPSULE | Freq: Two times a day (BID) | ORAL | 0 refills | Status: DC

## 2019-10-30 NOTE — ED Triage Notes (Addendum)
Pt presents to the UC  With back pain when she cough x 2 days. Pt reports having mild shortness of breath when she cough. Pt reports she testes positive for COVID 12 days ago.   Pt reports having burning sensation when uinating x 1 day. Pt took 1 tablet  phenazopyridine.

## 2019-10-30 NOTE — Discharge Instructions (Signed)
Your x ray was normal Tessalon pearls for cough.  Treating you for a urinary tract infection  Take the antibiotics as prescribed.  Rest and drink plenty of water.  Follow up as needed for continued or worsening symptoms

## 2019-10-31 NOTE — ED Provider Notes (Signed)
[facility]    CSN: [id number] Arrival date & time: 10/30/19  1118      History   Chief Complaint Chief Complaint  Patient presents with  . Back Pain    (+ COVID)  . Shortness of Breath  . Dysuria    HPI Stephanie Gibson is a 53 y.o. female.   Patient is a 53 year old female with past medical history of anemia, ectopic pregnancy, essential hypertension, leiomyoma, pneumonia, shortness of breath.  She presents today with upper back pain, chest pain with coughing x2 days.  She has had mild shortness of breath associated.  Patient tested positive for Covid approximately 12 days ago.  Currently not having any fevers and some of her symptoms have resolved.  She is also reporting some dysuria x1 day.  She took 1 Pyridium.  No abdominal pain, flank pain, fevers, nausea or vomiting.  ROS per HPI    Back Pain Associated symptoms: dysuria   Shortness of Breath Dysuria   Past Medical History:  Diagnosis Date  . Anemia   . Ectopic pregnancy   . Essential hypertension   . Leiomyoma of uterus   . Pneumonia   . Shortness of breath dyspnea     Patient Active Problem List   Diagnosis Date Noted  . Menopausal symptom 05/28/2017  . Bilateral impacted cerumen 05/28/2017  . Mild asthma without complication [id number]  . Postoperative anemia due to acute blood loss 11/13/2015  . Postoperative state 10/21/2015  . Chest pain 06/23/2015  . SOB (shortness of breath) 06/23/2015  . Intramural leiomyoma of uterus 05/03/2014  . Overweight(278.02) 04/12/2014  . HTN (hypertension) 04/12/2014    Past Surgical History:  Procedure Laterality Date  . CESAREAN SECTION    . CYSTOSCOPY N/A 10/21/2015   Procedure: CYSTOSCOPY;  Surgeon: Terrance Mass, MD;  Location: [facility];  Service: Gynecology;  Laterality: N/A;  . LAPAROSCOPIC VAGINAL HYSTERECTOMY WITH SALPINGO OOPHORECTOMY N/A 10/21/2015   Procedure: LAPAROSCOPIC ASSISTED VAGINAL HYSTERECTOMY;  Surgeon: Terrance Mass,  MD;  Location: [facility];  Service: Gynecology;  Laterality: N/A;  . OOPHORECTOMY    . SALPINGOOPHORECTOMY Left 10/21/2015   Procedure: SALPINGO OOPHORECTOMY;  Surgeon: Terrance Mass, MD;  Location: [facility];  Service: Gynecology;  Laterality: Left;  . TUBAL LIGATION      OB History    Gravida  4   Para  2   Term      Preterm      AB  2   Living  2     SAB  1   TAB      Ectopic  1   Multiple      Live Births               Home Medications    Prior to Admission medications   Medication Sig Start Date End Date Taking? Authorizing Provider  albuterol (VENTOLIN HFA) 108 (90 Base) MCG/ACT inhaler Inhale into the lungs. 02/16/19  Yes [provider]  amLODipine (NORVASC) 5 MG tablet Take 1 tablet (5 mg total) by mouth daily. 02/20/18   McVey, Gelene Mink, PA-C  benzonatate (TESSALON) 100 MG capsule Take 1 capsule (100 mg total) by mouth every 8 (eight) hours. 10/30/19   Loura Halt A, NP  estradiol (ESTRACE) 1 MG tablet Take 1 tablet (1 mg total) by mouth daily. 02/20/18   McVey, Gelene Mink, PA-C  hydrochlorothiazide (HYDRODIURIL) 25 MG tablet Take 25 mg by mouth daily. 09/28/19   [provider]  hydrOXYzine (VISTARIL) 100 MG capsule Take 1 capsule (100 mg total) by mouth at bedtime. 02/20/18   McVey, Gelene Mink, PA-C  nitrofurantoin, macrocrystal-monohydrate, (MACROBID) 100 MG capsule Take 1 capsule (100 mg total) by mouth 2 (two) times daily. 10/30/19   Orvan July, NP    Family History Family History  Problem Relation Age of Onset  . Hypertension Mother   . Cancer Father        THROAT   . Colon cancer Neg Hx     Social History Social History   Tobacco Use  . Smoking status: Never Smoker  . Smokeless tobacco: Never Used  Substance Use Topics  . Alcohol use: Yes    Alcohol/week: 0.0 standard drinks    Comment: occ  . Drug use: No     Allergies   Patient has no known allergies.   Review of Systems Review of  Systems  Respiratory: Positive for shortness of breath.   Genitourinary: Positive for dysuria.  Musculoskeletal: Positive for back pain.     Physical Exam Triage Vital Signs ED Triage Vitals  Enc Vitals Group     BP 10/30/19 1135 136/73     Pulse Rate 10/30/19 1135 72     Resp 10/30/19 1135 18     Temp 10/30/19 1135 98.7 F (37.1 C)     Temp Source 10/30/19 1135 Oral     SpO2 10/30/19 1135 98 %     Weight --      Height --      Head Circumference --      Peak Flow --      Pain Score 10/30/19 1131 4     Pain Loc --      Pain Edu? --      Excl. in [place]? --    No data found.  Updated Vital Signs BP 136/73 (BP Location: Left Arm)   Pulse 72   Temp 98.7 F (37.1 C) (Oral)   Resp 18   LMP 08/10/2015 (Approximate) Comment: continuous bleeding/spotting for months  SpO2 98%   Visual Acuity Right Eye Distance:   Left Eye Distance:   Bilateral Distance:    Right Eye Near:   Left Eye Near:    Bilateral Near:     Physical Exam Vitals and nursing note reviewed.  Constitutional:      General: She is not in acute distress.    Appearance: Normal appearance. She is well-developed. She is not ill-appearing, toxic-appearing or diaphoretic.  HENT:     Head: Normocephalic and atraumatic.     Nose: Nose normal.     Mouth/Throat:     Pharynx: Oropharynx is clear.  Eyes:     Conjunctiva/sclera: Conjunctivae normal.  Cardiovascular:     Rate and Rhythm: Normal rate and regular rhythm.     Heart sounds: No murmur.  Pulmonary:     Effort: Pulmonary effort is normal. No respiratory distress.     Breath sounds: Normal breath sounds.  Abdominal:     Palpations: Abdomen is soft.     Tenderness: There is no abdominal tenderness.  Musculoskeletal:        General: Normal range of motion.     Cervical back: Normal range of motion and neck supple.  Skin:    General: Skin is warm and dry.     Findings: No rash.  Neurological:     Mental Status: She is alert.  Psychiatric:         Mood and Affect: Mood  normal.      UC Treatments / Results  Labs (all labs ordered are listed, but only abnormal results are displayed) Labs Reviewed  POCT URINALYSIS DIP (DEVICE) - Abnormal; Notable for the following components:      Result Value   Nitrite POSITIVE (*)    Leukocytes,Ua TRACE (*)    All other components within normal limits  URINE CULTURE    EKG   Radiology DG Chest 2 View  Result Date: 10/30/2019 CLINICAL DATA:  Shortness of breath.  COVID-19 positive EXAM: CHEST - 2 VIEW COMPARISON:  August 11, 2015 FINDINGS: There is no edema or consolidation. The heart size and pulmonary vascularity are normal. No adenopathy. No bone lesions. IMPRESSION: No edema or consolidation.  No evident adenopathy. Electronically Signed   By: Lowella Grip III M.D.   On: 10/30/2019 11:54    Procedures Procedures (including critical care time)  Medications Ordered in UC Medications - No data to display  Initial Impression / Assessment and Plan / UC Course  I have reviewed the triage vital signs and the nursing notes.  Pertinent labs & imaging results that were available during my care of the patient were reviewed by me and considered in my medical decision making (see chart for details).     UTI- urine with trace leuks and positive nitrites. Send for culture.  Will treat with Macrobid pending culture. Recommended push fluids  COVID-19-patient having mild back pain, chest discomfort and shortness of breath with coughing. Chest x-ray negative for any acute abnormalities. Vital signs stable and oxygen is 98%. Tessalon Perles for cough Follow up as needed for continued or worsening symptoms   Final Clinical Impressions(s) / UC Diagnoses   Final diagnoses:  Lower urinary tract infectious disease  COVID-19  Cough     Discharge Instructions     Your x ray was normal Tessalon pearls for cough.  Treating you for a urinary tract infection  Take the antibiotics as  prescribed.  Rest and drink plenty of water.  Follow up as needed for continued or worsening symptoms     ED Prescriptions    Medication Sig Dispense Auth. Provider   nitrofurantoin, macrocrystal-monohydrate, (MACROBID) 100 MG capsule Take 1 capsule (100 mg total) by mouth 2 (two) times daily. 10 capsule Falicia Lizotte A, NP   benzonatate (TESSALON) 100 MG capsule Take 1 capsule (100 mg total) by mouth every 8 (eight) hours. 21 capsule Deroy Noah A, NP     PDMP not reviewed this encounter.   Orvan July, NP 10/31/19 [phone]

## 2019-11-01 LAB — URINE CULTURE: Culture: 10000 — AB

## 2019-11-06 ENCOUNTER — Ambulatory Visit: Payer: BLUE CROSS/BLUE SHIELD | Attending: Internal Medicine

## 2019-11-06 ENCOUNTER — Other Ambulatory Visit: Payer: BLUE CROSS/BLUE SHIELD

## 2019-11-06 DIAGNOSIS — Z20822 Contact with and (suspected) exposure to covid-19: Secondary | ICD-10-CM

## 2019-11-07 LAB — NOVEL CORONAVIRUS, NAA: SARS-CoV-2, NAA: NOT DETECTED

## 2020-01-19 ENCOUNTER — Ambulatory Visit: Payer: Self-pay | Attending: Internal Medicine

## 2020-01-19 DIAGNOSIS — Z23 Encounter for immunization: Secondary | ICD-10-CM

## 2020-01-19 NOTE — Progress Notes (Signed)
   Covid-19 Vaccination Clinic  Name:  Stephanie Gibson    MRN: [id number] DOB: 1965/11/13  01/19/2020  [name] was observed post Covid-19 immunization for 15 minutes without incident. She was provided with Vaccine Information Sheet and instruction to access the V-Safe system.   [name] was instructed to call 911 with any severe reactions post vaccine: Marland Kitchen Difficulty breathing  . Swelling of face and throat  . A fast heartbeat  . A bad rash all over body  . Dizziness and weakness   Immunizations Administered    Name Date Dose VIS Date Route   Pfizer COVID-19 Vaccine 01/19/2020  8:30 AM 0.3 mL 10/19/2019 Intramuscular   Manufacturer: [place]   Lot: [id number]   [place]: [id number]

## 2020-02-11 ENCOUNTER — Ambulatory Visit: Payer: Self-pay

## 2020-02-11 ENCOUNTER — Ambulatory Visit: Payer: Self-pay | Attending: Internal Medicine

## 2020-02-11 DIAGNOSIS — Z23 Encounter for immunization: Secondary | ICD-10-CM

## 2020-02-11 NOTE — Progress Notes (Signed)
   Covid-19 Vaccination Clinic  Name:  Stephanie Gibson    MRN: [id number] DOB: 10/18/1966  02/11/2020  Stephanie Gibson was observed post Covid-19 immunization for 15 minutes without incident. She was provided with Vaccine Information Sheet and instruction to access the V-Safe system.   Stephanie Gibson was instructed to call 911 with any severe reactions post vaccine: Marland Kitchen Difficulty breathing  . Swelling of face and throat  . A fast heartbeat  . A bad rash all over body  . Dizziness and weakness   Immunizations Administered    Name Date Dose VIS Date Route   Pfizer COVID-19 Vaccine 02/11/2020  5:12 PM 0.3 mL 10/19/2019 Intramuscular   Manufacturer: [place]   Lot: [id number]   [place]: [id number]

## 2020-03-24 ENCOUNTER — Other Ambulatory Visit: Payer: Self-pay | Admitting: Obstetrics & Gynecology

## 2020-03-24 DIAGNOSIS — Z1231 Encounter for screening mammogram for malignant neoplasm of breast: Secondary | ICD-10-CM

## 2020-03-25 ENCOUNTER — Ambulatory Visit (INDEPENDENT_AMBULATORY_CARE_PROVIDER_SITE_OTHER): Payer: 59 | Admitting: Emergency Medicine

## 2020-03-25 ENCOUNTER — Encounter: Payer: Self-pay | Admitting: Emergency Medicine

## 2020-03-25 ENCOUNTER — Other Ambulatory Visit: Payer: Self-pay

## 2020-03-25 VITALS — BP 133/83 | HR 65 | Temp 98.2°F | Resp 16 | Ht 63.25 in | Wt 182.0 lb

## 2020-03-25 DIAGNOSIS — N390 Urinary tract infection, site not specified: Secondary | ICD-10-CM

## 2020-03-25 DIAGNOSIS — I10 Essential (primary) hypertension: Secondary | ICD-10-CM | POA: Diagnosis not present

## 2020-03-25 DIAGNOSIS — N951 Menopausal and female climacteric states: Secondary | ICD-10-CM

## 2020-03-25 DIAGNOSIS — R350 Frequency of micturition: Secondary | ICD-10-CM | POA: Diagnosis not present

## 2020-03-25 LAB — POC MICROSCOPIC URINALYSIS (UMFC): Mucus: ABSENT

## 2020-03-25 LAB — POCT URINALYSIS DIP (MANUAL ENTRY)
Bilirubin, UA: NEGATIVE
Glucose, UA: NEGATIVE mg/dL
Ketones, POC UA: NEGATIVE mg/dL
Nitrite, UA: NEGATIVE
Protein Ur, POC: NEGATIVE mg/dL
Spec Grav, UA: 1.02 (ref 1.010–1.025)
Urobilinogen, UA: 0.2 E.U./dL
pH, UA: 7 (ref 5.0–8.0)

## 2020-03-25 MED ORDER — HYDROCHLOROTHIAZIDE 25 MG PO TABS: 25.0000 mg | ORAL_TABLET | Freq: Every day | ORAL | 3 refills | Status: DC

## 2020-03-25 MED ORDER — ESTRADIOL 1 MG PO TABS: 1.0000 mg | ORAL_TABLET | Freq: Every day | ORAL | 3 refills | Status: DC

## 2020-03-25 MED ORDER — CEFUROXIME AXETIL 500 MG PO TABS: 500.0000 mg | ORAL_TABLET | Freq: Two times a day (BID) | ORAL | 0 refills | Status: AC

## 2020-03-25 MED ORDER — AMLODIPINE BESYLATE 5 MG PO TABS: 5.0000 mg | ORAL_TABLET | Freq: Every day | ORAL | 3 refills | Status: DC

## 2020-03-25 NOTE — Progress Notes (Signed)
Stephanie Gibson 54 y.o.   Chief Complaint  Patient presents with  . transfer of care  . Urinary Frequency    with pain in lower back for 3 weeks and burning     HISTORY OF PRESENT ILLNESS: This is a 53 y.o. female first visit with me complaining of urinary frequency that started 4 weeks ago.  Took leftover nitrofurantoin medication for about 1 week with some relief but symptoms are back now.  Complaining of burning on urination and urinary frequency.  Also complaining of lower bilateral lumbar pain.  Denies fever or chills.  Denies nausea or vomiting.  Denies hematuria.  Denies abdominal pain. Has history of hypertension on amlodipine and hydrochlorothiazide. Has history of total abdominal hysterectomy in 2016 by Dr. Toney Rakes.  Has been taking estradiol 1 mg daily since.  Needs to follow-up with their office. Fully vaccinated against Covid. No other complaints or medical concerns today.  HPI   Prior to Admission medications   Medication Sig Start Date End Date Taking? Authorizing Provider  albuterol (VENTOLIN HFA) 108 (90 Base) MCG/ACT inhaler Inhale into the lungs. 02/16/19  Yes [provider]  amLODipine (NORVASC) 5 MG tablet Take 1 tablet (5 mg total) by mouth daily. 02/20/18  Yes McVey, Gelene Mink, PA-C  estradiol (ESTRACE) 1 MG tablet Take 1 tablet (1 mg total) by mouth daily. 02/20/18  Yes McVey, Gelene Mink, PA-C  hydrochlorothiazide (HYDRODIURIL) 25 MG tablet Take 25 mg by mouth daily. 09/28/19  Yes [provider]  hydrOXYzine (VISTARIL) 100 MG capsule Take 1 capsule (100 mg total) by mouth at bedtime. 02/20/18  Yes McVey, Gelene Mink, PA-C  nitrofurantoin, macrocrystal-monohydrate, (MACROBID) 100 MG capsule Take 1 capsule (100 mg total) by mouth 2 (two) times daily. 10/30/19  Yes Orvan July, NP    No Known Allergies  Patient Active Problem List   Diagnosis Date Noted  . Menopausal symptom 05/28/2017  . Mild asthma without  complication [id number]  . Intramural leiomyoma of uterus 05/03/2014  . Overweight(278.02) 04/12/2014  . HTN (hypertension) 04/12/2014    Past Medical History:  Diagnosis Date  . Anemia   . Ectopic pregnancy   . Essential hypertension   . Leiomyoma of uterus   . Pneumonia   . Shortness of breath dyspnea     Past Surgical History:  Procedure Laterality Date  . CESAREAN SECTION    . CYSTOSCOPY N/A 10/21/2015   Procedure: CYSTOSCOPY;  Surgeon: Terrance Mass, MD;  Location: [facility];  Service: Gynecology;  Laterality: N/A;  . LAPAROSCOPIC VAGINAL HYSTERECTOMY WITH SALPINGO OOPHORECTOMY N/A 10/21/2015   Procedure: LAPAROSCOPIC ASSISTED VAGINAL HYSTERECTOMY;  Surgeon: Terrance Mass, MD;  Location: [facility];  Service: Gynecology;  Laterality: N/A;  . OOPHORECTOMY    . SALPINGOOPHORECTOMY Left 10/21/2015   Procedure: SALPINGO OOPHORECTOMY;  Surgeon: Terrance Mass, MD;  Location: [facility];  Service: Gynecology;  Laterality: Left;  . TUBAL LIGATION      Social History   Socioeconomic History  . Marital status: Married    Spouse name: Not on file  . Number of children: Not on file  . Years of education: Not on file  . Highest education level: Not on file  Occupational History  . Not on file  Tobacco Use  . Smoking status: Never Smoker  . Smokeless tobacco: Never Used  Substance and Sexual Activity  . Alcohol use: Yes    Alcohol/week: 0.0 standard drinks    Comment: occ  . Drug use: No  .  Sexual activity: Yes    Birth control/protection: None    Comment: INTERCOURSE AGE 74, SEXUAL PARTNERS LESS THAN 5, current partner-  22 yrs   Other Topics Concern  . Not on file  Social History Narrative  . Not on file   Social Determinants of Health   Financial Resource Strain:   . Difficulty of Paying Living Expenses:   Food Insecurity:   . Worried About Charity fundraiser in the Last Year:   . Arboriculturist in the Last Year:   Transportation Needs:   . Lexicographer (Medical):   Marland Kitchen Lack of Transportation (Non-Medical):   Physical Activity:   . Days of Exercise per Week:   . Minutes of Exercise per Session:   Stress:   . Feeling of Stress :   Social Connections:   . Frequency of Communication with Friends and Family:   . Frequency of Social Gatherings with Friends and Family:   . Attends Religious Services:   . Active Member of Clubs or Organizations:   . Attends Archivist Meetings:   Marland Kitchen Marital Status:   Intimate Partner Violence:   . Fear of Current or Ex-Partner:   . Emotionally Abused:   Marland Kitchen Physically Abused:   . Sexually Abused:     Family History  Problem Relation Age of Onset  . Hypertension Mother   . Cancer Father        THROAT   . Colon cancer Neg Hx      Review of Systems  Constitutional: Negative.  Negative for chills and fever.  HENT: Negative.  Negative for congestion and sore throat.   Respiratory: Negative.  Negative for cough and shortness of breath.   Cardiovascular: Negative.  Negative for chest pain and palpitations.  Gastrointestinal: Negative.  Negative for abdominal pain, blood in stool, diarrhea, melena, nausea and vomiting.  Genitourinary: Positive for dysuria, frequency and urgency. Negative for flank pain and hematuria.  Musculoskeletal: Positive for back pain.  Skin: Negative.  Negative for rash.  Neurological: Negative.  Negative for dizziness and headaches.  All other systems reviewed and are negative.  Today's Vitals   03/25/20 0930  BP: 133/83  Pulse: 65  Resp: 16  Temp: 98.2 F (36.8 C)  TempSrc: Temporal  SpO2: 98%  Weight: 182 lb (82.6 kg)  Height: 5' 3.25" (1.607 m)   Body mass index is 31.99 kg/m.   Physical Exam Vitals reviewed.  Constitutional:      Appearance: Normal appearance.  HENT:     Head: Normocephalic.  Eyes:     Extraocular Movements: Extraocular movements intact.     Conjunctiva/sclera: Conjunctivae normal.     Pupils: Pupils are equal,  round, and reactive to light.  Cardiovascular:     Rate and Rhythm: Normal rate and regular rhythm.     Pulses: Normal pulses.     Heart sounds: Normal heart sounds.  Pulmonary:     Effort: Pulmonary effort is normal.     Breath sounds: Normal breath sounds.  Abdominal:     General: There is no distension.     Palpations: Abdomen is soft.     Tenderness: There is no abdominal tenderness.  Musculoskeletal:     Cervical back: Normal range of motion and neck supple.     Right lower leg: No edema.     Left lower leg: No edema.  Skin:    General: Skin is warm and dry.     Capillary  Refill: Capillary refill takes less than 2 seconds.  Neurological:     General: No focal deficit present.     Mental Status: She is alert and oriented to person, place, and time.  Psychiatric:        Mood and Affect: Mood normal.        Behavior: Behavior normal.    Results for orders placed or performed in visit on 03/25/20 (from the past 24 hour(s))  POCT urinalysis dipstick     Status: Abnormal   Collection Time: 03/25/20 10:07 AM  Result Value Ref Range   Color, UA yellow yellow   Clarity, UA clear clear   Glucose, UA negative negative mg/dL   Bilirubin, UA negative negative   Ketones, POC UA negative negative mg/dL   Spec Grav, UA 1.020 1.010 - 1.025   Blood, UA trace-intact (A) negative   pH, UA 7.0 5.0 - 8.0   Protein Ur, POC negative negative mg/dL   Urobilinogen, UA 0.2 0.2 or 1.0 E.U./dL   Nitrite, UA Negative Negative   Leukocytes, UA Small (1+) (A) Negative  POCT Microscopic Urinalysis ([facility])     Status: Abnormal   Collection Time: 03/25/20 10:07 AM  Result Value Ref Range   WBC,UR,HPF,POC Moderate (A) None WBC/hpf   RBC,UR,HPF,POC None None RBC/hpf   Bacteria Few (A) None, Too numerous to count   Mucus Absent Absent   Epithelial Cells, UR Per Microscopy Few (A) None, Too numerous to count cells/hpf   A total of 30 minutes was spent with the patient, greater than 50% of which was  in counseling/coordination of care regarding hypertension and cardiovascular risks associated with this condition, diagnosis and treatment of UTI, review of all medications including today's antibiotic Ceftin, review of most recent office visit notes, review of most recent blood work results, establishing care, review of past medical history including total hysterectomy and menopausal symptoms with HRT, prognosis, need for follow-up with her GYN doctor, and follow-up here.   ASSESSMENT & PLAN:  Stephanie Gibson was seen today for transfer of care, urinary frequency and medication refill.  Diagnoses and all orders for this visit:  Acute UTI -     Urine Culture -     cefUROXime (CEFTIN) 500 MG tablet; Take 1 tablet (500 mg total) by mouth 2 (two) times daily with a meal for 7 days.  Urinary frequency -     Hemoglobin A1c -     POCT urinalysis dipstick -     POCT Microscopic Urinalysis ([facility])  Essential hypertension -     Comprehensive metabolic panel -     Lipid panel -     amLODipine (NORVASC) 5 MG tablet; Take 1 tablet (5 mg total) by mouth daily. -     hydrochlorothiazide (HYDRODIURIL) 25 MG tablet; Take 1 tablet (25 mg total) by mouth daily.  Menopausal symptom -     estradiol (ESTRACE) 1 MG tablet; Take 1 tablet (1 mg total) by mouth daily.    Patient Instructions       If you have lab work done today you will be contacted with your lab results within the next 2 weeks.  If you have not heard from [place] then please contact us. The fastest way to get your results is to register for My Chart.   IF you received an x-ray today, you will receive an invoice from [facility]. Please contact [facility] at [phone] with questions or concerns regarding your invoice.   IF you received labwork today,  you will receive an invoice from [facility]. Please contact [facility] at [phone] with questions or concerns regarding your invoice.   Our billing staff will not be able to  assist you with questions regarding bills from these companies.  You will be contacted with the lab results as soon as they are available. The fastest way to get your results is to activate your My Chart account. Instructions are located on the last page of this paperwork. If you have not heard from [place] regarding the results in 2 weeks, please contact this office.     Infeccin urinaria en los adultos Urinary Tract Infection, Adult Una infeccin urinaria (IU) puede ocurrir en Clinical cytogeneticist de las vas [place]. Las vas urinarias incluyen lo siguiente:  Los riones.  Los urteres.  La vejiga.  La uretra. Estos rganos fabrican, almacenan y eliminan el pis (orina) del cuerpo. Cules son las causas? La causa es la presencia de grmenes (bacterias) en la zona genital. Estos grmenes proliferan y causan hinchazn (inflamacin) de las vas urinarias. Qu incrementa el riesgo? Es ms probable que contraiga esta afeccin si:  Tiene colocado un tubo delgado y pequeo (catter) para drenar el pis.  Nopuede controlar la evacuacin de pis ni de materia fecal (incontinencia).  Es Art therapist y, adems: ? [place] estos mtodos para Therapist, occupational:  Un medicamento que [employer] espermatozoides (espermicida).  Un dispositivo que impide el paso de los espermatozoides (diafragma). ? Tieneniveles bajos de una hormona femenina (estrgeno). ? Est embarazada.  Tiene genes que [employer].  Es sexualmente activa.  Toma antibiticos.  Tiene dificultad para orinar debido a: ? Su prstata es ms grande de lo normal, si usted es hombre. ? Obstruccin en la parte del cuerpo que drena el pis de la vejiga (uretra). ? Clculo renal. ? Untrastorno nervioso que afecta la vejiga (vejiga neurgena). ? No bebe una cantidad suficiente de lquido. ? No hace pis con la frecuencia suficiente.  Tiene otras afecciones, como: ? Diabetes. ? Un sistema que combate las enfermedades (sistema  inmunitario) debilitado. ? Anemia drepanoctica. ? Gota. ? Lesin en la columna vertebral. Cules son los signos o los sntomas? Los sntomas de esta afeccin incluyen:  Necesidad inmediata (urgente) de hacer pis.  Hacer pis con frecuencia.  Hacer poca cantidad de pis con mucha frecuencia.  Dolor o ardor al [employer].  Sangre en el pis.  Pis que huele mal o anormal.  Dificultad para hacer pis.  Pis turbio.  Lquido que sale de la vagina, si es [place].  Dolor en la barriga o en la parte baja de la espalda. Otros sntomas pueden incluir los siguientes:  Vmitos.  No sentir deseos de comer.  Sentirse confundido (confuso).  Sentirse cansado y malhumorado (irritable).  Cristy Hilts.  Materia fecal lquida (diarrea). Cmo se trata? El tratamiento de esta afeccin puede incluir:  Antibiticos.  Otros medicamentos.  Beber una cantidad suficiente de agua. Sigue estas instrucciones en tu casa:  Medicamentos  [employer] de venta libre y los recetados solamente como se lo haya indicado el mdico.  Si le recetaron un antibitico, tmelo como se lo haya indicado el mdico. No deje de tomarlo aunque comience a sentirse mejor. Indicaciones generales  Asegrese de hacer lo siguiente: ? Haga pis hasta que la vejiga quede vaca. ? Nocontenga el pis durante mucho tiempo. ? Vace la vejiga despus de Clinical biochemist. ? Lmpiese de adelante hacia atrs despus de defecar, si es mujer. Use cada trozo de papel higinico  solo una vez cuando se limpie.  Beba suficiente lquido como para Theatre manager la orina de color amarillo plido.  Concurra a todas las visitas de seguimiento como se lo haya indicado el mdico. Esto es importante. Comunquese con un mdico si:  No mejora despus de 1 o 2das de tratamiento.  Los sntomas desaparecen y Teacher, adult education. Solicite ayuda inmediatamente si:  Tiene un dolor muy intenso en la espalda.  Tiene dolor muy intenso  en la parte baja de la barriga.  Tener fiebre.  Tiene Higher education careers adviser (nuseas).  Tiene vmitos. Resumen  Una infeccin urinaria (IU) puede ocurrir en Clinical cytogeneticist de las vas [place].  Esta afeccin es causada por la presencia de grmenes en la zona genital.  Existen muchos factores de riesgo de sufrir una IU. Estos incluyen tener colocado un tubo delgado y pequeo para drenar el pis y no poder controlar cundo hace pis y materia fecal.  El tratamiento incluye antibiticos contra los grmenes.  Beba suficiente lquido como para Theatre manager la orina de color amarillo plido. Esta informacin no tiene Marine scientist el consejo del mdico. Asegrese de hacerle al mdico cualquier pregunta que tenga. Document Revised: 10/18/2018 Document Reviewed: 10/18/2018 Elsevier Patient Education  2020 Elsevier Inc.     Agustina Caroli, MD [facility]

## 2020-03-25 NOTE — Patient Instructions (Addendum)
   If you have lab work done today you will be contacted with your lab results within the next 2 weeks.  If you have not heard from us then please contact us. The fastest way to get your results is to register for My Chart.   IF you received an x-ray today, you will receive an invoice from [facility]. Please contact  Radiology at [phone] with questions or concerns regarding your invoice.   IF you received labwork today, you will receive an invoice from [facility]. Please contact [facility] at [phone] with questions or concerns regarding your invoice.   Our billing staff will not be able to assist you with questions regarding bills from these companies.  You will be contacted with the lab results as soon as they are available. The fastest way to get your results is to activate your My Chart account. Instructions are located on the last page of this paperwork. If you have not heard from us regarding the results in 2 weeks, please contact this office.      Infeccin urinaria en los adultos Urinary Tract Infection, Adult Una infeccin urinaria (IU) puede ocurrir en cualquier lugar de las vas urinarias. Las vas urinarias incluyen lo siguiente:  Los riones.  Los urteres.  La vejiga.  La uretra. Estos rganos fabrican, almacenan y eliminan el pis (orina) del cuerpo. Cules son las causas? La causa es la presencia de grmenes (bacterias) en la zona genital. Estos grmenes proliferan y causan hinchazn (inflamacin) de las vas urinarias. Qu incrementa el riesgo? Es ms probable que contraiga esta afeccin si:  Tiene colocado un tubo delgado y pequeo (catter) para drenar el pis.  Nopuede controlar la evacuacin de pis ni de materia fecal (incontinencia).  Es mujer y, adems: ? [place] estos mtodos para evitar el embarazo:  Un medicamento que mata los espermatozoides (espermicida).  Un dispositivo que impide el paso de los espermatozoides  (diafragma). ? Tieneniveles bajos de una hormona femenina (estrgeno). ? Est embarazada.  Tiene genes que aumentan su riesgo.  Es sexualmente activa.  Toma antibiticos.  Tiene dificultad para orinar debido a: ? Su prstata es ms grande de lo normal, si usted es hombre. ? Obstruccin en la parte del cuerpo que drena el pis de la vejiga (uretra). ? Clculo renal. ? Untrastorno nervioso que afecta la vejiga (vejiga neurgena). ? No bebe una cantidad suficiente de lquido. ? No hace pis con la frecuencia suficiente.  Tiene otras afecciones, como: ? Diabetes. ? Un sistema que combate las enfermedades (sistema inmunitario) debilitado. ? Anemia drepanoctica. ? Gota. ? Lesin en la columna vertebral. Cules son los signos o los sntomas? Los sntomas de esta afeccin incluyen:  Necesidad inmediata (urgente) de hacer pis.  Hacer pis con frecuencia.  Hacer poca cantidad de pis con mucha frecuencia.  Dolor o ardor al hacer pis.  Sangre en el pis.  Pis que huele mal o anormal.  Dificultad para hacer pis.  Pis turbio.  Lquido que sale de la vagina, si es mujer.  Dolor en la barriga o en la parte baja de la espalda. Otros sntomas pueden incluir los siguientes:  Vmitos.  No sentir deseos de comer.  Sentirse confundido (confuso).  Sentirse cansado y malhumorado (irritable).  Fiebre.  Materia fecal lquida (diarrea). Cmo se trata? El tratamiento de esta afeccin puede incluir:  Antibiticos.  Otros medicamentos.  Beber una cantidad suficiente de agua. Sigue estas instrucciones en tu casa:  Medicamentos  Tome los medicamentos de venta libre y   los recetados solamente como se lo haya indicado el mdico.  Si le recetaron un antibitico, tmelo como se lo haya indicado el mdico. No deje de tomarlo aunque comience a sentirse mejor. Indicaciones generales  Asegrese de hacer lo siguiente: ? Haga pis hasta que la vejiga quede vaca. ? Nocontenga el  pis durante mucho tiempo. ? Vace la vejiga despus de tener relaciones sexuales. ? Lmpiese de adelante hacia atrs despus de defecar, si es mujer. Use cada trozo de papel higinico solo una vez cuando se limpie.  Beba suficiente lquido como para mantener la orina de color amarillo plido.  Concurra a todas las visitas de seguimiento como se lo haya indicado el mdico. Esto es importante. Comunquese con un mdico si:  No mejora despus de 1 o 2das de tratamiento.  Los sntomas desaparecen y luego reaparecen. Solicite ayuda inmediatamente si:  Tiene un dolor muy intenso en la espalda.  Tiene dolor muy intenso en la parte baja de la barriga.  Tener fiebre.  Tiene malestar estomacal (nuseas).  Tiene vmitos. Resumen  Una infeccin urinaria (IU) puede ocurrir en cualquier lugar de las vas urinarias.  Esta afeccin es causada por la presencia de grmenes en la zona genital.  Existen muchos factores de riesgo de sufrir una IU. Estos incluyen tener colocado un tubo delgado y pequeo para drenar el pis y no poder controlar cundo hace pis y materia fecal.  El tratamiento incluye antibiticos contra los grmenes.  Beba suficiente lquido como para mantener la orina de color amarillo plido. Esta informacin no tiene como fin reemplazar el consejo del mdico. Asegrese de hacerle al mdico cualquier pregunta que tenga. Document Revised: 10/18/2018 Document Reviewed: 10/18/2018 Elsevier Patient Education  2020 Elsevier Inc.  

## 2020-03-26 LAB — COMPREHENSIVE METABOLIC PANEL
ALT: 26 IU/L (ref 0–32)
AST: 20 IU/L (ref 0–40)
Albumin/Globulin Ratio: 1.5 (ref 1.2–2.2)
Albumin: 4.3 g/dL (ref 3.8–4.9)
Alkaline Phosphatase: 98 IU/L (ref 48–121)
BUN/Creatinine Ratio: 21 (ref 9–23)
BUN: 14 mg/dL (ref 6–24)
Bilirubin Total: 0.3 mg/dL (ref 0.0–1.2)
CO2: 24 mmol/L (ref 20–29)
Calcium: 9.5 mg/dL (ref 8.7–10.2)
Chloride: 101 mmol/L (ref 96–106)
Creatinine, Ser: 0.67 mg/dL (ref 0.57–1.00)
GFR calc Af Amer: 114 mL/min/{1.73_m2} (ref >59)
GFR calc non Af Amer: 99 mL/min/{1.73_m2} (ref >59)
Globulin, Total: 2.8 g/dL (ref 1.5–4.5)
Glucose: 104 mg/dL — ABNORMAL HIGH (ref 65–99)
Potassium: 3.8 mmol/L (ref 3.5–5.2)
Sodium: 140 mmol/L (ref 134–144)
Total Protein: 7.1 g/dL (ref 6.0–8.5)

## 2020-03-26 LAB — LIPID PANEL
Chol/HDL Ratio: 3 ratio (ref 0.0–4.4)
Cholesterol, Total: 183 mg/dL (ref 100–199)
HDL: 61 mg/dL (ref >39)
LDL Chol Calc (NIH): 108 mg/dL — ABNORMAL HIGH (ref 0–99)
Triglycerides: 76 mg/dL (ref 0–149)
VLDL Cholesterol Cal: 14 mg/dL (ref 5–40)

## 2020-03-26 LAB — HEMOGLOBIN A1C
Est. average glucose Bld gHb Est-mCnc: 120 mg/dL
Hgb A1c MFr Bld: 5.8 % — ABNORMAL HIGH (ref 4.8–5.6)

## 2020-03-27 ENCOUNTER — Telehealth: Payer: Self-pay | Admitting: Emergency Medicine

## 2020-03-27 LAB — URINE CULTURE

## 2020-03-27 NOTE — Telephone Encounter (Signed)
Spoke to patient about blood results and urine culture.

## 2020-04-25 ENCOUNTER — Ambulatory Visit: Payer: BLUE CROSS/BLUE SHIELD

## 2020-05-09 ENCOUNTER — Other Ambulatory Visit: Payer: Self-pay

## 2020-05-09 ENCOUNTER — Ambulatory Visit
Admission: RE | Admit: 2020-05-09 | Discharge: 2020-05-09 | Disposition: A | Discharge status: Home / Self Care | Payer: 59 | Source: Ambulatory Visit | Attending: Obstetrics & Gynecology | Admitting: Obstetrics & Gynecology

## 2020-05-09 DIAGNOSIS — Z1231 Encounter for screening mammogram for malignant neoplasm of breast: Secondary | ICD-10-CM

## 2020-07-28 ENCOUNTER — Encounter: Payer: Self-pay | Admitting: Emergency Medicine

## 2020-07-28 ENCOUNTER — Other Ambulatory Visit: Payer: Self-pay

## 2020-07-28 ENCOUNTER — Ambulatory Visit (INDEPENDENT_AMBULATORY_CARE_PROVIDER_SITE_OTHER): Payer: 59 | Admitting: Emergency Medicine

## 2020-07-28 VITALS — BP 160/80 | HR 69 | Temp 98.2°F | Ht 63.0 in | Wt 190.8 lb

## 2020-07-28 DIAGNOSIS — M2550 Pain in unspecified joint: Secondary | ICD-10-CM

## 2020-07-28 DIAGNOSIS — M8949 Other hypertrophic osteoarthropathy, multiple sites: Secondary | ICD-10-CM | POA: Diagnosis not present

## 2020-07-28 DIAGNOSIS — M159 Polyosteoarthritis, unspecified: Secondary | ICD-10-CM

## 2020-07-28 DIAGNOSIS — K219 Gastro-esophageal reflux disease without esophagitis: Secondary | ICD-10-CM | POA: Diagnosis not present

## 2020-07-28 MED ORDER — PANTOPRAZOLE SODIUM 40 MG PO TBEC: 40.0000 mg | DELAYED_RELEASE_TABLET | Freq: Every day | ORAL | 1 refills | Status: DC

## 2020-07-28 MED ORDER — DICLOFENAC SODIUM 75 MG PO TBEC: 75.0000 mg | DELAYED_RELEASE_TABLET | Freq: Two times a day (BID) | ORAL | 1 refills | Status: DC | PRN

## 2020-07-28 NOTE — Progress Notes (Addendum)
Adah Torres-Herrera 54 y.o.   Chief Complaint  Patient presents with  . Generalized Body Aches    on her back, in both hands and knees   . Arthritis    For last couple of months.     HISTORY OF PRESENT ILLNESS: This is a 54 y.o. female complaining of pain to several joints in both hands, knees, elbows, back and shoulders for several months. No other associated symptomatology No other complaints or medical concerns today.  HPI   Prior to Admission medications   Medication Sig Start Date End Date Taking? Authorizing Provider  albuterol (VENTOLIN HFA) 108 (90 Base) MCG/ACT inhaler Inhale into the lungs. 02/16/19  Yes [provider]  amLODipine (NORVASC) 5 MG tablet Take 1 tablet (5 mg total) by mouth daily. 03/25/20  Yes Matthews Franks, Ines Bloomer, MD  estradiol (ESTRACE) 1 MG tablet Take 1 tablet (1 mg total) by mouth daily. 03/25/20  Yes Carla Rashad, Ines Bloomer, MD  hydrochlorothiazide (HYDRODIURIL) 25 MG tablet Take 1 tablet (25 mg total) by mouth daily. 03/25/20  Yes Aquil Duhe, Ines Bloomer, MD  hydrOXYzine (VISTARIL) 100 MG capsule Take 1 capsule (100 mg total) by mouth at bedtime. 02/20/18  Yes McVey, Gelene Mink, PA-C    No Known Allergies  Patient Active Problem List   Diagnosis Date Noted  . Menopausal symptom 05/28/2017  . Mild asthma without complication 90/24/0973  . Intramural leiomyoma of uterus 05/03/2014  . Overweight(278.02) 04/12/2014  . HTN (hypertension) 04/12/2014    Past Medical History:  Diagnosis Date  . Anemia   . Ectopic pregnancy   . Essential hypertension   . Leiomyoma of uterus   . Pneumonia   . Shortness of breath dyspnea     Past Surgical History:  Procedure Laterality Date  . CESAREAN SECTION    . CYSTOSCOPY N/A 10/21/2015   Procedure: CYSTOSCOPY;  Surgeon: Terrance Mass, MD;  Location: [facility];  Service: Gynecology;  Laterality: N/A;  . LAPAROSCOPIC VAGINAL HYSTERECTOMY WITH SALPINGO OOPHORECTOMY N/A 10/21/2015   Procedure:  LAPAROSCOPIC ASSISTED VAGINAL HYSTERECTOMY;  Surgeon: Terrance Mass, MD;  Location: [facility];  Service: Gynecology;  Laterality: N/A;  . OOPHORECTOMY    . SALPINGOOPHORECTOMY Left 10/21/2015   Procedure: SALPINGO OOPHORECTOMY;  Surgeon: Terrance Mass, MD;  Location: [facility];  Service: Gynecology;  Laterality: Left;  . TUBAL LIGATION      Social History   Socioeconomic History  . Marital status: Married    Spouse name: Not on file  . Number of children: Not on file  . Years of education: Not on file  . Highest education level: Not on file  Occupational History  . Not on file  Tobacco Use  . Smoking status: Never Smoker  . Smokeless tobacco: Never Used  Vaping Use  . Vaping Use: Never used  Substance and Sexual Activity  . Alcohol use: Yes    Alcohol/week: 0.0 standard drinks    Comment: occ  . Drug use: No  . Sexual activity: Yes    Birth control/protection: None    Comment: INTERCOURSE AGE 41, SEXUAL PARTNERS LESS THAN 5, current partner-  22 yrs   Other Topics Concern  . Not on file  Social History Narrative  . Not on file   Social Determinants of Health   Financial Resource Strain:   . Difficulty of Paying Living Expenses: Not on file  Food Insecurity:   . Worried About Charity fundraiser in the Last Year: Not on file  .  Ran Out of Food in the Last Year: Not on file  Transportation Needs:   . Lack of Transportation (Medical): Not on file  . Lack of Transportation (Non-Medical): Not on file  Physical Activity:   . Days of Exercise per Week: Not on file  . Minutes of Exercise per Session: Not on file  Stress:   . Feeling of Stress : Not on file  Social Connections:   . Frequency of Communication with Friends and Family: Not on file  . Frequency of Social Gatherings with Friends and Family: Not on file  . Attends Religious Services: Not on file  . Active Member of Clubs or Organizations: Not on file  . Attends Archivist Meetings: Not on file  .  Marital Status: Not on file  Intimate Partner Violence:   . Fear of Current or Ex-Partner: Not on file  . Emotionally Abused: Not on file  . Physically Abused: Not on file  . Sexually Abused: Not on file    Family History  Problem Relation Age of Onset  . Hypertension Mother   . Cancer Father        THROAT   . Colon cancer Neg Hx      Review of Systems  Constitutional: Negative.  Negative for chills and fever.  HENT: Negative.  Negative for congestion and sore throat.   Respiratory: Negative.  Negative for cough and shortness of breath.   Cardiovascular: Negative.  Negative for chest pain and palpitations.  Gastrointestinal: Positive for heartburn. Negative for abdominal pain, diarrhea, nausea and vomiting.  Genitourinary: Negative.  Negative for dysuria and hematuria.  Musculoskeletal: Positive for joint pain.  Skin: Negative.  Negative for rash.  Neurological: Negative.  Negative for dizziness and headaches.  All other systems reviewed and are negative.  Today's Vitals   07/28/20 1531 07/28/20 1538  BP: (!) 168/78 (!) 160/80  Pulse: 69   Temp: 98.2 F (36.8 C)   TempSrc: Temporal   SpO2: 97%   Weight: 190 lb 12.8 oz (86.5 kg)   Height: 5\' 3"  (1.6 m)    Body mass index is 33.8 kg/m.   Physical Exam Vitals reviewed.  Constitutional:      Appearance: Normal appearance.  HENT:     Head: Normocephalic.  Eyes:     Extraocular Movements: Extraocular movements intact.     Conjunctiva/sclera: Conjunctivae normal.     Pupils: Pupils are equal, round, and reactive to light.  Cardiovascular:     Rate and Rhythm: Normal rate and regular rhythm.     Pulses: Normal pulses.     Heart sounds: Normal heart sounds.  Pulmonary:     Effort: Pulmonary effort is normal.     Breath sounds: Normal breath sounds.  Musculoskeletal:        General: Swelling, tenderness and deformity present.     Cervical back: Normal range of motion and neck supple.     Comments: Several joints  in both hands  Skin:    General: Skin is warm and dry.     Capillary Refill: Capillary refill takes less than 2 seconds.  Neurological:     General: No focal deficit present.     Mental Status: She is alert and oriented to person, place, and time.  Psychiatric:        Mood and Affect: Mood normal.        Behavior: Behavior normal.      ASSESSMENT & PLAN: Orli was seen today for generalized body  aches and arthritis.  Diagnoses and all orders for this visit:  Primary osteoarthritis involving multiple joints -     Ambulatory referral to Rheumatology -     diclofenac (VOLTAREN) 75 MG EC tablet; Take 1 tablet (75 mg total) by mouth 2 (two) times daily as needed.  Arthralgia of multiple joints  Gastroesophageal reflux disease without esophagitis -     pantoprazole (PROTONIX) 40 MG tablet; Take 1 tablet (40 mg total) by mouth daily.     Patient Instructions       If you have lab work done today you will be contacted with your lab results within the next 2 weeks.  If you have not heard from [place] then please contact us. The fastest way to get your results is to register for My Chart.   IF you received an x-ray today, you will receive an invoice from [facility]. Please contact [facility] at [phone] with questions or concerns regarding your invoice.   IF you received labwork today, you will receive an invoice from [place]. Please contact [facility] at [phone] with questions or concerns regarding your invoice.   Our billing staff will not be able to assist you with questions regarding bills from these companies.  You will be contacted with the lab results as soon as they are available. The fastest way to get your results is to activate your My Chart account. Instructions are located on the last page of this paperwork. If you have not heard from [place] regarding the results in 2 weeks, please contact this office.      Artrosis Osteoarthritis  La  artrosis es un tipo de artritis que afecta el tejido que cubre los extremos de los huesos en las articulaciones (cartlago). El cartlago acta como amortiguador [employer] y los ayuda a moverse con suavidad. La artrosis se produce cuando el cartlago de las articulaciones se gasta. A veces, la artrosis se denomina artritis "por uso y desgaste". La artrosis es la forma ms frecuente de artritis. A menudo, afecta a las [employer]. Es una enfermedad que empeora con el tiempo (una enfermedad progresiva). Esta enfermedad afecta con ms frecuencia las articulaciones de:  Los dedos de [employer]ott.  Los dedos Kellogg.  La cadera.  Las rodillas.  La columna vertebral, incluido el cuello y la zona lumbar. Cules son las causas? El desgaste del cartlago que cubre los extremos de los huesos relacionado con la edad, causa esta afeccin. Qu incrementa el riesgo? Los siguientes factores pueden hacer que usted sea ms propenso a Best boy esta afeccin:  Edad avanzada.  Tener exceso de [place] u obesidad.  Uso excesivo de las articulaciones, como en el caso de los [place].  Lesin pasada de Insurance claims handler.  Ciruga pasada en una articulacin.  Antecedentes familiares de artrosis. Cules son los signos o los sntomas? Los principales sntomas de esta enfermedad son dolor, hinchazn y Advertising account executive. Con el tiempo, la articulacin puede perder su forma. Pequeos trozos de [employer] o Biomedical scientist y flotar dentro de la articulacin, lo cual puede causar ms dolor y Agricultural consultant. Pueden formarse pequeos depsitos de hueso (ostefitos) en los extremos de Water engineer. Otros sntomas pueden incluir lo siguiente:  Una sensacin de chirrido o raspado dentro de la articulacin al moverla.  Sonidos de chasquido o crujido al [employer]. Los sntomas pueden afectar una o ms articulaciones. La artrosis en una articulacin principal, como la rodilla o la  cadera, [place]  causar dolor al caminar o al realizar ejercicio. Si tiene artrosis IAC[employer]InterActiveCorp, es posible que no pueda agarrar objetos, torcer la mano o controlar pequeos movimientos de las manos y los dedos (motricidad fina). Cmo se diagnostica? Esta afeccin se puede diagnosticar en funcin de lo siguiente:  Sus antecedentes mdicos.  Un examen fsico.  Sus sntomas.  Radiografas de la(s) articulacin(es) afectada(s).  Anlisis de sangre para descartar otros tipos de artritis. Cmo se trata? No hay cura para esta enfermedad, pero el tratamiento puede ayudar a Financial controller y Teacher, English as a foreign language el funcionamiento de Water engineer. Los planes de tratamiento pueden incluir:  Un programa de ejercicio indicado que permita el descanso y el alivio de la articulacin. Puede trabajar con un fisioterapeuta.  Un plan de control del peso.  Tcnicas de [employer]ident, como: ? Aplicacin de calor y fro en la articulacin. ? Impulsos elctricos aplicados a las terminaciones nerviosas que se encuentran debajo de la piel (estimulacin nerviosa elctrica transcutnea o TENS). ? Masajes. ? Ciertos suplementos nutricionales.  Antiinflamatorios no esteroideos (AINE) o medicamentos recetados para ayudar a Best boy.  Medicamentos para ayudar a Best boy y la inflamacin (corticoesteroides). Estos se pueden administrar por boca (va oral) o mediante una inyeccin.  Dispositivos de [place], como un dispositivo ortopdico, una frula, un guante especial o un bastn.  Ciruga, como: ? Imelda Pillow. Se hace para volver a posicionar los [employer] Services y Best boy o para Charity fundraiser los trozos sueltos de hueso y Database administrator. ? Ciruga de reemplazo articular. Es posible que necesite esta ciruga si tiene una artrosis muy grave (avanzada). Siga estas indicaciones en su casa: [place] articulaciones afectadas segn las indicaciones del mdico.  No conduzca ni use maquinaria pesada  mientras toma analgsicos recetados.  Haga ejercicio segn le indiquen. Es posible que el mdico o el fisioterapeuta le recomienden tipos especficos de ejercicio, tales como: ? Ejercicios de fortalecimiento. Se realizan para fortalecer los msculos que sostienen las articulaciones afectadas por la artritis. Pueden realizarse con peso o con bandas para agregar resistencia. ? Ejercicios aerbicos. Son ejercicios, como caminar a paso ligero o hacer gimnasia [place] acutica, que aumentan la actividad del corazn. ? Actividades de amplitud de movimientos. [place] articulaciones. ? Ejercicios de equilibrio y [place]. Control del dolor, la rigidez y la hinchazn      Si se lo indican, aplique calor en la zona afectada con la frecuencia que le haya indicado el mdico. Use la fuente de calor que el mdico le recomiende, como una compresa de calor hmedo o una almohadilla trmica. ? Si tiene un dispositivo de ayuda que se puede quitar, quteselo segn lo indicado por su mdico. ? Coloque una toalla entre la piel y la fuente de Freight forwarder. Si el mdico le indica que no se quite el dispositivo de HCA Inc se Passenger transport manager, coloque una toalla entre el dispositivo de [place] y la fuente de Freight forwarder. ? Aplique el calor durante 20 a 58minutos. ? Retire la fuente de calor si la piel se pone de color rojo brillante. Esto es muy importante si no puede Education officer, environmental, calor o fro. Puede correr un riesgo mayor de sufrir quemaduras.  Si se lo indican, aplique hielo sobre la articulacin afectada: ? Si tiene un dispositivo de ayuda que se puede quitar, quteselo segn lo indicado por su mdico. ? Ponga el hielo en una bolsa plstica. ? Coloque una [employer] piel y la bolsa de hielo.  Si el mdico le indica que no se quite el dispositivo de HCA Inc se aplica hielo, coloque una toalla entre el dispositivo de [place] y la bolsa de hielo. ? Coloque el hielo durante 54minutos, 2 a 3veces  por da. Instrucciones generales  Delphi de venta libre y los recetados solamente como se lo haya indicado el mdico.  Mantenga un peso saludable. Siga las instrucciones de su mdico con respecto al control de su peso. Estas pueden incluir restricciones en la dieta.  No consuma ningn producto que contenga nicotina o tabaco, como cigarrillos y Psychologist, sport and exercise. Estos pueden retrasar la consolidacin del [place]. Si necesita ayuda para dejar de fumar, consulte al [employer].  Use los dispositivos de [employer] Corporation se lo haya indicado el mdico.  Concurra a todas las visitas de seguimiento como se lo haya indicado el mdico. Esto es importante. Dnde encontrar ms informacin:  Air traffic controller de Reumatismo Articular y Arboriculturist Musculoesquelticas y Insurance underwriter [facility] of Arthritis and Musculoskeletal and Skin Diseases): www.niams.SouthExposed.es  [place] ([employer] on Aging): http://kim-miller.com/  [facility] de Reumatolog[facility] ([place] of Rheumatology): www.rheumatology.org Comunquese con un mdico si:  Su piel se pone roja.  Presenta una erupcin cutnea.  Siente un dolor que [place].  Tiene fiebre y siente dolor en la articulacin o el msculo. Solicite ayuda de inmediato si:  [employer] Scripts.  Pierde el apetito repentinamente.  Transpira durante la noche. Resumen  La artrosis es una clase de artritis que afecta el tejido que cubre los extremos de los huesos en las articulaciones (cartlago).  El desgaste del cartlago que cubre los extremos de los huesos relacionado con la edad, causa esta afeccin.  Los sntomas principales de esta enfermedad son dolor, hinchazn y Advertising account executive.  No hay cura para esta enfermedad, pero el tratamiento puede ayudar a Financial controller y Teacher, English as a foreign language el funcionamiento de Water engineer. Esta informacin no tiene Marine scientist el consejo  del mdico. Asegrese de hacerle al mdico cualquier pregunta que tenga. Document Revised: 01/23/2018 Document Reviewed: 07/02/2013 Elsevier Patient Education  2020 Elsevier Inc.      Agustina Caroli, MD [facility]

## 2020-07-28 NOTE — Patient Instructions (Addendum)
If you have lab work done today you will be contacted with your lab results within the next 2 weeks.  If you have not heard from [place] then please contact us. The fastest way to get your results is to register for My Chart.   IF you received an x-ray today, you will receive an invoice from [facility]. Please contact [facility] at [phone] with questions or concerns regarding your invoice.   IF you received labwork today, you will receive an invoice from [facility]. Please contact [facility] at [phone] with questions or concerns regarding your invoice.   Our billing staff will not be able to assist you with questions regarding bills from these companies.  You will be contacted with the lab results as soon as they are available. The fastest way to get your results is to activate your My Chart account. Instructions are located on the last page of this paperwork. If you have not heard from [place] regarding the results in 2 weeks, please contact this office.      Artrosis Osteoarthritis  La artrosis es un tipo de artritis que afecta el tejido que cubre los extremos de los huesos en las articulaciones (cartlago). El cartlago acta como amortiguador [employer] Company y los ayuda a moverse con suavidad. La artrosis se produce cuando el cartlago de las articulaciones se gasta. A veces, la artrosis se denomina artritis "por uso y desgaste". La artrosis es la forma ms frecuente de artritis. A menudo, afecta a las [employer] American. Es una enfermedad que empeora con el tiempo (una enfermedad progresiva). Esta enfermedad afecta con ms frecuencia las articulaciones de:  Los dedos de [employer]ott.  Los dedos Kellogg.  La cadera.  Las rodillas.  La columna vertebral, incluido el cuello y la zona lumbar. Cules son las causas? El desgaste del cartlago que cubre los extremos de los huesos relacionado con la edad, causa esta afeccin. Qu incrementa el riesgo? Los  siguientes factores pueden hacer que usted sea ms propenso a Best boy esta afeccin:  Edad avanzada.  Tener exceso de Dillard u obesidad.  Uso excesivo de las articulaciones, como en el caso de los [place].  Lesin pasada de Insurance claims handler.  Ciruga pasada en una articulacin.  Antecedentes familiares de artrosis. Cules son los signos o los sntomas? Los principales sntomas de esta enfermedad son dolor, hinchazn y Advertising account executive. Con el tiempo, la articulacin puede perder su forma. Pequeos trozos de Praxair o Biomedical scientist y flotar dentro de la articulacin, lo cual puede causar ms dolor y Agricultural consultant. Pueden formarse pequeos depsitos de hueso (ostefitos) en los extremos de Water engineer. Otros sntomas pueden incluir lo siguiente:  Una sensacin de chirrido o raspado dentro de la articulacin al moverla.  Sonidos de chasquido o crujido al [employer]. Los sntomas pueden afectar una o ms articulaciones. La artrosis en una articulacin principal, como la rodilla o la cadera, puede causar dolor al caminar o al realizar ejercicio. Si tiene artrosis IAC[employer]InterActiveCorp, es posible que no pueda agarrar objetos, torcer la mano o controlar pequeos movimientos de las manos y los dedos (motricidad fina). Cmo se diagnostica? Esta afeccin se puede diagnosticar en funcin de lo siguiente:  Sus antecedentes mdicos.  Un examen fsico.  Sus sntomas.  Radiografas de la(s) articulacin(es) afectada(s).  Anlisis de sangre para descartar otros tipos de artritis. Cmo se trata? No hay cura para esta enfermedad, pero el tratamiento puede ayudar a  controlar Conservation officer, historic buildings y mejorar el funcionamiento de la articulacin. Los planes de tratamiento pueden incluir:  Un programa de ejercicio indicado que permita el descanso y el alivio de la articulacin. Puede trabajar con un fisioterapeuta.  Un plan de control del peso.  Tcnicas de [employer]um[employer]ident,  como: ? Aplicacin de calor y fro en la articulacin. ? Impulsos elctricos aplicados a las terminaciones nerviosas que se encuentran debajo de la piel (estimulacin nerviosa elctrica transcutnea o TENS). ? Masajes. ? Ciertos suplementos nutricionales.  Antiinflamatorios no esteroideos (AINE) o medicamentos recetados para ayudar a Best boy.  Medicamentos para ayudar a Best boy y la inflamacin (corticoesteroides). Estos se pueden administrar por boca (va oral) o mediante una inyeccin.  Dispositivos de [place], como un dispositivo ortopdico, una frula, un guante especial o un bastn.  Ciruga, como: ? Imelda Pillow. Se hace para volver a posicionar los Aff[employer] Computer Services y Best boy o para Charity fundraiser los trozos sueltos de hueso y Database administrator. ? Ciruga de reemplazo articular. Es posible que necesite esta ciruga si tiene una artrosis muy grave (avanzada). Siga estas indicaciones en su casa: [place] articulaciones afectadas segn las indicaciones del mdico.  No conduzca ni use maquinaria pesada mientras toma analgsicos recetados.  Haga ejercicio segn le indiquen. Es posible que el mdico o el fisioterapeuta le recomienden tipos especficos de ejercicio, tales como: ? Ejercicios de fortalecimiento. Se realizan para fortalecer los msculos que sostienen las articulaciones afectadas por la artritis. Pueden realizarse con peso o con bandas para agregar resistencia. ? Ejercicios aerbicos. Son ejercicios, como caminar a paso ligero o hacer gimnasia [place] acutica, que aumentan la actividad del corazn. ? Actividades de amplitud de movimientos. [place] articulaciones. ? Ejercicios de equilibrio y [place]. Control del dolor, la rigidez y la hinchazn      Si se lo indican, aplique calor en la zona afectada con la frecuencia que le haya indicado el mdico. Use la fuente de calor que el mdico le recomiende, como una compresa de calor  hmedo o una almohadilla trmica. ? Si tiene un dispositivo de ayuda que se puede quitar, quteselo segn lo indicado por su mdico. ? Coloque una toalla entre la piel y la fuente de Freight forwarder. Si el mdico le indica que no se quite el dispositivo de HCA Inc se Passenger transport manager, coloque una toalla entre el dispositivo de [place] y la fuente de Freight forwarder. ? Aplique el calor durante 20 a 58minutos. ? Retire la fuente de calor si la piel se pone de color rojo brillante. Esto es muy importante si no puede Education officer, environmental, calor o fro. Puede correr un riesgo mayor de sufrir quemaduras.  Si se lo indican, aplique hielo sobre la articulacin afectada: ? Si tiene un dispositivo de ayuda que se puede quitar, quteselo segn lo indicado por su mdico. ? Ponga el hielo en una bolsa plstica. ? Coloque una [employer] piel y la bolsa de hielo. Si el mdico le indica que no se quite el dispositivo de HCA Inc se aplica hielo, coloque una toalla entre el dispositivo de [place] y la bolsa de hielo. ? Coloque el hielo durante 25minutos, 2 a 3veces por da. Instrucciones generales  Delphi de venta libre y los recetados solamente como se lo haya indicado el mdico.  Mantenga un peso saludable. Siga las instrucciones de su mdico con respecto al control de su peso. Estas pueden incluir restricciones en la dieta.  No consuma ningn  producto que contenga nicotina o tabaco, como cigarrillos y Psychologist, sport and exercise. Estos pueden retrasar la consolidacin del [place]. Si necesita ayuda para dejar de fumar, consulte al [employer].  Use los dispositivos de [employer] Corporation se lo haya indicado el mdico.  Concurra a todas las visitas de seguimiento como se lo haya indicado el mdico. Esto es importante. Dnde encontrar ms informacin:  Air traffic controller de Reumatismo Articular y Arboriculturist Musculoesquelticas y Insurance underwriter [employer] of Arthritis and Musculoskeletal and Skin Diseases):  www.niams.SouthExposed.es  [place] ([employer] on Aging): http://kim-miller.com/  Instituto Estadoun[facility]ense de Reumatologa ([place] of Rheumatology): www.rheumatology.org Comunquese con un mdico si:  Su piel se pone roja.  Presenta una erupcin cutnea.  Siente un dolor que [place].  Tiene fiebre y siente dolor en la articulacin o el msculo. Solicite ayuda de inmediato si:  Express Scripts.  Pierde el apetito repentinamente.  Transpira durante la noche. Resumen  La artrosis es una clase de artritis que afecta el tejido que cubre los extremos de los huesos en las articulaciones (cartlago).  El desgaste del cartlago que cubre los extremos de los huesos relacionado con la edad, causa esta afeccin.  Los sntomas principales de esta enfermedad son dolor, hinchazn y Advertising account executive.  No hay cura para esta enfermedad, pero el tratamiento puede ayudar a Financial controller y Teacher, English as a foreign language el funcionamiento de Water engineer. Esta informacin no tiene Marine scientist el consejo del mdico. Asegrese de hacerle al mdico cualquier pregunta que tenga. Document Revised: 01/23/2018 Document Reviewed: 07/02/2013 Elsevier Patient Education  2020 [employer].

## 2020-07-28 NOTE — Addendum Note (Signed)
Addended by: Davina Poke on: 07/28/2020 04:35 PM   Modules accepted: Orders

## 2020-08-25 ENCOUNTER — Other Ambulatory Visit: Payer: Self-pay

## 2020-08-25 ENCOUNTER — Ambulatory Visit: Payer: Self-pay

## 2020-08-25 ENCOUNTER — Ambulatory Visit (INDEPENDENT_AMBULATORY_CARE_PROVIDER_SITE_OTHER): Payer: 59 | Admitting: Internal Medicine

## 2020-08-25 ENCOUNTER — Encounter: Payer: Self-pay | Admitting: Internal Medicine

## 2020-08-25 VITALS — BP 135/74 | HR 64 | Ht 64.0 in | Wt 188.8 lb

## 2020-08-25 DIAGNOSIS — M79641 Pain in right hand: Secondary | ICD-10-CM | POA: Insufficient documentation

## 2020-08-25 DIAGNOSIS — M159 Polyosteoarthritis, unspecified: Secondary | ICD-10-CM

## 2020-08-25 DIAGNOSIS — M150 Primary generalized (osteo)arthritis: Secondary | ICD-10-CM

## 2020-08-25 DIAGNOSIS — M79642 Pain in left hand: Secondary | ICD-10-CM | POA: Diagnosis not present

## 2020-08-25 DIAGNOSIS — M8949 Other hypertrophic osteoarthropathy, multiple sites: Secondary | ICD-10-CM

## 2020-08-25 NOTE — Progress Notes (Signed)
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  History of Present Illness: Stephanie Gibson is a 54 y.o. female for evaluation of arthritis.She has bilateral hand pain bothering her for the past 2-3 months in particular. The exact onset was insidious but she is sure it is after her COVID infection in December 2020. 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 She notices swelling some of the time, especially in the right hand in the morning. She also complains of knee pain and cracking that is worse recently. She has shoulder pain with movement and numbness going down her arms most noticeable in the morning. She does also have intermittent all over body pain and sensitivity to touch. She denies irritable bowel syndrome history but has constipation sometimes. She does feel fatigued fairly often without particular sleep disturbance.  DXA 10/2017 normal Chest xray 10/2019  Activities of Daily Living:  Patient reports morning stiffness for 30 minutes.   Patient Reports nocturnal pain.  Difficulty dressing/grooming: Reports Difficulty climbing stairs: Denies Difficulty getting out of chair: Reports Difficulty using hands for taps, buttons, cutlery, and/or writing: Reports  Review of Systems  Constitutional: Negative for fatigue.  HENT: Positive for mouth dryness. Negative for mouth sores and nose dryness.   Eyes: Positive for visual disturbance. Negative for pain, itching and dryness.  Respiratory: Positive for cough and shortness of breath. Negative for  hemoptysis and difficulty breathing.        Per patient since having COVID (December 2020) she has had an increased cough. Per patient occasional SOB, Albuterol helps.   Cardiovascular: Positive for palpitations. Negative for chest pain and swelling in legs/feet.  Gastrointestinal: Negative for abdominal pain, blood in stool, constipation and diarrhea.  Endocrine: Positive for increased urination.  Genitourinary: Negative for painful urination.  Musculoskeletal: Positive for arthralgias, joint pain, joint swelling and morning stiffness. Negative for myalgias, muscle weakness, muscle tenderness and myalgias.  Skin: Negative for color change, rash and redness.  Allergic/Immunologic: Negative for susceptible to infections.  Neurological: Positive for dizziness. Negative for numbness, headaches and weakness.  Hematological: Positive for swollen glands.       Per patient axillary glands sometimes swell.   Psychiatric/Behavioral: Positive for sleep disturbance.    PMFS History:  Patient Active Problem List   Diagnosis Date Noted   Bilateral hand pain 08/25/2020   Primary osteoarthritis involving multiple joints 07/28/2020   Arthritis 03/30/2019   Seasonal allergies 03/30/2019   Menopausal symptom 05/28/2017   Mild intermittent asthma without complication 56/31/4970   Intramural leiomyoma of uterus 05/03/2014   Overweight(278.02) 04/12/2014   HTN (hypertension) 04/12/2014    Past Medical History:  Diagnosis Date   Anemia    Ectopic pregnancy    Essential hypertension    Leiomyoma of uterus    Pneumonia    Shortness of breath dyspnea     Family History  Problem Relation Age of Onset   Hypertension Mother    Arthritis Mother  Cancer Father        THROAT    Hypertension Sister    Hypertension Brother    Colon cancer Neg Hx    Past Surgical History:  Procedure Laterality Date   CESAREAN SECTION     CYSTOSCOPY N/A 10/21/2015   Procedure: CYSTOSCOPY;   Surgeon: Terrance Mass, MD;  Location: [facility];  Service: Gynecology;  Laterality: N/A;   LAPAROSCOPIC VAGINAL HYSTERECTOMY WITH SALPINGO OOPHORECTOMY N/A 10/21/2015   Procedure: LAPAROSCOPIC ASSISTED VAGINAL HYSTERECTOMY;  Surgeon: Terrance Mass, MD;  Location: [facility];  Service: Gynecology;  Laterality: N/A;   OOPHORECTOMY     SALPINGOOPHORECTOMY Left 10/21/2015   Procedure: SALPINGO OOPHORECTOMY;  Surgeon: Terrance Mass, MD;  Location: [facility];  Service: Gynecology;  Laterality: Left;   TUBAL LIGATION     Social History   Social History Narrative   Not on file   Immunization History  Administered Date(s) Administered   Influenza,inj,Quad PF,6+ Mos 10/09/2015, 10/15/2017   PFIZER SARS-COV-2 Vaccination 01/19/2020, 02/11/2020     Objective: Vital Signs: BP 135/74 (BP Location: Right Arm, Patient Position: Sitting, Cuff Size: Small)    Pulse 64    Ht 5\' 4"  (1.626 m)    Wt 188 lb 12.8 oz (85.6 kg)    LMP 08/10/2015 (Approximate) Comment: continuous bleeding/spotting for months   BMI 32.41 kg/m    Physical Exam HENT:     Right Ear: External ear normal.     Left Ear: External ear normal.  Eyes:     Conjunctiva/sclera: Conjunctivae normal.     Pupils: Pupils are equal, round, and reactive to light.  Skin:    General: Skin is warm and dry.     Findings: No rash.  Neurological:     General: No focal deficit present.     Mental Status: She is alert.     Musculoskeletal Exam:  Neck full range of motion no tenderness Shoulders full ROM but painful bilaterally with abduction and external rotation also with extension internal rotation not significant point tenderness or swelling and strength normal Elbows full range of motion no tenderness or swelling Right wrist full range of motion but mildly tender no swelling left wrist normal Tenderness to palpation over right fifth MCP, right fourth PIP else fingers not tender to touch, slight rotation of fifth PIP and DIP joints,  normal grip strength and full range of motion in bilateral hands Normal hip internal and external rotation without pain, no tenderness to lateral hip palpation Knees, ankles, MTPs full range of motion no tenderness or swelling, patellofemoral crepitus present  CDAI Exam: CDAI Score: -- Patient Global: --; Provider Global: -- Swollen: 1 ; Tender: 3  Joint Exam 08/25/2020      Right  Left  Wrist   Tender     MCP 5   Tender     PIP 4  Swollen Tender        Investigation: No additional findings.  Imaging: XR Hand 2 View Left  Result Date: 08/25/2020 X-ray left hand 2 views Normal radiocarpal and carpal joint spaces.  Slight rotation or possibly slight joint space loss in fifth PIP joint.  Otherwise normal MCP, PIP, and DIP joints appearance.  Cystic change in first proximal phalanx and second middle phalanx.  Bone mineralization appears normal.  No soft tissue swelling seen. Impression Possible slight degenerative changes with no inflammatory changes.  XR Hand 2 View Right  Result Date: 08/25/2020 X-ray right hand 2 view Radiocarpal and carpal joint  spaces appear normal.  MCP joint spaces preserved.  Slight asymmetric joint space narrowing in the fifth PIP joint.  Other PIP and DIP joints appear normal.  Bone mineralization is normal.  No soft tissue swelling seen. Impression Mild degenerative changes in fifth digit otherwise normal x-ray.   Recent Labs: Lab Results  Component Value Date   WBC 5.8 06/25/2016   HGB 13.7 06/25/2016   PLT 179 06/25/2016   NA 140 03/25/2020   K 3.8 03/25/2020   CL 101 03/25/2020   CO2 24 03/25/2020   GLUCOSE 104 (H) 03/25/2020   BUN 14 03/25/2020   CREATININE 0.67 03/25/2020   BILITOT 0.3 03/25/2020   ALKPHOS 98 03/25/2020   AST 20 03/25/2020   ALT 26 03/25/2020   PROT 7.1 03/25/2020   ALBUMIN 4.3 03/25/2020   CALCIUM 9.5 03/25/2020   GFRAA 114 03/25/2020    Speciality Comments: No specialty comments available.  Procedures:  No  procedures performed Allergies: Patient has no known allergies.   Assessment / Plan:     Visit Diagnoses: Bilateral hand pain - Plan: XR Hand 2 View Right, XR Hand 2 View Left, Cyclic citrul peptide antibody, IgG, Rheumatoid factor, Sedimentation rate, C-reactive protein  Bilateral upper extremity and particularly hand pain which is worsening over the past few months.  Her shoulder pain sounds most consistent with impingement or musculotendinous strain as it is exacerbated with active range of motion and lying in bed overnight.  Hand pain with stiffness and swelling worst first thing in the morning improving with use could be consistent with inflammatory arthritis.  There is slight enlargement of the right fourth PIP compared with the left.  Some of her hand going to sleep in the morning also sound suggestive for carpal tunnel syndrome.  This would not usually explain the isolated joint enlargement but could deftly be contributing to total symptoms.  We will check hand x-rays today for any inflammatory changes as well as RA associated antibodies and inflammatory markers.  Also follow-up in a few weeks for clinical progress and revisit if any new testing or treatment are indicated.  Primary osteoarthritis involving multiple joints  Knee osteoarthritis as demonstrable by patellofemoral crepitus although normal range of motion.  She has pain when on her feet for long times but not significantly tender on palpation.  She is currently taking oral diclofenac as needed for joint pain which is reasonable discussed option for topical therapy or alternatives if she noticed side effects such as stomach irritation with this.  Orders: Orders Placed This Encounter  Procedures   XR Hand 2 View Right   XR Hand 2 View Left   Cyclic citrul peptide antibody, IgG   Rheumatoid factor   Sedimentation rate   C-reactive protein   No orders of the defined types were placed in this encounter.    Follow-Up  Instructions: Return in about 2 weeks (around 09/08/2020).   Collier Salina, MD  Note - This record has been created using [employer].  Chart creation errors have been sought, but may not always  have been located. Such creation errors do not reflect on  the standard of medical care.

## 2020-08-25 NOTE — Patient Instructions (Signed)
Ejercicios para los hombros Shoulder Exercises Pregunte al mdico qu ejercicios son seguros para usted. Haga los ejercicios exactamente como se lo haya indicado el mdico y gradelos como se lo hayan indicado. Es normal sentir un estiramiento leve, tironeo, opresin o Tree surgeon al [employer] [place]. Detngase de inmediato si siente un dolor repentino o Printmaker. No comience a hacer estos ejercicios hasta que se lo indique el mdico. Ejercicios de estiramiento Rotacin externa y abduccin Este ejercicio a veces se denomina estiramiento en una esquina. Este ejercicio rota el brazo Emergency planning/management officer (rotacin externa) y aleja el brazo del cuerpo (abduccin). 1. Prese en una entrada de una puerta con un pie adelante del otro, a una corta distancia uno del otro. Esto se denomina escalonamiento. Si no llega a apoyar los [employer] de la [place], prese frente a una esquina de la habitacin. 2. Elija una de estas posiciones, como se lo haya indicado el mdico: ? [place] y los antebrazos sobre el marco de la [place], por encima de la cabeza. ? [place] y los antebrazos sobre el marco de la puerta, a la altura de la cabeza. ? [place] y los antebrazos sobre el marco de la puerta, a la altura de los codos. 3. Lentamente, lleve el peso al pie de adelante hasta sentir un estiramiento en el pecho y en la parte de adelante de los hombros. Mantenga la cabeza y el pecho erguidos, y los msculos abdominales tensionados. 4. Mantenga esta posicin durante 10 segundos. 5. Para aflojar el estiramiento, lleve el peso al pie de atrs. Repita 4 veces. Realice este ejercicio 1 veces al da. Extensin, de pie 1. Prese y sostenga un palo de escoba, un bastn o un objeto similar detrs de la espalda. ? Las manos deben estar separadas a una distancia un poco mayor que el ancho de los hombros. ? Las palmas deben estar en direccin contraria a la espalda. 2. Manteniendo los codos  extendidos y los msculos de los hombros relajados, aleje el palo del cuerpo hasta sentir un estiramiento en el hombro (extensin). ? No encoja los hombros al Teacher, English as a foreign language. Mantenga los omplatos juntos, llvelos hacia el centro de la espalda. 3. Mantenga esta posicin durante 10 segundos. 4. Vuelva lentamente a la posicin inicial. Repita 4 veces. Realice este ejercicio 1 veces al da. Ejercicios de amplitud de movimientos Flexin del hombro, de pie  1. Prese y sostenga un palo de escoba, un bastn o un objeto similar. [place] sobre el objeto a una distancia un poco mayor que el ancho de los hombros. [place] izquierda/derecha debe estar con la palma Dwyane Luo arriba, y la otra mano debe estar con la palma hacia abajo. 2. Mantenga el codo extendido y los msculos del hombro relajados. Eleve el palo con el brazo sano para levantar el brazo izquierdo/derecho frente al cuerpo y luego sobre la cabeza hasta sentir un estiramiento en el hombro (flexin). ? Evite encoger el hombro mientras levanta el brazo. Mantenga los omplatos juntos, llvelos hacia el centro de la espalda. 3. Mantenga esta posicin durante 10 segundos. 4. Vuelva lentamente a la posicin inicial. Repita 4 veces. Realice este ejercicio 1 veces al da. Abduccin del hombro, de pie 1. Prese y sostenga un palo de escoba, un bastn o un objeto similar. [place] sobre el objeto a una distancia un poco mayor que el ancho de los hombros. La mano izquierda/derecha debe estar con la palma  Sharyne Peach, y la otra mano debe estar con la palma hacia abajo. 2. Mantenga el codo extendido y los msculos del hombro relajados. Empuje el objeto cruzando el cuerpo hacia el lado izquierdo/derecho. [place] el brazo izquierdo/derecho al costado del cuerpo (abduccin) y luego sobre la cabeza hasta sentir un estiramiento en el hombro. ? No levante el brazo por encima de la altura del hombro, a menos que el mdico se lo  indique. ? Si se lo indican, eleve el brazo [employer]. ? Evite encoger el hombro mientras levanta el brazo. Mantenga los omplatos juntos, llvelos hacia el centro de la espalda. 3. Mantenga esta posicin durante 10 segundos. 4. Vuelva lentamente a la posicin inicial. Repita 4 veces. Realice este ejercicio 1 veces al da. Rotacin interna  1. Coloque la mano izquierda/derecha detrs de la espalda, con la palma [place] arriba. 2. Con la otra mano, cuelgue una banda para ejercicios, una toalla o un objeto similar sobre el hombro. Agarre la banda con la mano izquierda/derecha, de modo de tener agarrados ambos extremos. 3. Suavemente, tire de la banda hacia arriba hasta sentir un estiramiento en la parte de adelante del hombro izquierdo/derecho. El movimiento del brazo hacia el centro del cuerpo se llama rotacin interna. ? Evite encoger el hombro mientras levanta el brazo. Mantenga los omplatos juntos, llvelos hacia el centro de la espalda. 4. Mantenga esta posicin durante 10 segundos. 5. Afloje el estiramiento, suelte la banda y [employer]. Repita 4 veces. Realice este ejercicio 1 veces al da. Esta informacin no tiene Marine scientist el consejo del mdico. Asegrese de hacerle al mdico cualquier pregunta que tenga. Document Revised: 12/26/2018 Document Reviewed: 12/26/2018 Elsevier Patient Education  [place].

## 2020-08-26 LAB — C-REACTIVE PROTEIN: CRP: 10.5 mg/L — ABNORMAL HIGH (ref <8.0)

## 2020-08-26 LAB — RHEUMATOID FACTOR: Rhuematoid fact SerPl-aCnc: <14 IU/mL (ref <14)

## 2020-08-26 LAB — SEDIMENTATION RATE: Sed Rate: 9 mm/h (ref 0–30)

## 2020-08-26 LAB — CYCLIC CITRUL PEPTIDE ANTIBODY, IGG: Cyclic Citrullin Peptide Ab: <16 UNITS

## 2020-08-27 NOTE — Progress Notes (Signed)
Labs for rheumatoid arthritis antibodies are negative. Inflammation markers one is negative one is borderline.

## 2020-09-08 NOTE — Progress Notes (Deleted)
Office Visit Note  Patient: Stephanie Gibson             Date of Birth: 20-Feb-1966           MRN: [id number]             PCP: Stephanie Pollen, MD Referring: Stephanie Gibson, * Visit Date: 09/09/2020 Occupation: @GUAROCC @  Subjective:  No chief complaint on file.   History of Present Illness: Stephanie Gibson is a 54 y.o. female ***     History of Present Illness: Stephanie Gibson is a 54 y.o. female for evaluation of arthritis.She has bilateral hand pain bothering her for the past 2-3 months in particular. The exact onset was insidious but she is sure it is after her COVID infection in December 2020. 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 She notices swelling some of the time, especially in the right hand in the morning. She also complains of knee pain and cracking that is worse recently. She has shoulder pain with movement and numbness going down her arms most noticeable in the morning. She does also have intermittent all over body pain and sensitivity to touch. She denies irritable bowel syndrome history but has constipation sometimes. She does feel fatigued fairly often without particular sleep disturbance.  Labs 08/2020 RF negative CCP negative ESR normal CRP 10.5  Imaging 08/2020 Xray hands mild degenerative changes  DXA 10/2017 normal Chest xray 10/2019   Activities of Daily Living:  Patient reports morning stiffness for *** {minute/hour:19697}.   Patient {ACTIONS;DENIES/REPORTS:21021675::"Denies"} nocturnal pain.  Difficulty dressing/grooming: {ACTIONS;DENIES/REPORTS:21021675::"Denies"} Difficulty climbing stairs: {ACTIONS;DENIES/REPORTS:21021675::"Denies"} Difficulty getting out of chair: {ACTIONS;DENIES/REPORTS:21021675::"Denies"} Difficulty using hands for taps, buttons, cutlery, and/or writing: {ACTIONS;DENIES/REPORTS:21021675::"Denies"}  No Rheumatology ROS  completed.   PMFS History:  Patient Active Problem List   Diagnosis Date Noted  . Bilateral hand pain 08/25/2020  . Primary osteoarthritis involving multiple joints 07/28/2020  . Arthritis 03/30/2019  . Seasonal allergies 03/30/2019  . Menopausal symptom 05/28/2017  . Mild intermittent asthma without complication 95/62/1308  . Intramural leiomyoma of uterus 05/03/2014  . Overweight(278.02) 04/12/2014  . HTN (hypertension) 04/12/2014    Past Medical History:  Diagnosis Date  . Anemia   . Ectopic pregnancy   . Essential hypertension   . Leiomyoma of uterus   . Pneumonia   . Shortness of breath dyspnea     Family History  Problem Relation Age of Onset  . Hypertension Mother   . Arthritis Mother   . Cancer Father        THROAT   . Hypertension Sister   . Hypertension Brother   . Colon cancer Neg Hx    Past Surgical History:  Procedure Laterality Date  . CESAREAN SECTION    . CYSTOSCOPY N/A 10/21/2015   Procedure: CYSTOSCOPY;  Surgeon: Stephanie Mass, MD;  Location: [facility];  Service: Gynecology;  Laterality: N/A;  . LAPAROSCOPIC VAGINAL HYSTERECTOMY WITH SALPINGO OOPHORECTOMY N/A 10/21/2015   Procedure: LAPAROSCOPIC ASSISTED VAGINAL HYSTERECTOMY;  Surgeon: Stephanie Mass, MD;  Location: [facility];  Service: Gynecology;  Laterality: N/A;  . OOPHORECTOMY    . SALPINGOOPHORECTOMY Left 10/21/2015   Procedure: SALPINGO OOPHORECTOMY;  Surgeon: Stephanie Mass, MD;  Location: [facility];  Service: Gynecology;  Laterality: Left;  . TUBAL LIGATION     Social History   Social History Narrative  . Not on file   Immunization History  Administered Date(s) Administered  .  Influenza,inj,Quad PF,6+ Mos 10/09/2015, 10/15/2017  . PFIZER SARS-COV-2 Vaccination 01/19/2020, 02/11/2020     Objective: Vital Signs: LMP 08/10/2015 (Approximate) Comment: continuous bleeding/spotting for months   Physical Exam   Musculoskeletal Exam: ***  CDAI Exam: CDAI Score: -- Patient Global: --;  Provider Global: -- Swollen: --; Tender: -- Joint Exam 09/09/2020   No joint exam has been documented for this visit   There is currently no information documented on the homunculus. Go to the Rheumatology activity and complete the homunculus joint exam.  Investigation: No additional findings.  Imaging: XR Hand 2 View Left  Result Date: 08/25/2020 X-ray left hand 2 views Normal radiocarpal and carpal joint spaces.  Slight rotation or possibly slight joint space loss in fifth PIP joint.  Otherwise normal MCP, PIP, and DIP joints appearance.  Cystic change in first proximal phalanx and second middle phalanx.  Bone mineralization appears normal.  No soft tissue swelling seen. Impression Possible slight degenerative changes with no inflammatory changes.  XR Hand 2 View Right  Result Date: 08/25/2020 X-ray right hand 2 view Radiocarpal and carpal joint spaces appear normal.  MCP joint spaces preserved.  Slight asymmetric joint space narrowing in the fifth PIP joint.  Other PIP and DIP joints appear normal.  Bone mineralization is normal.  No soft tissue swelling seen. Impression Mild degenerative changes in fifth digit otherwise normal x-ray.   Recent Labs: Lab Results  Component Value Date   WBC 5.8 06/25/2016   HGB 13.7 06/25/2016   PLT 179 06/25/2016   NA 140 03/25/2020   K 3.8 03/25/2020   CL 101 03/25/2020   CO2 24 03/25/2020   GLUCOSE 104 (H) 03/25/2020   BUN 14 03/25/2020   CREATININE 0.67 03/25/2020   BILITOT 0.3 03/25/2020   ALKPHOS 98 03/25/2020   AST 20 03/25/2020   ALT 26 03/25/2020   PROT 7.1 03/25/2020   ALBUMIN 4.3 03/25/2020   CALCIUM 9.5 03/25/2020   GFRAA 114 03/25/2020    Speciality Comments: No specialty comments available.  Procedures:  No procedures performed Allergies: Patient has no known allergies.   Assessment / Plan:     Visit Diagnoses: No diagnosis found.  Orders: No orders of the defined types were placed in this encounter.  No orders  of the defined types were placed in this encounter.   Face-to-face time spent with patient was *** minutes. Greater than 50% of time was spent in counseling and coordination of care.  Follow-Up Instructions: No follow-ups on file.   Stephanie Salina, MD  Note - This record has been created using [employer].  Chart creation errors have been sought, but may not always  have been located. Such creation errors do not reflect on  the standard of medical care.

## 2020-09-09 ENCOUNTER — Ambulatory Visit: Payer: 59 | Admitting: Internal Medicine

## 2021-02-23 ENCOUNTER — Ambulatory Visit: Payer: 59 | Admitting: Emergency Medicine

## 2021-03-02 ENCOUNTER — Ambulatory Visit: Payer: 59 | Admitting: Emergency Medicine

## 2021-03-12 ENCOUNTER — Ambulatory Visit (INDEPENDENT_AMBULATORY_CARE_PROVIDER_SITE_OTHER): Payer: 59 | Admitting: Emergency Medicine

## 2021-03-12 ENCOUNTER — Encounter: Payer: Self-pay | Admitting: Emergency Medicine

## 2021-03-12 ENCOUNTER — Other Ambulatory Visit: Payer: Self-pay

## 2021-03-12 VITALS — BP 154/82 | HR 82 | Temp 98.4°F | Ht 62.0 in | Wt 185.4 lb

## 2021-03-12 DIAGNOSIS — M8949 Other hypertrophic osteoarthropathy, multiple sites: Secondary | ICD-10-CM

## 2021-03-12 DIAGNOSIS — M79641 Pain in right hand: Secondary | ICD-10-CM | POA: Diagnosis not present

## 2021-03-12 DIAGNOSIS — M159 Polyosteoarthritis, unspecified: Secondary | ICD-10-CM

## 2021-03-12 MED ORDER — DICLOFENAC SODIUM 75 MG PO TBEC: 75.0000 mg | DELAYED_RELEASE_TABLET | Freq: Two times a day (BID) | ORAL | 0 refills | Status: DC

## 2021-03-12 NOTE — Patient Instructions (Signed)
Sndrome del tnel carpiano Carpal Tunnel Syndrome  El sndrome del tnel carpiano es una afeccin que causa dolor, debilidad y adormecimiento en la mano y los dedos. El adormecimiento es cuando no siente una zona del cuerpo. El tnel carpiano es un rea estrecha que se encuentra en el lado palmar de la mueca. Los movimientos repetidos de la mueca o determinadas enfermedades pueden causar hinchazn en el tnel. Esta hinchazn puede comprimir el nervio principal de la mueca. Este nervio se llama "nervio mediano". Cules son las causas? Esta afeccin puede ser causada por lo siguiente:  Mover la mano y la mueca una y otra vez mientras realiza una tarea.  Lesin en la mueca.  Artritis.  Un saco lleno de lquido (quiste) o un crecimiento anormal (tumor) en el tnel carpiano.  Acumulacin de lquido durante el embarazo.  Uso de herramientas que vibran. Algunas veces, la causa no se conoce. Qu incrementa el riesgo? Los siguientes factores pueden hacer que sea ms propenso a tener esta afeccin:  Tener un trabajo en el que deba hacer estas cosas: ? Mover la mano una y otra vez. ? Trabajar con herramientas que vibran, como taladros o lijadoras.  Ser mujer.  Tener diabetes, obesidad, problemas de tiroides o insuficiencia renal. Cules son los signos o sntomas? Los sntomas de esta afeccin incluyen:  Sensacin de hormigueo en los dedos.  Hormigueo o prdida de la sensibilidad de la mano.  Dolor en todo el brazo. Este dolor puede empeorar al flexionar la mueca y el codo durante mucho tiempo.  Dolor en la mueca que sube por el brazo hasta el hombro.  Dolor que baja hasta la palma de la mano o los dedos.  Debilidad en las manos. Puede resultarle difcil tomar y sostener objetos. Es posible que se sienta peor por la noche. Cmo se trata? El tratamiento de esta afeccin puede incluir:  Cambios en el estilo de vida. Se le pedir que deje o cambie la actividad que caus el  problema.  Hacer ejercicios y actividades para que los huesos, los msculos y los tendones se vuelvan ms fuertes (fisioterapia).  Aprender a usar la mano nuevamente (terapia ocupacional).  Medicamentos para el dolor y la hinchazn. Es posible que le apliquen inyecciones en la mueca.  Una frula o un dispositivo ortopdico para la mueca.  Ciruga. Siga estas instrucciones en su casa: Si tiene una frula o un dispositivo ortopdico:  Use la frula o el dispositivo ortopdico como se lo haya indicado el mdico. Quteselos solamente como se lo haya indicado el mdico.  Afloje la frula si los dedos: ? Hormiguean. ? Se adormecen. ? Se tornan fros y de color azul.  Mantenga la frula o el dispositivo ortopdico limpios.  Si la frula o el dispositivo ortopdico no son impermeables: ? No deje que se mojen. ? Cbralos con un envoltorio hermtico cuando tome un bao de inmersin o una ducha. Control del dolor, la rigidez y la hinchazn Si se lo indican, aplique hielo sobre la zona dolorida:  Si tiene un dispositivo ortopdico o una frula desmontable, quteselos como se lo haya indicado el mdico.  Ponga el hielo en una bolsa plstica.  Coloque una toalla entre la piel y la bolsa.  Coloque el hielo durante 20minutos, 2a3veces al da. No se quede dormido con la bolsa de hielo sobre la piel.  Retire el hielo si la piel se le pone de color rojo brillante. Esto es muy importante. Si no puede sentir dolor, calor o fro, tiene   un mayor riesgo de que se dae la zona. Mueva los dedos con frecuencia para reducir la rigidez y la hinchazn.   Instrucciones generales  Tome los medicamentos de venta libre y los recetados solamente como se lo haya indicado el mdico.  Descanse la mueca de cualquier actividad que le cause dolor. Si es necesario, hable con su jefe en el trabajo sobre los cambios que pueden ayudar a la curacin de la mueca.  Haga los ejercicios que le hayan indicado el  mdico, el fisioterapeuta o el terapeuta ocupacional.  Cumpla con todas las visitas de seguimiento. Comunquese con un mdico si:  Aparecen nuevos sntomas.  Los medicamentos no le alivian el dolor.  Sus sntomas empeoran. Solicite ayuda de inmediato si:  Tiene adormecimiento u hormigueo muy intensos en la mueca o la mano. Resumen  El sndrome del tnel carpiano es una afeccin que causa dolor en la mano y en el brazo.  Suele deberse a movimientos repetidos de la mueca.  Este problema se trata mediante cambios en el estilo de vida y medicamentos. La ciruga puede ser necesaria en casos muy graves.  Siga las instrucciones del mdico sobre el uso de una frula, el reposo de la mueca, la asistencia a las consultas de seguimiento y llamar para pedir ayuda. Esta informacin no tiene como fin reemplazar el consejo del mdico. Asegrese de hacerle al mdico cualquier pregunta que tenga. Document Revised: 04/11/2020 Document Reviewed: 04/11/2020 Elsevier Patient Education  2021 [employer]  

## 2021-03-12 NOTE — Progress Notes (Signed)
Stephanie Gibson 55 y.o.   Chief Complaint  Patient presents with  . Hand Pain    Right for a month  . Knee Pain    Right for a month    HISTORY OF PRESENT ILLNESS: This is a 55 y.o. female complaining of severe pain to right hand and right knee for several weeks. Denies injury but patient is right-handed and has very physical work. No other associated symptoms. Patient has a history of osteoarthritis. Concerned about possible carpal tunnel syndrome. No other complaints or medical concerns today. Rheumatology evaluation from 08/25/2021 as follows: Assessment / Plan:     Visit Diagnoses: Bilateral hand pain - Plan: XR Hand 2 View Right, XR Hand 2 View Left, Cyclic citrul peptide antibody, IgG, Rheumatoid factor, Sedimentation rate, C-reactive protein  Bilateral upper extremity and particularly hand pain which is worsening over the past few months.  Her shoulder pain sounds most consistent with impingement or musculotendinous strain as it is exacerbated with active range of motion and lying in bed overnight.  Hand pain with stiffness and swelling worst first thing in the morning improving with use could be consistent with inflammatory arthritis.  There is slight enlargement of the right fourth PIP compared with the left.  Some of her hand going to sleep in the morning also sound suggestive for carpal tunnel syndrome.  This would not usually explain the isolated joint enlargement but could deftly be contributing to total symptoms.  We will check hand x-rays today for any inflammatory changes as well as RA associated antibodies and inflammatory markers.  Also follow-up in a few weeks for clinical progress and revisit if any new testing or treatment are indicated.  Primary osteoarthritis involving multiple joints  Knee osteoarthritis as demonstrable by patellofemoral crepitus although normal range of motion.  She has pain when on her feet for long times but not significantly tender on  palpation.  She is currently taking oral diclofenac as needed for joint pain which is reasonable discussed option for topical therapy or alternatives if she noticed side effects such as stomach irritation with this.   HPI   Prior to Admission medications   Medication Sig Start Date End Date Taking? Authorizing Provider  albuterol (VENTOLIN HFA) 108 (90 Base) MCG/ACT inhaler Inhale into the lungs as needed.  02/16/19  Yes [provider]  amLODipine (NORVASC) 5 MG tablet Take 1 tablet (5 mg total) by mouth daily. 03/25/20  Yes Jahnyla Parrillo, Ines Bloomer, MD  hydrochlorothiazide (HYDRODIURIL) 25 MG tablet Take 1 tablet (25 mg total) by mouth daily. 03/25/20  Yes Jarrel Knoke, Ines Bloomer, MD  pantoprazole (PROTONIX) 40 MG tablet Take 1 tablet (40 mg total) by mouth daily. 07/28/20  Yes Serafino Burciaga, Ines Bloomer, MD  diclofenac (VOLTAREN) 75 MG EC tablet Take 1 tablet (75 mg total) by mouth 2 (two) times daily as needed. Patient not taking: Reported on 03/12/2021 07/28/20   Horald Pollen, MD  estradiol (ESTRACE) 1 MG tablet Take 1 tablet (1 mg total) by mouth daily. Patient not taking: Reported on 03/12/2021 03/25/20   Horald Pollen, MD  hydrOXYzine (VISTARIL) 100 MG capsule Take 1 capsule (100 mg total) by mouth at bedtime. Patient not taking: Reported on 03/12/2021 02/20/18   McVey, Gelene Mink, PA-C    No Known Allergies  Patient Active Problem List   Diagnosis Date Noted  . Bilateral hand pain 08/25/2020  . Primary osteoarthritis involving multiple joints 07/28/2020  . Arthritis 03/30/2019  . Seasonal allergies 03/30/2019  . Menopausal  symptom 05/28/2017  . Mild intermittent asthma without complication 38/18/2993  . Intramural leiomyoma of uterus 05/03/2014  . Overweight(278.02) 04/12/2014  . HTN (hypertension) 04/12/2014    Past Medical History:  Diagnosis Date  . Anemia   . Ectopic pregnancy   . Essential hypertension   . Leiomyoma of uterus   . Pneumonia   .  Shortness of breath dyspnea     Past Surgical History:  Procedure Laterality Date  . CESAREAN SECTION    . CYSTOSCOPY N/A 10/21/2015   Procedure: CYSTOSCOPY;  Surgeon: Terrance Mass, MD;  Location: [facility];  Service: Gynecology;  Laterality: N/A;  . LAPAROSCOPIC VAGINAL HYSTERECTOMY WITH SALPINGO OOPHORECTOMY N/A 10/21/2015   Procedure: LAPAROSCOPIC ASSISTED VAGINAL HYSTERECTOMY;  Surgeon: Terrance Mass, MD;  Location: [facility];  Service: Gynecology;  Laterality: N/A;  . OOPHORECTOMY    . SALPINGOOPHORECTOMY Left 10/21/2015   Procedure: SALPINGO OOPHORECTOMY;  Surgeon: Terrance Mass, MD;  Location: [facility];  Service: Gynecology;  Laterality: Left;  . TUBAL LIGATION      Social History   Socioeconomic History  . Marital status: Married    Spouse name: Not on file  . Number of children: Not on file  . Years of education: Not on file  . Highest education level: Not on file  Occupational History  . Not on file  Tobacco Use  . Smoking status: Never Smoker  . Smokeless tobacco: Never Used  Vaping Use  . Vaping Use: Never used  Substance and Sexual Activity  . Alcohol use: Yes    Alcohol/week: 0.0 standard drinks    Comment: occ  . Drug use: No  . Sexual activity: Yes    Birth control/protection: None    Comment: INTERCOURSE AGE 28, SEXUAL PARTNERS LESS THAN 5, current partner-  22 yrs   Other Topics Concern  . Not on file  Social History Narrative  . Not on file   Social Determinants of Health   Financial Resource Strain: Not on file  Food Insecurity: Not on file  Transportation Needs: Not on file  Physical Activity: Not on file  Stress: Not on file  Social Connections: Not on file  Intimate Partner Violence: Not on file    Family History  Problem Relation Age of Onset  . Hypertension Mother   . Arthritis Mother   . Cancer Father        THROAT   . Hypertension Sister   . Hypertension Brother   . Colon cancer Neg Hx      Review of Systems   Constitutional: Negative.  Negative for chills and fever.  HENT: Negative for congestion and sore throat.   Respiratory: Negative.  Negative for cough and shortness of breath.   Cardiovascular: Negative.  Negative for chest pain and palpitations.  Gastrointestinal: Negative.  Negative for abdominal pain, nausea and vomiting.  Genitourinary: Negative.  Negative for dysuria and hematuria.  Skin: Negative.  Negative for rash.  Neurological: Negative.  Negative for dizziness and headaches.  All other systems reviewed and are negative.  Today's Vitals   03/12/21 1610  BP: (!) 154/82  Pulse: 82  Temp: 98.4 F (36.9 C)  TempSrc: Oral  SpO2: 96%  Weight: 185 lb 6.4 oz (84.1 kg)  Height: 5\' 2"  (1.575 m)   Body mass index is 33.91 kg/m.   Physical Exam Vitals reviewed.  Constitutional:      Appearance: Normal appearance.  HENT:     Head: Normocephalic.  Eyes:  Extraocular Movements: Extraocular movements intact.     Pupils: Pupils are equal, round, and reactive to light.  Cardiovascular:     Rate and Rhythm: Normal rate.  Pulmonary:     Effort: Pulmonary effort is normal.  Musculoskeletal:     Cervical back: Normal range of motion.     Comments: Right hand: Positive tenderness to distal third and fourth metacarpal areas with painful flexion of fingers.  No erythema and no significant swelling. Right knee: No erythema.  Mild swelling and mild lateral tenderness.  Full range of motion.  Stable in flexion and extension.  Skin:    General: Skin is warm and dry.     Capillary Refill: Capillary refill takes less than 2 seconds.  Neurological:     General: No focal deficit present.     Mental Status: She is alert and oriented to person, place, and time.  Psychiatric:        Mood and Affect: Mood normal.        Behavior: Behavior normal.      ASSESSMENT & PLAN: Nilda was seen today for hand pain and knee pain.  Diagnoses and all orders for this visit:  Right hand  pain Comments: Suspect carpal tunnel syndrome Orders: -     diclofenac (VOLTAREN) 75 MG EC tablet; Take 1 tablet (75 mg total) by mouth 2 (two) times daily. -     Ambulatory referral to Orthopedic Surgery  Primary osteoarthritis involving multiple joints     Patient Instructions   Sndrome del tnel carpiano Carpal Tunnel Syndrome  El sndrome del tnel carpiano es una afeccin que causa dolor, debilidad y adormecimiento en la mano y los dedos. El adormecimiento es cuando no siente una zona del cuerpo. El tnel carpiano es un rea estrecha que se encuentra en el lado palmar de la [place]. Los movimientos repetidos de la mueca o determinadas enfermedades pueden causar hinchazn en el tnel. Esta hinchazn puede comprimir el nervio principal de la [place]. Este nervio se llama "nervio mediano". Cules son las causas? Esta afeccin puede ser causada por lo siguiente:  Mover la mano y la [place] y otra vez mientras realiza una tarea.  Lesin en la [place].  Artritis.  Un saco lleno de lquido (quiste) o un crecimiento anormal (tumor) en el tnel carpiano.  Acumulacin de lquido Solicitor.  Uso de herramientas que vibran. Algunas veces, la causa no se conoce. Qu incrementa el riesgo? Los siguientes factores pueden hacer que sea ms propenso a Best boy esta afeccin:  Tener un trabajo en el que deba hacer estas cosas: ? Mover la mano una y otra vez. ? Fish farm manager con herramientas que vibran, como taladros o lijadoras.  Ser mujer.  Tener diabetes, obesidad, problemas de tiroides o insuficiencia renal. Cules son los signos o sntomas? Los sntomas de esta afeccin incluyen:  Sensacin de hormigueo en los dedos.  Hormigueo o prdida de la sensibilidad de Insurance risk surveyor.  Dolor en todo el brazo. Este dolor puede empeorar al flexionar la [place] y el codo durante [place].  Dolor en la mueca que sube por el brazo hasta el hombro.  Dolor que baja hasta la palma de la  mano o los dedos.  Debilidad en las manos. Puede resultarle difcil tomar y Licensed conveyancer. Es posible que se sienta peor por la noche. Cmo se trata? El tratamiento de esta afeccin puede incluir:  Cambios en el estilo de vida. Se le pedir que deje o cambie la [employer]  caus el problema.  Hacer ejercicios y actividades para que los [place], los msculos y los tendones se vuelvan ms fuertes (fisioterapia).  Aprender a Control and instrumentation engineer nuevamente (terapia ocupacional).  Medicamentos para Conservation officer, historic buildings y la hinchazn. Es posible que le apliquen inyecciones en la [place].  Una frula o un dispositivo ortopdico para la [place].  Ciruga. Siga estas instrucciones en su casa: Si tiene una frula o un dispositivo ortopdico:  Use la frula o el dispositivo ortopdico como se lo haya indicado el mdico. Quteselos solamente como se lo haya indicado el mdico.  Afloje la frula si los dedos: ? Hormiguean. ? Se adormecen. ? Se tornan fros y de [employer].  Mantenga la frula o el dispositivo ortopdico limpios.  Si la frula o el dispositivo ortopdico no son impermeables: ? No deje que se mojen. ? Cbralos con un envoltorio hermtico cuando tome un bao de inmersin o una ducha. Control del dolor, la rigidez y la hinchazn Si se lo indican, aplique hielo sobre la zona dolorida:  Si tiene un dispositivo ortopdico o una frula desmontable, quteselos como se lo haya indicado el mdico.  Ponga el hielo en una bolsa plstica.  Coloque una toalla entre la piel y Therapist, nutritional.  Coloque el hielo durante 63minutos, 2a3veces al da. No se quede dormido con la bolsa de hielo [employer].  Retire el hielo si la piel se le pone de color rojo brillante. Esto es [employer]. Si no puede sentir dolor, calor o fro, tiene un mayor riesgo de que se dae la zona. Mueva los dedos con frecuencia para reducir la rigidez y la hinchazn.   Instrucciones generales  [employer] de venta  libre y los recetados solamente como se lo haya indicado el mdico.  Descanse la [place] de cualquier actividad que le cause dolor. Si es necesario, hable con su jefe en el [employer] cambios que pueden ayudar a la curacin de la [place].  Haga los ejercicios que le hayan indicado el mdico, el fisioterapeuta o el terapeuta ocupacional.  Cumpla con todas las visitas de seguimiento. Comunquese con un mdico si:  Aparecen nuevos sntomas.  Los medicamentos no Forensic psychologist.  Sus sntomas empeoran. Solicite ayuda de inmediato si:  Tiene adormecimiento u hormigueo muy intensos en la mueca o la [place]. Resumen  El sndrome del tnel carpiano es una afeccin que causa dolor en la mano y en el brazo.  Suele deberse a movimientos repetidos de Advertising copywriter.  Este problema se trata mediante cambios en el estilo de vida y medicamentos. La ciruga puede ser necesaria en casos muy graves.  Siga las instrucciones del mdico sobre el uso de una frula, el reposo de la [place], la asistencia a las consultas de seguimiento y Solicitor para pedir ayuda. Esta informacin no tiene Marine scientist el consejo del mdico. Asegrese de hacerle al mdico cualquier pregunta que tenga. Document Revised: 04/11/2020 Document Reviewed: 04/11/2020 Elsevier Patient Education  2021 [place], MD [facility]

## 2021-03-20 ENCOUNTER — Ambulatory Visit (INDEPENDENT_AMBULATORY_CARE_PROVIDER_SITE_OTHER): Payer: 59 | Admitting: Orthopaedic Surgery

## 2021-03-20 ENCOUNTER — Ambulatory Visit (INDEPENDENT_AMBULATORY_CARE_PROVIDER_SITE_OTHER): Payer: 59

## 2021-03-20 ENCOUNTER — Encounter: Payer: Self-pay | Admitting: Orthopaedic Surgery

## 2021-03-20 DIAGNOSIS — M1711 Unilateral primary osteoarthritis, right knee: Secondary | ICD-10-CM | POA: Diagnosis not present

## 2021-03-20 DIAGNOSIS — M65341 Trigger finger, right ring finger: Secondary | ICD-10-CM | POA: Diagnosis not present

## 2021-03-20 MED ORDER — METHYLPREDNISOLONE ACETATE 40 MG/ML IJ SUSP
13.3300 mg | INTRAMUSCULAR | Status: AC | PRN
  Administered 2021-03-20: 13.33 mg

## 2021-03-20 MED ORDER — BUPIVACAINE HCL 0.25 % IJ SOLN
0.3300 mL | INTRAMUSCULAR | Status: AC | PRN
  Administered 2021-03-20: .33 mL

## 2021-03-20 MED ORDER — LIDOCAINE HCL 1 % IJ SOLN
2.0000 mL | INTRAMUSCULAR | Status: AC | PRN
  Administered 2021-03-20: 2 mL

## 2021-03-20 MED ORDER — BUPIVACAINE HCL 0.25 % IJ SOLN
2.0000 mL | INTRAMUSCULAR | Status: AC | PRN
  Administered 2021-03-20: 2 mL via INTRA_ARTICULAR

## 2021-03-20 MED ORDER — LIDOCAINE HCL 1 % IJ SOLN
1.0000 mL | INTRAMUSCULAR | Status: AC | PRN
Start: 2021-03-20 — End: 2021-03-20
  Administered 2021-03-20: 1 mL

## 2021-03-20 MED ORDER — METHYLPREDNISOLONE ACETATE 40 MG/ML IJ SUSP
40.0000 mg | INTRAMUSCULAR | Status: AC | PRN
  Administered 2021-03-20: 40 mg via INTRA_ARTICULAR

## 2021-03-20 NOTE — Progress Notes (Signed)
Office Visit Note   Patient: Stephanie Gibson           Date of Birth: November 25, 1965           MRN: [id number] Visit Date: 03/20/2021              Requested by: Horald Pollen, [place],  [place] [address] PCP: Horald Pollen, MD   Assessment & Plan: Visit Diagnoses:  1. Primary osteoarthritis of right knee   2. Trigger finger, right ring finger     Plan: Impression is right knee arthritis flareup and right hand carpal tunnel syndrome in addition to right ring finger flexor tenosynovitis.  In regards to the knee, we have discussed cortisone injection today for which she would like to proceed.  In regards to the carpal tunnel syndrome, we will provide her with a removable wrist splints to wear at night as well as referral to Dr. Ernestina Patches for nerve conduction study/EMG.  Follow-up with [place] once has been completed.  In regards to the ring finger flexor tenosynovitis, we have discussed cortisone injection as well as a splint to wear at night for the next week.    Follow-Up Instructions: Return if symptoms worsen or fail to improve.   Orders:  Orders Placed This Encounter  Procedures  . Large Joint Inj  . Hand/UE Inj  . XR KNEE 3 VIEW RIGHT   No orders of the defined types were placed in this encounter.     Procedures: Large Joint Inj: R knee on 03/20/2021 11:08 AM Indications: pain Details: 22 G needle, anterolateral approach Medications: 2 mL lidocaine 1 %; 2 mL bupivacaine 0.25 %; 40 mg methylPREDNISolone acetate 40 MG/ML  Hand/UE Inj: R ring A1 for trigger finger on 03/20/2021 11:08 AM Indications: pain Details: 25 G needle Medications: 1 mL lidocaine 1 %; 0.33 mL bupivacaine 0.25 %; 13.33 mg methylPREDNISolone acetate 40 MG/ML      Clinical Data: No additional findings.   Subjective: Chief Complaint  Patient presents with  . Right Hand - Pain  . Right Knee - Pain    HPI patient is a 55 year old Spanish-speaking female who is  here today with interpreter.  She is here for right knee and right hand pain.  In regards to her right hand, the pain is primarily to the A1 pulley of the ring finger.  She has had this for several months.  She notes occasional stiffness to the finger as well worse in the morning.  She has been taking diclofenac without significant relief.  She also notes tingling to the ring finger as well as the palm of her hand.  She does occasionally wake up at night shaking her hands.  She was recently worked up for autoimmune disease which was negative.  No previous nerve conduction study.  In regards to her right knee, she has had pain to the medial aspect for the past month.  No known injury or change in activity.  Pain is worse with kneeling as well as with stair climbing.  The diclofenac does not significantly help.  No previous cortisone injection to her right knee.  Review of Systems   Objective: Vital Signs: LMP 08/10/2015 (Approximate) Comment: continuous bleeding/spotting for months  Physical Exam  Ortho Exam  Specialty Comments:  No specialty comments available.  Imaging: XR KNEE 3 VIEW RIGHT  Result Date: 03/20/2021 Mild to moderate tricompartmental degenerative changes    PMFS History: Patient Active Problem List   Diagnosis  Date Noted  . Bilateral hand pain 08/25/2020  . Primary osteoarthritis involving multiple joints 07/28/2020  . Arthritis 03/30/2019  . Seasonal allergies 03/30/2019  . Menopausal symptom 05/28/2017  . Mild intermittent asthma without complication 70/26/3785  . Intramural leiomyoma of uterus 05/03/2014  . Overweight(278.02) 04/12/2014  . HTN (hypertension) 04/12/2014   Past Medical History:  Diagnosis Date  . Anemia   . Ectopic pregnancy   . Essential hypertension   . Leiomyoma of uterus   . Pneumonia   . Shortness of breath dyspnea     Family History  Problem Relation Age of Onset  . Hypertension Mother   . Arthritis Mother   . Cancer Father         THROAT   . Hypertension Sister   . Hypertension Brother   . Colon cancer Neg Hx     Past Surgical History:  Procedure Laterality Date  . CESAREAN SECTION    . CYSTOSCOPY N/A 10/21/2015   Procedure: CYSTOSCOPY;  Surgeon: Terrance Mass, MD;  Location: [facility];  Service: Gynecology;  Laterality: N/A;  . LAPAROSCOPIC VAGINAL HYSTERECTOMY WITH SALPINGO OOPHORECTOMY N/A 10/21/2015   Procedure: LAPAROSCOPIC ASSISTED VAGINAL HYSTERECTOMY;  Surgeon: Terrance Mass, MD;  Location: [facility];  Service: Gynecology;  Laterality: N/A;  . OOPHORECTOMY    . SALPINGOOPHORECTOMY Left 10/21/2015   Procedure: SALPINGO OOPHORECTOMY;  Surgeon: Terrance Mass, MD;  Location: [facility];  Service: Gynecology;  Laterality: Left;  . TUBAL LIGATION     Social History   Occupational History  . Not on file  Tobacco Use  . Smoking status: Never Smoker  . Smokeless tobacco: Never Used  Vaping Use  . Vaping Use: Never used  Substance and Sexual Activity  . Alcohol use: Yes    Alcohol/week: 0.0 standard drinks    Comment: occ  . Drug use: No  . Sexual activity: Yes    Birth control/protection: None    Comment: INTERCOURSE AGE 89, SEXUAL PARTNERS LESS THAN 5, current partner-  22 yrs

## 2021-05-18 ENCOUNTER — Telehealth: Payer: Self-pay

## 2021-05-18 NOTE — Telephone Encounter (Signed)
When would you like to R/S to? Can you give me some options-time & date?

## 2021-05-18 NOTE — Telephone Encounter (Signed)
Pt called and cant make the 15th appt and needs a CB regarding a different day. Please advise

## 2021-05-22 ENCOUNTER — Encounter: Payer: 59 | Admitting: Physical Medicine and Rehabilitation

## 2021-07-10 ENCOUNTER — Telehealth: Payer: Self-pay | Admitting: Physical Medicine and Rehabilitation

## 2021-07-10 NOTE — Telephone Encounter (Signed)
Pt's husband called requesting to reschedule appt. Please call to reschedule. Phone number is [phone]

## 2021-07-17 ENCOUNTER — Encounter: Payer: Self-pay | Admitting: Physical Medicine and Rehabilitation

## 2021-07-17 ENCOUNTER — Other Ambulatory Visit: Payer: Self-pay

## 2021-07-17 ENCOUNTER — Ambulatory Visit (INDEPENDENT_AMBULATORY_CARE_PROVIDER_SITE_OTHER): Payer: 59 | Admitting: Physical Medicine and Rehabilitation

## 2021-07-17 DIAGNOSIS — R202 Paresthesia of skin: Secondary | ICD-10-CM | POA: Diagnosis not present

## 2021-07-17 NOTE — Progress Notes (Signed)
Pain in right hand and forearm. Difficulty bending fingers. Right hand dominant +Lotion

## 2021-07-17 NOTE — Progress Notes (Signed)
Stephanie Gibson - 55 y.o. female MRN [id number]  Date of birth: 1966-06-23  Office Visit Note: Visit Date: 07/17/2021 PCP: Horald Pollen, MD Referred by: Horald Pollen, *  Subjective: Chief Complaint  Patient presents with   Right Hand - Pain   HPI:  Stephanie Gibson is a 55 y.o. female who comes in today at the request of Dr. [name] for electrodiagnostic study of the Right upper extremities.  Patient is Right hand dominant.  She reports mainly pain in the right hand and forearm with difficulty bending fingers.  She does get some nocturnal complaints.  No frank radicular symptoms.   ROS Otherwise per HPI.  Assessment & Plan: Visit Diagnoses:    ICD-10-CM   1. Paresthesia of skin  R20.2 NCV with EMG (electromyography)      Plan: Impression: The above electrodiagnostic study is ABNORMAL and reveals evidence of a mild right median nerve entrapment at the wrist (carpal tunnel syndrome) affecting sensory components. There is no significant electrodiagnostic evidence of any other focal nerve entrapment, brachial plexopathy or cervical radiculopathy.   Recommendations: 1.  Follow-up with referring physician. 2.  Continue current management of symptoms. 3.  Continue use of resting splint at night-time and as needed during the day.  Meds & Orders: No orders of the defined types were placed in this encounter.   Orders Placed This Encounter  Procedures   NCV with EMG (electromyography)    Follow-up: Return for [name], MD.   Procedures: No procedures performed  EMG & NCV Findings: Evaluation of the right median motor nerve showed reduced amplitude (4.9 mV).  The right median (across palm) sensory nerve showed prolonged distal peak latency (Wrist, 3.8 ms).  All remaining nerves (as indicated in the following tables) were within normal limits.    Impression: The above electrodiagnostic study is ABNORMAL and reveals evidence of a mild right median nerve  entrapment at the wrist (carpal tunnel syndrome) affecting sensory components. There is no significant electrodiagnostic evidence of any other focal nerve entrapment, brachial plexopathy or cervical radiculopathy.   Recommendations: 1.  Follow-up with referring physician. 2.  Continue current management of symptoms. 3.  Continue use of resting splint at night-time and as needed during the day.  ___________________________ [name] Board Certified, American Board of Physical Medicine and Rehabilitation    Nerve Conduction Studies Anti Sensory Summary Table   Stim Site NR Peak (ms) Norm Peak (ms) P-T Amp (V) Norm P-T Amp Site1 Site2 Delta-P (ms) Dist (cm) Vel (m/s) Norm Vel (m/s)  Right Median Acr Palm Anti Sensory (2nd Digit)  30C  Wrist    *3.8 <3.6 26.9 >10 Wrist Palm 2.0 0.0    Palm    1.8 <2.0 32.7         Right Radial Anti Sensory (Base 1st Digit)  30.7C  Wrist    2.0 <3.1 48.3  Wrist Base 1st Digit 2.0 0.0    Right Ulnar Anti Sensory (5th Digit)  30.4C  Wrist    3.3 <3.7 33.6 >15.0 Wrist 5th Digit 3.3 14.0 42 >38   Motor Summary Table   Stim Site NR Onset (ms) Norm Onset (ms) O-P Amp (mV) Norm O-P Amp Site1 Site2 Delta-0 (ms) Dist (cm) Vel (m/s) Norm Vel (m/s)  Right Median Motor (Abd Poll Brev)  30.9C  Wrist    3.8 <4.2 *4.9 >5 Elbow Wrist 3.7 20.0 54 >50  Elbow    7.5  4.7  Right Ulnar Motor (Abd Dig Min)  31C  Wrist    2.9 <4.2 10.2 >3 B Elbow Wrist 3.4 20.5 60 >53  B Elbow    6.3  10.0  A Elbow B Elbow 1.1 10.0 91 >53  A Elbow    7.4  9.8           Nerve Conduction Studies Anti Sensory Left/Right Comparison   Stim Site L Lat (ms) R Lat (ms) L-R Lat (ms) L Amp (V) R Amp (V) L-R Amp (%) Site1 Site2 L Vel (m/s) R Vel (m/s) L-R Vel (m/s)  Median Acr Palm Anti Sensory (2nd Digit)  30C  Wrist  *3.8   26.9  Wrist Palm     Palm  1.8   32.7        Radial Anti Sensory (Base 1st Digit)  30.7C  Wrist  2.0   48.3  Wrist Base 1st Digit     Ulnar  Anti Sensory (5th Digit)  30.4C  Wrist  3.3   33.6  Wrist 5th Digit  42    Motor Left/Right Comparison   Stim Site L Lat (ms) R Lat (ms) L-R Lat (ms) L Amp (mV) R Amp (mV) L-R Amp (%) Site1 Site2 L Vel (m/s) R Vel (m/s) L-R Vel (m/s)  Median Motor (Abd Poll Brev)  30.9C  Wrist  3.8   *4.9  Elbow Wrist  54   Elbow  7.5   4.7        Ulnar Motor (Abd Dig Min)  31C  Wrist  2.9   10.2  B Elbow Wrist  60   B Elbow  6.3   10.0  A Elbow B Elbow  91   A Elbow  7.4   9.8           Waveforms:            Clinical History: No specialty comments available.     Objective:  VS:  HT:    WT:   BMI:     BP:   HR: bpm  TEMP: ( )  RESP:  Physical Exam Musculoskeletal:        General: No swelling, tenderness or deformity.     Comments: Inspection reveals no atrophy of the bilateral APB or FDI or hand intrinsics. There is no swelling, color changes, allodynia or dystrophic changes. There is 5 out of 5 strength in the bilateral wrist extension, finger abduction and long finger flexion. There is intact sensation to light touch in all dermatomal and peripheral nerve distributions.  There is a negative Hoffmann's test bilaterally.  Skin:    General: Skin is warm and dry.     Findings: No erythema or rash.  Neurological:     General: No focal deficit present.     Mental Status: She is alert and oriented to person, place, and time.     Motor: No weakness or abnormal muscle tone.     Coordination: Coordination normal.  Psychiatric:        Mood and Affect: Mood normal.        Behavior: Behavior normal.     Imaging: No results found.

## 2021-07-22 NOTE — Procedures (Signed)
EMG & NCV Findings: Evaluation of the right median motor nerve showed reduced amplitude (4.9 mV).  The right median (across palm) sensory nerve showed prolonged distal peak latency (Wrist, 3.8 ms).  All remaining nerves (as indicated in the following tables) were within normal limits.    Impression: The above electrodiagnostic study is ABNORMAL and reveals evidence of a mild right median nerve entrapment at the wrist (carpal tunnel syndrome) affecting sensory components. There is no significant electrodiagnostic evidence of any other focal nerve entrapment, brachial plexopathy or cervical radiculopathy.   Recommendations: 1.  Follow-up with referring physician. 2.  Continue current management of symptoms. 3.  Continue use of resting splint at night-time and as needed during the day.  ___________________________ [name] Board Certified, American Board of Physical Medicine and Rehabilitation    Nerve Conduction Studies Anti Sensory Summary Table   Stim Site NR Peak (ms) Norm Peak (ms) P-T Amp (V) Norm P-T Amp Site1 Site2 Delta-P (ms) Dist (cm) Vel (m/s) Norm Vel (m/s)  Right Median Acr Palm Anti Sensory (2nd Digit)  30C  Wrist    *3.8 <3.6 26.9 >10 Wrist Palm 2.0 0.0    Palm    1.8 <2.0 32.7         Right Radial Anti Sensory (Base 1st Digit)  30.7C  Wrist    2.0 <3.1 48.3  Wrist Base 1st Digit 2.0 0.0    Right Ulnar Anti Sensory (5th Digit)  30.4C  Wrist    3.3 <3.7 33.6 >15.0 Wrist 5th Digit 3.3 14.0 42 >38   Motor Summary Table   Stim Site NR Onset (ms) Norm Onset (ms) O-P Amp (mV) Norm O-P Amp Site1 Site2 Delta-0 (ms) Dist (cm) Vel (m/s) Norm Vel (m/s)  Right Median Motor (Abd Poll Brev)  30.9C  Wrist    3.8 <4.2 *4.9 >5 Elbow Wrist 3.7 20.0 54 >50  Elbow    7.5  4.7         Right Ulnar Motor (Abd Dig Min)  31C  Wrist    2.9 <4.2 10.2 >3 B Elbow Wrist 3.4 20.5 60 >53  B Elbow    6.3  10.0  A Elbow B Elbow 1.1 10.0 91 >53  A Elbow    7.4  9.8           Nerve  Conduction Studies Anti Sensory Left/Right Comparison   Stim Site L Lat (ms) R Lat (ms) L-R Lat (ms) L Amp (V) R Amp (V) L-R Amp (%) Site1 Site2 L Vel (m/s) R Vel (m/s) L-R Vel (m/s)  Median Acr Palm Anti Sensory (2nd Digit)  30C  Wrist  *3.8   26.9  Wrist Palm     Palm  1.8   32.7        Radial Anti Sensory (Base 1st Digit)  30.7C  Wrist  2.0   48.3  Wrist Base 1st Digit     Ulnar Anti Sensory (5th Digit)  30.4C  Wrist  3.3   33.6  Wrist 5th Digit  42    Motor Left/Right Comparison   Stim Site L Lat (ms) R Lat (ms) L-R Lat (ms) L Amp (mV) R Amp (mV) L-R Amp (%) Site1 Site2 L Vel (m/s) R Vel (m/s) L-R Vel (m/s)  Median Motor (Abd Poll Brev)  30.9C  Wrist  3.8   *4.9  Elbow Wrist  54   Elbow  7.5   4.7        Ulnar Motor (Abd Dig  Min)  31C  Wrist  2.9   10.2  B Elbow Wrist  60   B Elbow  6.3   10.0  A Elbow B Elbow  91   A Elbow  7.4   9.8           Waveforms:

## 2021-07-28 ENCOUNTER — Encounter: Payer: Self-pay | Admitting: Orthopaedic Surgery

## 2021-07-28 ENCOUNTER — Ambulatory Visit (INDEPENDENT_AMBULATORY_CARE_PROVIDER_SITE_OTHER): Payer: 59 | Admitting: Orthopaedic Surgery

## 2021-07-28 ENCOUNTER — Other Ambulatory Visit: Payer: Self-pay

## 2021-07-28 DIAGNOSIS — M65341 Trigger finger, right ring finger: Secondary | ICD-10-CM

## 2021-07-28 DIAGNOSIS — G5601 Carpal tunnel syndrome, right upper limb: Secondary | ICD-10-CM | POA: Diagnosis not present

## 2021-07-28 MED ORDER — LIDOCAINE HCL 1 % IJ SOLN
1.0000 mL | INTRAMUSCULAR | Status: AC | PRN
  Administered 2021-07-28: 1 mL

## 2021-07-28 MED ORDER — METHYLPREDNISOLONE ACETATE 40 MG/ML IJ SUSP
1.0000 mg | INTRAMUSCULAR | Status: AC | PRN
  Administered 2021-07-28: 1 mg

## 2021-07-28 MED ORDER — BUPIVACAINE HCL 0.25 % IJ SOLN
1.0000 mL | INTRAMUSCULAR | Status: AC | PRN
  Administered 2021-07-28: 1 mL

## 2021-07-28 NOTE — Progress Notes (Signed)
Office Visit Note   Patient: Stephanie Gibson           Date of Birth: 05/12/1966           MRN: [id number] Visit Date: 07/28/2021              Requested by: Horald Pollen, [place],  [place] [id number] PCP: Horald Pollen, MD   Assessment & Plan: Visit Diagnoses:  1. Carpal tunnel syndrome, right upper limb   2. Trigger finger, right ring finger     Plan: Impression is recurrent right ring trigger finger in addition to mild right carpal tunnel syndrome.  We have discussed repeat injection to the trigger finger in addition to carpal tunnel injection.  She would like to hold off on the trigger finger injection for now and just proceed with a carpal tunnel injection.  She will follow-up with [place] as needed.  Follow-Up Instructions: Return if symptoms worsen or fail to improve.   Orders:  Orders Placed This Encounter  Procedures   Hand/UE Inj: R carpal tunnel    No orders of the defined types were placed in this encounter.     Procedures: Hand/UE Inj: R carpal tunnel for carpal tunnel syndrome on 07/28/2021 4:10 PM Indications: pain Details: 25 G needle, volar approach Medications: 1 mL lidocaine 1 %; 1 mL bupivacaine 0.25 %; 1 mg methylPREDNISolone acetate 40 MG/ML     Clinical Data: No additional findings.   Subjective: Chief Complaint  Patient presents with   Right Hand - Follow-up    HPI patient is a pleasant 55 year old Spanish-speaking female who comes in today to discuss nerve conduction studies right upper extremity.  Nerve conduction studies from 07/17/2021 showed a mild right median nerve compression.  Patient has not previously undergone carpal tunnel injection.  She is also complaining of recurrent right ring trigger finger.  This was injected in May of this year which did help until last month.  Review of Systems as detailed in HPI.  All others reviewed and are negative.   Objective: Vital Signs: LMP 08/10/2015  (Approximate) Comment: continuous bleeding/spotting for months  Physical Exam well-developed well-nourished female no acute distress.  Alert and oriented x3.  Ortho Exam right hand exam shows moderate tenderness to the A1 pulley of the right ring finger.  She does have a positive Phalen and positive Tinel.  She is neurovascular intact distally.  Specialty Comments:  No specialty comments available.  Imaging: No new imaging   PMFS History: Patient Active Problem List   Diagnosis Date Noted   Bilateral hand pain 08/25/2020   Primary osteoarthritis involving multiple joints 07/28/2020   Arthritis 03/30/2019   Seasonal allergies 03/30/2019   Menopausal symptom 05/28/2017   Mild intermittent asthma without complication 68/10/7516   Intramural leiomyoma of uterus 05/03/2014   Overweight(278.02) 04/12/2014   HTN (hypertension) 04/12/2014   Past Medical History:  Diagnosis Date   Anemia    Ectopic pregnancy    Essential hypertension    Leiomyoma of uterus    Pneumonia    Shortness of breath dyspnea     Family History  Problem Relation Age of Onset   Hypertension Mother    Arthritis Mother    Cancer Father        THROAT    Hypertension Sister    Hypertension Brother    Colon cancer Neg Hx     Past Surgical History:  Procedure Laterality Date   CESAREAN SECTION  CYSTOSCOPY N/A 10/21/2015   Procedure: CYSTOSCOPY;  Surgeon: Terrance Mass, MD;  Location: [facility];  Service: Gynecology;  Laterality: N/A;   LAPAROSCOPIC VAGINAL HYSTERECTOMY WITH SALPINGO OOPHORECTOMY N/A 10/21/2015   Procedure: LAPAROSCOPIC ASSISTED VAGINAL HYSTERECTOMY;  Surgeon: Terrance Mass, MD;  Location: [facility];  Service: Gynecology;  Laterality: N/A;   OOPHORECTOMY     SALPINGOOPHORECTOMY Left 10/21/2015   Procedure: SALPINGO OOPHORECTOMY;  Surgeon: Terrance Mass, MD;  Location: [facility];  Service: Gynecology;  Laterality: Left;   TUBAL LIGATION     Social History   Occupational History    Not on file  Tobacco Use   Smoking status: Never   Smokeless tobacco: Never  Vaping Use   Vaping Use: Never used  Substance and Sexual Activity   Alcohol use: Yes    Alcohol/week: 0.0 standard drinks    Comment: occ   Drug use: No   Sexual activity: Yes    Birth control/protection: None    Comment: INTERCOURSE AGE 82, SEXUAL PARTNERS LESS THAN 5, current partner-  22 yrs

## 2021-09-01 ENCOUNTER — Emergency Department (HOSPITAL_COMMUNITY)
Admission: EM | Admit: 2021-09-01 | Discharge: 2021-09-02 | Disposition: A | Discharge status: Home / Self Care | Payer: 59 | Attending: Emergency Medicine | Admitting: Emergency Medicine

## 2021-09-01 ENCOUNTER — Encounter (HOSPITAL_COMMUNITY): Payer: Self-pay

## 2021-09-01 ENCOUNTER — Other Ambulatory Visit: Payer: Self-pay

## 2021-09-01 DIAGNOSIS — H6123 Impacted cerumen, bilateral: Secondary | ICD-10-CM | POA: Diagnosis not present

## 2021-09-01 DIAGNOSIS — H6120 Impacted cerumen, unspecified ear: Secondary | ICD-10-CM

## 2021-09-01 DIAGNOSIS — T161XXA Foreign body in right ear, initial encounter: Secondary | ICD-10-CM | POA: Insufficient documentation

## 2021-09-01 DIAGNOSIS — J45909 Unspecified asthma, uncomplicated: Secondary | ICD-10-CM | POA: Insufficient documentation

## 2021-09-01 DIAGNOSIS — X58XXXA Exposure to other specified factors, initial encounter: Secondary | ICD-10-CM | POA: Insufficient documentation

## 2021-09-01 DIAGNOSIS — I10 Essential (primary) hypertension: Secondary | ICD-10-CM | POA: Diagnosis not present

## 2021-09-01 DIAGNOSIS — Z79899 Other long term (current) drug therapy: Secondary | ICD-10-CM | POA: Diagnosis not present

## 2021-09-01 NOTE — ED Triage Notes (Signed)
Pt complains of foreign body in right ear x 1 day. Pt states that she felt something drop into her ear while she was sleeping. Pt complains of minimal pain.

## 2021-09-02 MED ORDER — HYDROGEN PEROXIDE 3 % EX SOLN
CUTANEOUS | Status: AC
  Filled 2021-09-02: qty 473

## 2021-09-02 NOTE — Discharge Instructions (Addendum)
Follow-up with your family doctor 

## 2021-09-02 NOTE — ED Provider Notes (Signed)
[facility] DEPT Provider Note   CSN: [id number] Arrival date & time: 09/01/21  1928     History Chief Complaint  Patient presents with   Foreign Body in [place] is a 55 y.o. female.  55 yo F with a chief complaints of a foreign body to the right ear.  She feels like something fell into it a couple nights ago and feels like it is moving around.  She has not tried anything to try and help with this at home.  Denies any fevers or chills.  Denies any ear pain.  She tells me that her husband looked in there and thinks that he saw something.  The history is provided by the patient and the spouse. The history is limited by a language barrier. A language interpreter was used.  Foreign Body in Ear This is a new problem. The current episode started 2 days ago. The problem occurs constantly. The problem has not changed since onset.Pertinent negatives include no chest pain, no headaches and no shortness of breath. Nothing aggravates the symptoms. Nothing relieves the symptoms. She has tried nothing for the symptoms. The treatment provided no relief.      Past Medical History:  Diagnosis Date   Anemia    Ectopic pregnancy    Essential hypertension    Leiomyoma of uterus    Pneumonia    Shortness of breath dyspnea     Patient Active Problem List   Diagnosis Date Noted   Bilateral hand pain 08/25/2020   Primary osteoarthritis involving multiple joints 07/28/2020   Arthritis 03/30/2019   Seasonal allergies 03/30/2019   Menopausal symptom 05/28/2017   Mild intermittent asthma without complication 96/75/9163   Intramural leiomyoma of uterus 05/03/2014   Overweight(278.02) 04/12/2014   HTN (hypertension) 04/12/2014    Past Surgical History:  Procedure Laterality Date   CESAREAN SECTION     CYSTOSCOPY N/A 10/21/2015   Procedure: CYSTOSCOPY;  Surgeon: Terrance Mass, MD;  Location: [facility];  Service: Gynecology;  Laterality: N/A;    LAPAROSCOPIC VAGINAL HYSTERECTOMY WITH SALPINGO OOPHORECTOMY N/A 10/21/2015   Procedure: LAPAROSCOPIC ASSISTED VAGINAL HYSTERECTOMY;  Surgeon: Terrance Mass, MD;  Location: [facility];  Service: Gynecology;  Laterality: N/A;   OOPHORECTOMY     SALPINGOOPHORECTOMY Left 10/21/2015   Procedure: SALPINGO OOPHORECTOMY;  Surgeon: Terrance Mass, MD;  Location: [facility];  Service: Gynecology;  Laterality: Left;   TUBAL LIGATION       OB History     Gravida  4   Para  2   Term      Preterm      AB  2   Living  2      SAB  1   IAB      Ectopic  1   Multiple      Live Births              Family History  Problem Relation Age of Onset   Hypertension Mother    Arthritis Mother    Cancer Father        THROAT    Hypertension Sister    Hypertension Brother    Colon cancer Neg Hx     Social History   Tobacco Use   Smoking status: Never   Smokeless tobacco: Never  Vaping Use   Vaping Use: Never used  Substance Use Topics   Alcohol use: Yes    Alcohol/week: 0.0 standard drinks  Comment: occ   Drug use: No    Home Medications Prior to Admission medications   Medication Sig Start Date End Date Taking? Authorizing Provider  amLODipine (NORVASC) 5 MG tablet Take 1 tablet (5 mg total) by mouth daily. 03/25/20  Yes Sagardia, Ines Bloomer, MD  hydrochlorothiazide (HYDRODIURIL) 25 MG tablet Take 1 tablet (25 mg total) by mouth daily. 03/25/20  Yes Sagardia, Ines Bloomer, MD  albuterol (VENTOLIN HFA) 108 (90 Base) MCG/ACT inhaler Inhale 1-2 puffs into the lungs every 6 (six) hours as needed. 02/16/19   [provider]  diclofenac (VOLTAREN) 75 MG EC tablet Take 1 tablet (75 mg total) by mouth 2 (two) times daily. Patient not taking: Reported on 09/01/2021 03/12/21   Horald Pollen, MD  estradiol (ESTRACE) 1 MG tablet Take 1 tablet (1 mg total) by mouth daily. Patient not taking: No sig reported 03/25/20   Horald Pollen, MD  hydrOXYzine (VISTARIL) 100 MG  capsule Take 1 capsule (100 mg total) by mouth at bedtime. Patient not taking: No sig reported 02/20/18   McVey, Gelene Mink, PA-C  pantoprazole (PROTONIX) 40 MG tablet Take 1 tablet (40 mg total) by mouth daily. Patient not taking: Reported on 09/01/2021 07/28/20   Horald Pollen, MD    Allergies    Patient has no known allergies.  Review of Systems   Review of Systems  Constitutional:  Negative for chills and fever.  HENT:  Negative for congestion and rhinorrhea.   Eyes:  Negative for redness and visual disturbance.  Respiratory:  Negative for shortness of breath and wheezing.   Cardiovascular:  Negative for chest pain and palpitations.  Gastrointestinal:  Negative for nausea and vomiting.  Genitourinary:  Negative for dysuria and urgency.  Musculoskeletal:  Negative for arthralgias and myalgias.  Skin:  Negative for pallor and wound.  Neurological:  Negative for dizziness and headaches.   Physical Exam Updated Vital Signs BP (!) 160/93 (BP Location: Right Arm)   Pulse 75   Temp 98.1 F (36.7 C) (Oral)   Resp 16   Ht 5\' 3"  (1.6 m)   Wt 83.9 kg   LMP 08/10/2015 (Approximate) Comment: continuous bleeding/spotting for months  SpO2 98%   BMI 32.77 kg/m   Physical Exam Vitals and nursing note reviewed.  Constitutional:      General: She is not in acute distress.    Appearance: She is well-developed. She is not diaphoretic.  HENT:     Head: Normocephalic and atraumatic.     Ears:     Comments: Bilateral wax impaction without obvious foreign body. Eyes:     Pupils: Pupils are equal, round, and reactive to light.  Cardiovascular:     Rate and Rhythm: Normal rate and regular rhythm.     Heart sounds: No murmur heard.   No friction rub. No gallop.  Pulmonary:     Effort: Pulmonary effort is normal.     Breath sounds: No wheezing or rales.  Abdominal:     General: There is no distension.     Palpations: Abdomen is soft.     Tenderness: There is no abdominal  tenderness.  Musculoskeletal:        General: No tenderness.     Cervical back: Normal range of motion and neck supple.  Skin:    General: Skin is warm and dry.  Neurological:     Mental Status: She is alert and oriented to person, place, and time.  Psychiatric:  Behavior: Behavior normal.    ED Results / Procedures / Treatments   Labs (all labs ordered are listed, but only abnormal results are displayed) Labs Reviewed - No data to display  EKG None  Radiology No results found.  Procedures Procedures   Medications Ordered in ED Medications  hydrogen peroxide 3 % external solution (  Given 09/02/21 0034)    ED Course  I have reviewed the triage vital signs and the nursing notes.  Pertinent labs & imaging results that were available during my care of the patient were reviewed by me and considered in my medical decision making (see chart for details).    MDM Rules/Calculators/A&P                           55 yo F with a chief complaints of a suspicion of a foreign body to the right ear.  Patient thinks that she feels something moving in there.  No obvious foreign body on my exam.  We will have the nurse attempt irrigation and reassess.  Nursing able to get out some earwax.  No other noted foreign body.  Discharge home.  1:03 AM:  I have discussed the diagnosis/risks/treatment options with the patient and family and believe the pt to be eligible for discharge home to follow-up with PCP. We also discussed returning to the ED immediately if new or worsening sx occur. We discussed the sx which are most concerning (e.g., sudden worsening pain, fever, inability to tolerate by mouth) that necessitate immediate return. Medications administered to the patient during their visit and any new prescriptions provided to the patient are listed below.  Medications given during this visit Medications  hydrogen peroxide 3 % external solution (  Given 09/02/21 0034)     The  patient appears reasonably screen and/or stabilized for discharge and I doubt any other medical condition or other [facility] requiring further screening, evaluation, or treatment in the ED at this time prior to discharge.   Final Clinical Impression(s) / ED Diagnoses Final diagnoses:  Wax in ear    Rx / DC Orders ED Discharge Orders     None        Deno Etienne, DO 09/02/21 0103

## 2021-11-18 ENCOUNTER — Other Ambulatory Visit: Payer: Self-pay | Admitting: Emergency Medicine

## 2021-12-08 ENCOUNTER — Ambulatory Visit: Payer: Self-pay | Admitting: Emergency Medicine

## 2021-12-29 ENCOUNTER — Ambulatory Visit: Payer: Self-pay | Admitting: Emergency Medicine

## 2022-05-17 ENCOUNTER — Other Ambulatory Visit: Payer: Self-pay

## 2022-05-17 DIAGNOSIS — R0683 Snoring: Secondary | ICD-10-CM

## 2022-07-09 ENCOUNTER — Ambulatory Visit (INDEPENDENT_AMBULATORY_CARE_PROVIDER_SITE_OTHER)
Admission: RE | Admit: 2022-07-09 | Discharge: 2022-07-09 | Disposition: A | Discharge status: Home / Self Care | Payer: Commercial Managed Care - HMO | Source: Ambulatory Visit | Attending: Pulmonary Disease | Admitting: Pulmonary Disease

## 2022-07-09 ENCOUNTER — Ambulatory Visit (INDEPENDENT_AMBULATORY_CARE_PROVIDER_SITE_OTHER): Payer: Commercial Managed Care - HMO | Admitting: Pulmonary Disease

## 2022-07-09 ENCOUNTER — Encounter: Payer: Self-pay | Admitting: Pulmonary Disease

## 2022-07-09 VITALS — BP 148/76 | HR 76 | Temp 98.0°F | Wt 185.0 lb

## 2022-07-09 DIAGNOSIS — J42 Unspecified chronic bronchitis: Secondary | ICD-10-CM | POA: Diagnosis not present

## 2022-07-09 DIAGNOSIS — J45909 Unspecified asthma, uncomplicated: Secondary | ICD-10-CM

## 2022-07-09 DIAGNOSIS — R0600 Dyspnea, unspecified: Secondary | ICD-10-CM

## 2022-07-09 LAB — CBC WITH DIFFERENTIAL/PLATELET
Basophils Absolute: 0 10*3/uL (ref 0.0–0.1)
Basophils Relative: 0.5 % (ref 0.0–3.0)
Eosinophils Absolute: 0 10*3/uL (ref 0.0–0.7)
Eosinophils Relative: 0.5 % (ref 0.0–5.0)
HCT: 40.1 % (ref 36.0–46.0)
Hemoglobin: 13.3 g/dL (ref 12.0–15.0)
Lymphocytes Relative: 19.4 % (ref 12.0–46.0)
Lymphs Abs: 1.7 10*3/uL (ref 0.7–4.0)
MCHC: 33.2 g/dL (ref 30.0–36.0)
MCV: 84.5 fl (ref 78.0–100.0)
Monocytes Absolute: 0.7 10*3/uL (ref 0.1–1.0)
Monocytes Relative: 8.2 % (ref 3.0–12.0)
Neutro Abs: 6.4 10*3/uL (ref 1.4–7.7)
Neutrophils Relative %: 71.4 % (ref 43.0–77.0)
Platelets: 178 10*3/uL (ref 150.0–400.0)
RBC: 4.75 Mil/uL (ref 3.87–5.11)
RDW: 15.1 % (ref 11.5–15.5)
WBC: 9 10*3/uL (ref 4.0–10.5)

## 2022-07-09 NOTE — Patient Instructions (Signed)
Recurrent cough, daily mucus production: I think this is asthma, however we need to do some testing to understand better Lung function testing Exhaled nitric oxide testing Chest x-ray Provide [place] with a sample of your mucus so that we can test it for bacterial, fungal, AFB culture We will check for allergy with: CBC with differential, serum IgE Take Arnuity 1 puff daily no matter how you feel, rinse her mouth afterwards Keep using albuterol as needed for chest tightness wheezing or shortness of breath   We will see you back in 2 to 3 weeks with a nurse practitioner to see how you are doing with the [place].

## 2022-07-09 NOTE — Progress Notes (Signed)
Synopsis: Referred in September 2023 for asthma.  Had [place] in 2020.  Subjective:   PATIENT ID: Stephanie Gibson GENDER: female DOB: Dec 10, 1965, MRN: [id number]   HPI  Chief Complaint  Patient presents with   Consult    SOB at times/ productive cough    She is here to see me today because she recently had a respiratory infection and she has been having dyspnea, back pain, chest pain and fatigue.  She says that she has a lot of chest pressure.  Started in December when she went to [place], but has happened a few times since then.  She has been told that she may have asthma when she was in [place], but she still has the problem of back pain.  She was prescribed an antibiotic (amoxicillin) and prescribed inhaler.  She was given ventolin initially, then later another albuterol inhaler which she uses three times a day.  She has been taking montelukast.  No problems with lungs as a child, never smoked.  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  She doesn't have significant lung problems, grandfather had asthma.  She says that after having COVID in 2020 she started having these problems.   She coughs up phlegm every day. She had cough with some blood in it, this happened this weekend.   Since coming from [place] she thinks that she has been treated with antibiotics 3-4 times.   No fever other than when she feels sick, one time she had a fever for 3 days in August, she'll have intermittent chills.  Sometimes she'll feel dizzy and she can't walk much because of this and due to fatigue and pain in her chest. She has a lot of night sweat at night.  She has lost about 6 pounds.  She doesn't feel much sinus congestion.  No heartburn or indigestion.    Other medical problems: hypertension  Record review: 2021 rheumatology visit reviewed where she was seen for hand pain, noted to have osteoarthritis, labs sent for rheumatoid arthritis.  Past Medical History:  Diagnosis Date   Anemia    Ectopic pregnancy     Essential hypertension    Leiomyoma of uterus    Pneumonia    Shortness of breath dyspnea      Family History  Problem Relation Age of Onset   Hypertension Mother    Arthritis Mother    Cancer Father        THROAT    Hypertension Sister    Hypertension Brother    Colon cancer Neg Hx      Social History   Socioeconomic History   Marital status: Married    Spouse name: Not on file   Number of children: Not on file   Years of education: Not on file   Highest education level: Not on file  Occupational History   Not on file  Tobacco Use   Smoking status: Never   Smokeless tobacco: Never  Vaping Use   Vaping Use: Never used  Substance and Sexual Activity   Alcohol use: Yes    Alcohol/week: 0.0 standard drinks of alcohol    Comment: occ   Drug use: No   Sexual activity: Yes    Birth control/protection: None    Comment: INTERCOURSE AGE 21, SEXUAL PARTNERS LESS THAN 5, current partner-  22 yrs   Other Topics Concern   Not on file  Social History Narrative   Not on file   Social Determinants of Health  Financial Resource Strain: Not on file  Food Insecurity: Not on file  Transportation Needs: Not on file  Physical Activity: Not on file  Stress: Not on file  Social Connections: Not on file  Intimate Partner Violence: Not on file     No Known Allergies   Outpatient Medications Prior to Visit  Medication Sig Dispense Refill   albuterol (VENTOLIN HFA) 108 (90 Base) MCG/ACT inhaler Inhale 1-2 puffs into the lungs every 6 (six) hours as needed.     amLODipine (NORVASC) 5 MG tablet Take 1 tablet (5 mg total) by mouth daily. 90 tablet 3   diclofenac (VOLTAREN) 75 MG EC tablet Take 1 tablet (75 mg total) by mouth 2 (two) times daily. 30 tablet 0   estradiol (ESTRACE) 1 MG tablet Take 1 tablet (1 mg total) by mouth daily. 90 tablet 3   hydrochlorothiazide (HYDRODIURIL) 25 MG tablet Take 1 tablet (25 mg total) by mouth daily. 90 tablet 3   hydrOXYzine (VISTARIL) 100 MG  capsule Take 1 capsule (100 mg total) by mouth at bedtime. 30 capsule 1   pantoprazole (PROTONIX) 40 MG tablet Take 1 tablet (40 mg total) by mouth daily. 30 tablet 1   Facility-Administered Medications Prior to Visit  Medication Dose Route Frequency Provider Last Rate Last Admin   0.9 %  sodium chloride infusion  500 mL Intravenous Once Nandigam, Kavitha V, MD        ROS Gen: Denies fever, chills, weight change, fatigue, night sweats HEENT: Denies blurred vision, double vision, hearing loss, tinnitus, sinus congestion, rhinorrhea, sore throat, neck stiffness, dysphagia PULM: per HPI CV: Denies chest pain, edema, orthopnea, paroxysmal nocturnal dyspnea, palpitations GI: Denies abdominal pain, nausea, vomiting, diarrhea, hematochezia, melena, constipation, change in bowel habits GU: Denies dysuria, hematuria, polyuria, oliguria, urethral discharge Endocrine: Denies hot or cold intolerance, polyuria, polyphagia or appetite change Derm: Denies rash, dry skin, scaling or peeling skin change Heme: Denies easy bruising, bleeding, bleeding gums Neuro: Denies headache, numbness, weakness, slurred speech, loss of memory or consciousness    Objective:  Physical Exam   Vitals:   07/09/22 1429  BP: (!) 148/76  Pulse: 76  Temp: 98 F (36.7 C)  TempSrc: Oral  SpO2: 98%  Weight: 185 lb (83.9 kg)    RA  Gen: well appearing, no acute distress HENT: NCAT, OP clear, neck supple without masses Eyes: PERRL, EOMi Lymph: no cervical lymphadenopathy PULM: CTA B CV: RRR, no mgr, no JVD GI: BS+, soft, nontender, no hsm Derm: no rash or skin breakdown MSK: normal bulk and tone Neuro: A&Ox4, CN II-XII intact, strength 5/5 in all 4 extremities Psyche: normal mood and affect   CBC    Component Value Date/Time   WBC 5.8 06/25/2016 0826   RBC 4.94 06/25/2016 0826   HGB 13.7 06/25/2016 0826   HCT 40.2 06/25/2016 0826   PLT 179 06/25/2016 0826   MCV 81.4 06/25/2016 0826   MCH 27.7  06/25/2016 0826   MCHC 34.1 06/25/2016 0826   RDW 16.0 (H) 06/25/2016 0826   LYMPHSABS 1,856 06/25/2016 0826   MONOABS 464 06/25/2016 0826   EOSABS 58 06/25/2016 0826   BASOSABS 58 06/25/2016 0826     Chest imaging: December 2020 chest x-ray independently reviewed showing normal pulmonary parenchyma, no pulmonary infiltrate  PFT:  Labs:  Path:  Echo:  Heart Catheterization:       Assessment & Plan:   Asthma, unspecified asthma severity, unspecified whether complicated, unspecified whether persistent - Plan: CBC w/Diff, Pulmonary  function test, Respiratory or Resp and Sputum Culture, Respiratory or Resp and Sputum Culture, AFB Culture & Smear, DG Chest 2 View, IgE, POCT EXHALED NITRIC OXIDE  Chronic bronchitis, unspecified chronic bronchitis type (HCC)  Dyspnea, unspecified type  Discussion: Jaquaya is here to see me today for a syndrome of recurrent bronchitis, mucus production, and shortness of breath.  I think this likely is asthma, however I cannot rule out something like bronchiectasis or chronic lung infection.  She has not had much testing for this at all.  Gastroesophageal reflux disease may contribute to some degree though she says she has minimal symptoms on the PPI.  She has no sinus symptoms.  Plan: Recurrent cough, daily mucus production: I think this is asthma, however we need to do some testing to understand better Lung function testing Exhaled nitric oxide testing Chest x-ray Provide [place] with a sample of your mucus so that we can test it for bacterial, fungal, AFB culture We will check for allergy with: CBC with differential, serum IgE Take Arnuity 1 puff daily no matter how you feel, rinse her mouth afterwards Keep using albuterol as needed for chest tightness wheezing or shortness of breath   We will see you back in 2 to 3 weeks with a nurse practitioner to see how you are doing with the [place].  Immunizations: Immunization History  Administered  Date(s) Administered   Influenza Split 10/09/2015, 10/15/2017   Influenza,inj,Quad PF,6+ Mos 10/09/2015, 10/15/2017, 01/16/2022   PFIZER(Purple Top)SARS-COV-2 Vaccination 01/19/2020, 02/11/2020     Current Outpatient Medications:    albuterol (VENTOLIN HFA) 108 (90 Base) MCG/ACT inhaler, Inhale 1-2 puffs into the lungs every 6 (six) hours as needed., Disp: , Rfl:    amLODipine (NORVASC) 5 MG tablet, Take 1 tablet (5 mg total) by mouth daily., Disp: 90 tablet, Rfl: 3   diclofenac (VOLTAREN) 75 MG EC tablet, Take 1 tablet (75 mg total) by mouth 2 (two) times daily., Disp: 30 tablet, Rfl: 0   estradiol (ESTRACE) 1 MG tablet, Take 1 tablet (1 mg total) by mouth daily., Disp: 90 tablet, Rfl: 3   hydrochlorothiazide (HYDRODIURIL) 25 MG tablet, Take 1 tablet (25 mg total) by mouth daily., Disp: 90 tablet, Rfl: 3   hydrOXYzine (VISTARIL) 100 MG capsule, Take 1 capsule (100 mg total) by mouth at bedtime., Disp: 30 capsule, Rfl: 1   pantoprazole (PROTONIX) 40 MG tablet, Take 1 tablet (40 mg total) by mouth daily., Disp: 30 tablet, Rfl: 1  Current Facility-Administered Medications:    0.9 %  sodium chloride infusion, 500 mL, Intravenous, Once, Nandigam, Venia Minks, MD

## 2022-07-13 ENCOUNTER — Other Ambulatory Visit: Payer: Commercial Managed Care - HMO

## 2022-07-13 ENCOUNTER — Other Ambulatory Visit: Payer: Self-pay

## 2022-07-13 DIAGNOSIS — J45909 Unspecified asthma, uncomplicated: Secondary | ICD-10-CM

## 2022-07-13 LAB — IGE: IgE (Immunoglobulin E), Serum: 35 kU/L (ref <=114)

## 2022-07-13 MED ORDER — ARNUITY ELLIPTA 100 MCG/ACT IN AEPB: 1.0000 | INHALATION_SPRAY | Freq: Every day | RESPIRATORY_TRACT | 0 refills | Status: DC

## 2022-07-14 LAB — RESPIRATORY CULTURE OR RESPIRATORY AND SPUTUM CULTURE: MICRO NUMBER:: 13872176

## 2022-07-16 LAB — RESPIRATORY CULTURE OR RESPIRATORY AND SPUTUM CULTURE
MICRO NUMBER:: 13872177
SPECIMEN QUALITY:: ADEQUATE

## 2022-07-30 ENCOUNTER — Encounter: Payer: Self-pay | Admitting: Nurse Practitioner

## 2022-07-30 ENCOUNTER — Ambulatory Visit: Payer: Commercial Managed Care - HMO | Admitting: Nurse Practitioner

## 2022-07-30 VITALS — BP 150/90 | HR 94 | Ht 62.0 in | Wt 187.4 lb

## 2022-07-30 DIAGNOSIS — R042 Hemoptysis: Secondary | ICD-10-CM | POA: Insufficient documentation

## 2022-07-30 DIAGNOSIS — J14 Pneumonia due to Hemophilus influenzae: Secondary | ICD-10-CM | POA: Diagnosis not present

## 2022-07-30 DIAGNOSIS — A492 Hemophilus influenzae infection, unspecified site: Secondary | ICD-10-CM | POA: Diagnosis not present

## 2022-07-30 DIAGNOSIS — J4520 Mild intermittent asthma, uncomplicated: Secondary | ICD-10-CM | POA: Diagnosis not present

## 2022-07-30 MED ORDER — LEVOFLOXACIN 500 MG PO TABS: 500.0000 mg | ORAL_TABLET | Freq: Every day | ORAL | 0 refills | Status: DC

## 2022-07-30 NOTE — Assessment & Plan Note (Signed)
Intermittent for 9 months per her report. Low suspicion for PE given this timeline. She was instructed to go to the ED should she develop worsening/mass hemoptysis. See above plan.

## 2022-07-30 NOTE — Assessment & Plan Note (Signed)
Recurrent productive cough with small amount of hemoptysis.  She has been having trouble with this since December 2022.  She has been treated with numerous antibiotics.  Most recently Augmentin in late August.  No significant change in symptoms.  This could be due to resistance to beta lactams/ampicillin. Given her infectious symptoms, we will treat her with levaquin course x 7 days. She will also need CT chest for further evaluation. Depending on findings/response to abx, she may need to undergo bronchoscopy for further evaluation. Discussed case with Dr. Lake Bells. Close follow up and strict ED precautions.  Patient Instructions  Continue Albuterol inhaler 2 puffs every 6 hours as needed for shortness of breath or wheezing. Notify if symptoms persist despite rescue inhaler/neb use.  You can stop Arnuity if no improvement in your cough/breathing  Levaquin 1 tab daily for 7 days. Take with food. Notify and stop immediately if tendon pain develops, or if you develop 3-4 watery bowel movements in a 24 hour period. Eat yogurt or take a daily probiotic while taking Mucinex 600 mg Twice daily for chest congestion/cough  CT chest - someone will contact you for scheduling  Follow up in one week with Dr. Lake Bells or Alanson Aly. If symptoms do not improve or worsen, please contact office for sooner follow up or seek emergency care.

## 2022-07-30 NOTE — Patient Instructions (Signed)
Continue Albuterol inhaler 2 puffs every 6 hours as needed for shortness of breath or wheezing. Notify if symptoms persist despite rescue inhaler/neb use.  You can stop Arnuity if no improvement in your cough/breathing  Levaquin 1 tab daily for 7 days. Take with food. Notify and stop immediately if tendon pain develops, or if you develop 3-4 watery bowel movements in a 24 hour period. Eat yogurt or take a daily probiotic while taking Mucinex 600 mg Twice daily for chest congestion/cough  CT chest - someone will contact you for scheduling  Follow up in one week with Dr. Lake Bells or Alanson Aly. If symptoms do not improve or worsen, please contact office for sooner follow up or seek emergency care.

## 2022-07-30 NOTE — Progress Notes (Signed)
$'@Patient'y$  ID: Stephanie Gibson, female    DOB: September 10, 1966, 56 y.o.   MRN: [id number]  Chief Complaint  Patient presents with   Follow-up    Referring provider: Horald Pollen, *  HPI: 56 year old female, never smoker followed for dyspnea, chronic bronchitis and hemoptysis.  She is a patient of Dr. Anastasia Pall and last seen in office 07/09/2022 for initial pulmonary consult.  Past medical history significant for hypertension, osteoarthritis, seasonal allergies.  She was initially referred due to persistent dyspnea, back pain, chest pain and fatigue.  Was feeling like she was having a lot of chest pressure.  Symptoms initially started in December when she went to [place], but reoccurred numerous times afterwards.  She was treated with amoxicillin and prescribed an inhaler.  She was told that she may have asthma when she was in [place].  She was also provided with an albuterol inhaler and started on Singulair.  Treated with antibiotics 3-4 times since.  She did not have any history of lung problems.  She reported a persistent cough with phlegm every day.  Occasionally with some blood in it.  TEST/EVENTS:  07/09/2022: Eos 0, IgE normal 07/09/2022: Sputum culture with heavy growth of haemophilus parainfluenzae 07/09/2022: Lungs are clear  07/09/2022: OV with Dr. Lake Bells for initial pulmonary consult.  Discussed that there was a possibility this could be asthma.  Unable to rule out something like bronchiectasis or chronic lung infection.  Recommended further work-up with sputum culture and AFB.  CXR was clear.  CBC with differential and IgE were ordered.  She was started on Arnuity for possible asthmatic component to symptoms.  07/30/2022: Today-follow-up Patient presents today for follow-up after being started on Arnuity.  She also underwent work-up for recurrent productive cough, occasionally with hemoptysis.  CBC with differential showed no elevation in white blood cell count and eosinophils were 0.  IgE  was normal.  Her sputum culture did grow out haemophilus parainfluenzae.  Today, she tells me that she has not noticed any difference being on the Arnuity.  She still has discomfort in her chest and back, especially with breathing or coughing.  She continues to produce yellow-colored sputum.  She also has coughed up small amounts of blood-tinged sputum, most recent occurrence was 3 days ago.  Breathing is overall stable.  She does not feel any significant increase in her shortness of breath.  Denies any wheezing, fevers, night sweats, chills, leg swelling, calf pain.  She tells me that prior to her initial visit with Dr. Lake Bells she had completed Augmentin course; did not notice a huge difference in her symptoms.  No Known Allergies  Immunization History  Administered Date(s) Administered   Influenza Split 10/09/2015, 10/15/2017   Influenza,inj,Quad PF,6+ Mos 10/09/2015, 10/15/2017, 01/16/2022   PFIZER(Purple Top)SARS-COV-2 Vaccination 01/19/2020, 02/11/2020    Past Medical History:  Diagnosis Date   Anemia    Ectopic pregnancy    Essential hypertension    Leiomyoma of uterus    Pneumonia    Shortness of breath dyspnea     Tobacco History: Social History   Tobacco Use  Smoking Status Never  Smokeless Tobacco Never   Counseling given: Not Answered   Outpatient Medications Prior to Visit  Medication Sig Dispense Refill   albuterol (VENTOLIN HFA) 108 (90 Base) MCG/ACT inhaler Inhale 1-2 puffs into the lungs every 6 (six) hours as needed.     Fluticasone Furoate (ARNUITY ELLIPTA) 100 MCG/ACT AEPB Inhale 1 puff into the lungs daily. 14 each 0  amLODipine (NORVASC) 5 MG tablet Take 1 tablet (5 mg total) by mouth daily. 90 tablet 3   diclofenac (VOLTAREN) 75 MG EC tablet Take 1 tablet (75 mg total) by mouth 2 (two) times daily. 30 tablet 0   estradiol (ESTRACE) 1 MG tablet Take 1 tablet (1 mg total) by mouth daily. 90 tablet 3   hydrochlorothiazide (HYDRODIURIL) 25 MG tablet Take 1  tablet (25 mg total) by mouth daily. 90 tablet 3   hydrOXYzine (VISTARIL) 100 MG capsule Take 1 capsule (100 mg total) by mouth at bedtime. 30 capsule 1   pantoprazole (PROTONIX) 40 MG tablet Take 1 tablet (40 mg total) by mouth daily. 30 tablet 1   Facility-Administered Medications Prior to Visit  Medication Dose Route Frequency Provider Last Rate Last Admin   0.9 %  sodium chloride infusion  500 mL Intravenous Once Nandigam, Kavitha V, MD         Review of Systems:   Constitutional: No weight loss or gain, night sweats, fevers, chills, or lassitude. +fatigue  HEENT: No headaches, difficulty swallowing, tooth/dental problems, or sore throat. No sneezing, itching, ear ache, nasal congestion, or post nasal drip CV:  No chest pain, orthopnea, PND, swelling in lower extremities, anasarca, dizziness, palpitations, syncope Resp: +chest discomfort, shortness of breath with exertion (baseline); productive cough; small amount of hemoptysis. No wheezing.  No chest wall deformity GI:  No heartburn, indigestion, abdominal pain, nausea, vomiting, diarrhea, change in bowel habits, loss of appetite, bloody stools.  GU: No dysuria, change in color of urine, urgency or frequency.  No flank pain, no hematuria  Skin: No rash, lesions, ulcerations MSK:  +occasional joint pain. No joint swelling.  No decreased range of motion.  No back pain. Neuro: No dizziness or lightheadedness.  Psych: No depression or anxiety. Mood stable.     Physical Exam:  BP (!) 150/90 (BP Location: Left Arm) Comment: White coat syndrome  Pulse 94   Ht '5\' 2"'$  (1.575 m)   Wt 187 lb 6.4 oz (85 kg)   LMP 08/10/2015 (Approximate) Comment: continuous bleeding/spotting for months  SpO2 97%   BMI 34.28 kg/m   GEN: Pleasant, interactive, well-appearing; obese; in no acute distress. HEENT:  Normocephalic and atraumatic. PERRLA. Sclera white. Nasal turbinates pink, moist and patent bilaterally. No rhinorrhea present. Oropharynx pink  and moist, without exudate or edema. No lesions, ulcerations, or postnasal drip.  NECK:  Supple w/ fair ROM. No JVD present. Normal carotid impulses w/o bruits. Thyroid symmetrical with no goiter or nodules palpated. No lymphadenopathy.   CV: RRR, no m/r/g, no peripheral edema. Pulses intact, +2 bilaterally. No cyanosis, pallor or clubbing. PULMONARY:  Unlabored, regular breathing. Clear bilaterally A&P w/o wheezes/rales/rhonchi. No accessory muscle use. No dullness to percussion. GI: BS present and normoactive. Soft, non-tender to palpation. No organomegaly or masses detected.  MSK: No erythema, warmth or tenderness. No deformities or joint swelling noted.  Neuro: A/Ox3. No focal deficits noted.   Skin: Warm, no lesions or rashe Psych: Normal affect and behavior. Judgement and thought content appropriate.     Lab Results:  CBC    Component Value Date/Time   WBC 9.0 07/09/2022 1519   RBC 4.75 07/09/2022 1519   HGB 13.3 07/09/2022 1519   HCT 40.1 07/09/2022 1519   PLT 178.0 07/09/2022 1519   MCV 84.5 07/09/2022 1519   MCH 27.7 06/25/2016 0826   MCHC 33.2 07/09/2022 1519   RDW 15.1 07/09/2022 1519   LYMPHSABS 1.7 07/09/2022 1519   MONOABS  0.7 07/09/2022 1519   EOSABS 0.0 07/09/2022 1519   BASOSABS 0.0 07/09/2022 1519    BMET    Component Value Date/Time   NA 140 03/25/2020 1027   K 3.8 03/25/2020 1027   CL 101 03/25/2020 1027   CO2 24 03/25/2020 1027   GLUCOSE 104 (H) 03/25/2020 1027   GLUCOSE 100 (H) 06/25/2016 0826   BUN 14 03/25/2020 1027   CREATININE 0.67 03/25/2020 1027   CREATININE 0.64 06/25/2016 0826   CALCIUM 9.5 03/25/2020 1027   GFRNONAA 99 03/25/2020 1027   GFRAA 114 03/25/2020 1027    BNP No results found for: "BNP"   Imaging:  DG Chest 2 View  Result Date: 07/11/2022 CLINICAL DATA:  asthma EXAM: CHEST - 2 VIEW COMPARISON:  Chest x-ray 10/30/2019, CT chest 08/11/2015 FINDINGS: The heart and mediastinal contours are within normal limits. No focal  consolidation. No pulmonary edema. No pleural effusion. No pneumothorax. No acute osseous abnormality. IMPRESSION: No active cardiopulmonary disease. Electronically Signed   By: Iven Finn M.D.   On: 07/11/2022 21:16          No data to display          No results found for: "NITRICOXIDE"      Assessment & Plan:   Haemophilus influenzae infection Recurrent productive cough with small amount of hemoptysis.  She has been having trouble with this since December 2022.  She has been treated with numerous antibiotics.  Most recently Augmentin in late August.  No significant change in symptoms.  This could be due to resistance to beta lactams/ampicillin. Given her infectious symptoms, we will treat her with levaquin course x 7 days. She will also need CT chest for further evaluation. Depending on findings/response to abx, she may need to undergo bronchoscopy for further evaluation. Discussed case with Dr. Lake Bells. Close follow up and strict ED precautions.  Patient Instructions  Continue Albuterol inhaler 2 puffs every 6 hours as needed for shortness of breath or wheezing. Notify if symptoms persist despite rescue inhaler/neb use.  You can stop Arnuity if no improvement in your cough/breathing  Levaquin 1 tab daily for 7 days. Take with food. Notify and stop immediately if tendon pain develops, or if you develop 3-4 watery bowel movements in a 24 hour period. Eat yogurt or take a daily probiotic while taking Mucinex 600 mg Twice daily for chest congestion/cough  CT chest - someone will contact you for scheduling  Follow up in one week with Dr. Lake Bells or Alanson Aly. If symptoms do not improve or worsen, please contact office for sooner follow up or seek emergency care.     Mild intermittent asthma without complication No significant response to ICS and there is no elevation in blood eosinophils.  Unable to complete FeNO today.  While she still could mild underlying asthma, not  convinced that her current symptoms are related to this, given her recent sputum culture.  See above plan.  Hemoptysis Intermittent for 9 months per her report. Low suspicion for PE given this timeline. She was instructed to go to the ED should she develop worsening/mass hemoptysis. See above plan.   I spent 38 minutes of dedicated to the care of this patient on the date of this encounter to include pre-visit review of records, face-to-face time with the patient discussing conditions above, post visit ordering of testing, clinical documentation with the electronic health record, making appropriate referrals as documented, and communicating necessary findings to members of the patients care team.  Clayton Bibles, NP 07/30/2022  Pt aware and understands NP's role.

## 2022-07-30 NOTE — Assessment & Plan Note (Addendum)
No significant response to ICS and there is no elevation in blood eosinophils.  Unable to complete FeNO today.  While she still could mild underlying asthma, not convinced that her current symptoms are related to this, given her recent sputum culture.  See above plan.

## 2022-08-05 ENCOUNTER — Ambulatory Visit
Admission: RE | Admit: 2022-08-05 | Discharge: 2022-08-05 | Disposition: A | Discharge status: Home / Self Care | Payer: Commercial Managed Care - HMO | Source: Ambulatory Visit | Attending: Nurse Practitioner | Admitting: Nurse Practitioner

## 2022-08-05 DIAGNOSIS — R042 Hemoptysis: Secondary | ICD-10-CM

## 2022-08-05 MED ORDER — IOPAMIDOL (ISOVUE-300) INJECTION 61%
75.0000 mL | Freq: Once | INTRAVENOUS | Status: AC | PRN
  Administered 2022-08-05: 75 mL via INTRAVENOUS

## 2022-08-06 ENCOUNTER — Encounter: Payer: Self-pay | Admitting: Nurse Practitioner

## 2022-08-06 ENCOUNTER — Ambulatory Visit: Payer: Commercial Managed Care - HMO | Admitting: Nurse Practitioner

## 2022-08-06 VITALS — BP 122/84 | HR 81 | Ht 62.0 in | Wt 181.4 lb

## 2022-08-06 DIAGNOSIS — J201 Acute bronchitis due to Hemophilus influenzae: Secondary | ICD-10-CM

## 2022-08-06 DIAGNOSIS — J209 Acute bronchitis, unspecified: Secondary | ICD-10-CM | POA: Insufficient documentation

## 2022-08-06 DIAGNOSIS — A492 Hemophilus influenzae infection, unspecified site: Secondary | ICD-10-CM | POA: Diagnosis not present

## 2022-08-06 DIAGNOSIS — J4520 Mild intermittent asthma, uncomplicated: Secondary | ICD-10-CM

## 2022-08-06 MED ORDER — PREDNISONE 20 MG PO TABS: 40.0000 mg | ORAL_TABLET | Freq: Every day | ORAL | 0 refills | Status: AC

## 2022-08-06 MED ORDER — BENZONATATE 200 MG PO CAPS: 200.0000 mg | ORAL_CAPSULE | Freq: Three times a day (TID) | ORAL | 1 refills | Status: AC | PRN

## 2022-08-06 MED ORDER — LEVOFLOXACIN 500 MG PO TABS: 500.0000 mg | ORAL_TABLET | Freq: Every day | ORAL | 0 refills | Status: AC

## 2022-08-06 NOTE — Assessment & Plan Note (Signed)
Recurrent productive cough with small amount of hemoptysis since December 2022.  She was treated with numerous antibiotics without significant change in symptoms; suspected to be related to resistance to beta lactams/ampicillin.  We treated her with Levaquin 7-day course at her last visit 9/22.  She is somewhat improved today. Her sputum seems to have cleared up, less purulent and without any further hemoptysis.  We will extend out her Levaquin for another 3 days.  Do suspect that a portion of her cough and discomfort is from inflammation/irritation.  Advised that we target cough control measures to try to calm down the cough and reduce inflammation is much as possible.  We will also try a short course of prednisone to see how she responds to this.  Awaiting CT scan results.  We will determine need for bronchoscopy based on these and response to above measures.  Patient Instructions  Continue Albuterol inhaler 2 puffs every 6 hours as needed for shortness of breath or wheezing. Notify if symptoms persist despite rescue inhaler/neb use.  Continue protonix 40 mg daily  Continue Arnuity 1 puff daily as needed for shortness of breath or wheezing. We will see how you response to prednisone and may have you start this daily   -Levaquin 1 tab daily for an additional 3 days. Take with food. Notify and stop immediately if tendon pain develops, or if you develop 3-4 watery bowel movements in a 24 hour period. Eat yogurt or take a daily probiotic while taking -Increase Mucinex 600 mg to Twice daily for chest congestion/cough  Target your cough to minimize further irritation with: -Delsym 2 tsp Twice daily for cough  -Tessalon perles (benzonatate) 1 capsule Three times a day as needed for cough  -Prednisone 40 mg daily for 5 days. Take in AM with food.    Follow up in one week with Dr. Lake Bells (1st) or Katie Kieryn Burtis,NP. If symptoms do not improve or worsen, please contact office for sooner follow up or seek  emergency care.

## 2022-08-06 NOTE — Assessment & Plan Note (Addendum)
Again tried to obtain FeNO today. No significant response to ICS but does respond well to albuterol. She doesn't tend to like the DPI so this could be part of the reason why she limits use/receives minimal response. Previous eosinophils 0. We will see how she responds to prednisone and may consider trial changing to [facility] style ICS.

## 2022-08-06 NOTE — Patient Instructions (Addendum)
Continue Albuterol inhaler 2 puffs every 6 hours as needed for shortness of breath or wheezing. Notify if symptoms persist despite rescue inhaler/neb use.  Continue protonix 40 mg daily  Continue Arnuity 1 puff daily as needed for shortness of breath or wheezing. We will see how you response to prednisone and may have you start this daily   -Levaquin 1 tab daily for an additional 3 days. Take with food. Notify and stop immediately if tendon pain develops, or if you develop 3-4 watery bowel movements in a 24 hour period. Eat yogurt or take a daily probiotic while taking -Increase Mucinex 600 mg to Twice daily for chest congestion/cough  Target your cough to minimize further irritation with: -Delsym 2 tsp Twice daily for cough  -Tessalon perles (benzonatate) 1 capsule Three times a day as needed for cough  -Prednisone 40 mg daily for 5 days. Take in AM with food.    Follow up in one week with Dr. Lake Bells (1st) or Katie Hani Patnode,NP. If symptoms do not improve or worsen, please contact office for sooner follow up or seek emergency care.

## 2022-08-06 NOTE — Assessment & Plan Note (Addendum)
Likely component of inflammation due to persistent coughing/recent infection contributing to her cough/chest discomfort. See above plan.

## 2022-08-06 NOTE — Progress Notes (Signed)
$'@Patient'O$  ID: Donne Hazel, female    DOB: Nov 06, 1966, 56 y.o.   MRN: [id number]  Chief Complaint  Patient presents with   Follow-up    Referring provider: Horald Pollen, *  HPI: 56 year old female, never smoker followed for dyspnea, chronic bronchitis and hemoptysis.  She is a patient of Dr. Anastasia Pall and last seen in office 07/09/2022 for initial pulmonary consult.  Past medical history significant for hypertension, osteoarthritis, seasonal allergies.  She was initially referred due to persistent dyspnea, back pain, chest pain and fatigue.  Was feeling like she was having a lot of chest pressure.  Symptoms initially started in December when she went to [place], but reoccurred numerous times afterwards.  She was treated with amoxicillin and prescribed an inhaler.  She was told that she may have asthma when she was in [place].  She was also provided with an albuterol inhaler and started on Singulair.  Treated with antibiotics 3-4 times since.  She did not have any history of lung problems.  She reported a persistent cough with phlegm every day.  Occasionally with some blood in it.  TEST/EVENTS:  07/09/2022: Eos 0, IgE normal 07/09/2022: Sputum culture with heavy growth of haemophilus parainfluenzae 07/09/2022: Lungs are clear  07/09/2022: OV with Dr. Lake Bells for initial pulmonary consult.  Discussed that there was a possibility this could be asthma.  Unable to rule out something like bronchiectasis or chronic lung infection.  Recommended further work-up with sputum culture and AFB.  CXR was clear.  CBC with differential and IgE were ordered.  She was started on Arnuity for possible asthmatic component to symptoms.  07/30/2022: OV with Rudean Icenhour NP for follow-up after being started on Arnuity.  She also underwent work-up for recurrent productive cough, occasionally with hemoptysis.  CBC with differential showed no elevation in white blood cell count and eosinophils were 0.  IgE was normal.  Her sputum  culture did grow out haemophilus parainfluenzae.  Today, she tells me that she has not noticed any difference being on the Arnuity.  She still has discomfort in her chest and back, especially with breathing or coughing.  She continues to produce yellow-colored sputum.  She also has coughed up small amounts of blood-tinged sputum, most recent occurrence was 3 days ago.  Breathing is overall stable.  She does not feel any significant increase in her shortness of breath.  Denies any wheezing, fevers, night sweats, chills, leg swelling, calf pain.  She tells me that prior to her initial visit with Dr. Lake Bells she had completed Augmentin course; did not notice a huge difference in her symptoms. Possible ampicillin resistant - plan to treat with levaquin course. Obtain CT chest w/ contrast. May need bronchoscopy in the future if no improvement.   08/06/2022: Today - follow up Patient presents today with her husband and spanish interpretor for follow up. She was treated for suspected ampicillin resistant haemophilus parainfluenzae with levaquin 7 day course. Today, she reports that she is still coughing and feels like she has phlegm in her chest. The chest congestion causes her to cough and when she coughs, her back still hurts. Her sputum has cleared up some; it is now white, previously yellow, and she has not had any further episodes of hemoptysis. She has shortness of breath with coughing spells and some chest tightness. She does not notice any significant wheezing.  Denies any fevers, chills, night sweats.  She had been started on Arnuity after her initial visit with Dr. Lake Bells.  She is  currently using it as needed; she is not entirely sure if it helps.  She had told me at last visit that she did not feel like it did but now she is not really sure.  Feels like the albuterol works for her and helps calm her cough down.  She took her last dose of Levaquin today.  She is only using Mucinex at night.  Not taking  anything for the cough.  No Known Allergies  Immunization History  Administered Date(s) Administered   Influenza Split 10/09/2015, 10/15/2017   Influenza,inj,Quad PF,6+ Mos 10/09/2015, 10/15/2017, 01/16/2022   PFIZER(Purple Top)SARS-COV-2 Vaccination 01/19/2020, 02/11/2020    Past Medical History:  Diagnosis Date   Anemia    Ectopic pregnancy    Essential hypertension    Leiomyoma of uterus    Pneumonia    Shortness of breath dyspnea     Tobacco History: Social History   Tobacco Use  Smoking Status Never  Smokeless Tobacco Never   Counseling given: Not Answered   Outpatient Medications Prior to Visit  Medication Sig Dispense Refill   albuterol (VENTOLIN HFA) 108 (90 Base) MCG/ACT inhaler Inhale 1-2 puffs into the lungs every 6 (six) hours as needed.     amLODipine (NORVASC) 5 MG tablet Take 1 tablet (5 mg total) by mouth daily. 90 tablet 3   diclofenac (VOLTAREN) 75 MG EC tablet Take 1 tablet (75 mg total) by mouth 2 (two) times daily. 30 tablet 0   estradiol (ESTRACE) 1 MG tablet Take 1 tablet (1 mg total) by mouth daily. 90 tablet 3   Fluticasone Furoate (ARNUITY ELLIPTA) 100 MCG/ACT AEPB Inhale 1 puff into the lungs daily. 14 each 0   hydrochlorothiazide (HYDRODIURIL) 25 MG tablet Take 1 tablet (25 mg total) by mouth daily. 90 tablet 3   hydrOXYzine (VISTARIL) 100 MG capsule Take 1 capsule (100 mg total) by mouth at bedtime. 30 capsule 1   pantoprazole (PROTONIX) 40 MG tablet Take 1 tablet (40 mg total) by mouth daily. 30 tablet 1   levofloxacin (LEVAQUIN) 500 MG tablet Take 1 tablet (500 mg total) by mouth daily. 7 tablet 0   Facility-Administered Medications Prior to Visit  Medication Dose Route Frequency Provider Last Rate Last Admin   0.9 %  sodium chloride infusion  500 mL Intravenous Once Nandigam, Kavitha V, MD         Review of Systems:   Constitutional: No weight loss or gain, night sweats, fevers, chills, or lassitude. +fatigue (slightly  improved) HEENT: No headaches, difficulty swallowing, tooth/dental problems, or sore throat. No sneezing, itching, ear ache, nasal congestion, or post nasal drip CV:  No chest pain, orthopnea, PND, swelling in lower extremities, anasarca, dizziness, palpitations, syncope Resp: +chest discomfort (mild improvement), shortness of breath with exertion (baseline); productive cough (mild improvement).  No hemoptysis.  No wheezing.  No chest wall deformity GI:  No heartburn, indigestion, abdominal pain, nausea, vomiting, diarrhea, change in bowel habits, loss of appetite, bloody stools.  GU: No dysuria, change in color of urine, urgency or frequency.  No flank pain, no hematuria  Skin: No rash, lesions, ulcerations MSK:  +occasional joint pain. No joint swelling.  No decreased range of motion.  No back pain. Neuro: No dizziness or lightheadedness.  Psych: No depression or anxiety. Mood stable.     Physical Exam:  BP 122/84 (BP Location: Right Arm, Patient Position: Sitting, Cuff Size: Normal)   Pulse 81   Ht '5\' 2"'$  (1.575 m)   Wt 181 lb 6.4  oz (82.3 kg)   LMP 08/10/2015 (Approximate) Comment: continuous bleeding/spotting for months  SpO2 99%   BMI 33.18 kg/m   GEN: Pleasant, interactive, well-appearing; obese; in no acute distress. HEENT:  Normocephalic and atraumatic. PERRLA. Sclera white. Nasal turbinates pink, moist and patent bilaterally. No rhinorrhea present. Oropharynx pink and moist, without exudate or edema. No lesions, ulcerations, or postnasal drip.  NECK:  Supple w/ fair ROM. No JVD present. Normal carotid impulses w/o bruits. Thyroid symmetrical with no goiter or nodules palpated. No lymphadenopathy.   CV: RRR, no m/r/g, no peripheral edema. Pulses intact, +2 bilaterally. No cyanosis, pallor or clubbing. PULMONARY:  Unlabored, regular breathing. Clear bilaterally A&P w/o wheezes/rales/rhonchi. No accessory muscle use. No dullness to percussion. GI: BS present and normoactive. Soft,  non-tender to palpation. No organomegaly or masses detected.  MSK: No erythema, warmth or tenderness. No deformities or joint swelling noted.  Neuro: A/Ox3. No focal deficits noted.   Skin: Warm, no lesions or rashe Psych: Normal affect and behavior. Judgement and thought content appropriate.     Lab Results:  CBC    Component Value Date/Time   WBC 9.0 07/09/2022 1519   RBC 4.75 07/09/2022 1519   HGB 13.3 07/09/2022 1519   HCT 40.1 07/09/2022 1519   PLT 178.0 07/09/2022 1519   MCV 84.5 07/09/2022 1519   MCH 27.7 06/25/2016 0826   MCHC 33.2 07/09/2022 1519   RDW 15.1 07/09/2022 1519   LYMPHSABS 1.7 07/09/2022 1519   MONOABS 0.7 07/09/2022 1519   EOSABS 0.0 07/09/2022 1519   BASOSABS 0.0 07/09/2022 1519    BMET    Component Value Date/Time   NA 140 03/25/2020 1027   K 3.8 03/25/2020 1027   CL 101 03/25/2020 1027   CO2 24 03/25/2020 1027   GLUCOSE 104 (H) 03/25/2020 1027   GLUCOSE 100 (H) 06/25/2016 0826   BUN 14 03/25/2020 1027   CREATININE 0.67 03/25/2020 1027   CREATININE 0.64 06/25/2016 0826   CALCIUM 9.5 03/25/2020 1027   GFRNONAA 99 03/25/2020 1027   GFRAA 114 03/25/2020 1027    BNP No results found for: "BNP"   Imaging:  DG Chest 2 View  Result Date: 07/11/2022 CLINICAL DATA:  asthma EXAM: CHEST - 2 VIEW COMPARISON:  Chest x-ray 10/30/2019, CT chest 08/11/2015 FINDINGS: The heart and mediastinal contours are within normal limits. No focal consolidation. No pulmonary edema. No pleural effusion. No pneumothorax. No acute osseous abnormality. IMPRESSION: No active cardiopulmonary disease. Electronically Signed   By: Iven Finn M.D.   On: 07/11/2022 21:16          No data to display          No results found for: "NITRICOXIDE"      Assessment & Plan:   Haemophilus influenzae infection Recurrent productive cough with small amount of hemoptysis since December 2022.  She was treated with numerous antibiotics without significant change in  symptoms; suspected to be related to resistance to beta lactams/ampicillin.  We treated her with Levaquin 7-day course at her last visit 9/22.  She is somewhat improved today. Her sputum seems to have cleared up, less purulent and without any further hemoptysis.  We will extend out her Levaquin for another 3 days.  Do suspect that a portion of her cough and discomfort is from inflammation/irritation.  Advised that we target cough control measures to try to calm down the cough and reduce inflammation is much as possible.  We will also try a short course of prednisone to  see how she responds to this.  Awaiting CT scan results.  We will determine need for bronchoscopy based on these and response to above measures.  Patient Instructions  Continue Albuterol inhaler 2 puffs every 6 hours as needed for shortness of breath or wheezing. Notify if symptoms persist despite rescue inhaler/neb use.  Continue protonix 40 mg daily  Continue Arnuity 1 puff daily as needed for shortness of breath or wheezing. We will see how you response to prednisone and may have you start this daily   -Levaquin 1 tab daily for an additional 3 days. Take with food. Notify and stop immediately if tendon pain develops, or if you develop 3-4 watery bowel movements in a 24 hour period. Eat yogurt or take a daily probiotic while taking -Increase Mucinex 600 mg to Twice daily for chest congestion/cough  Target your cough to minimize further irritation with: -Delsym 2 tsp Twice daily for cough  -Tessalon perles (benzonatate) 1 capsule Three times a day as needed for cough  -Prednisone 40 mg daily for 5 days. Take in AM with food.    Follow up in one week with Dr. Lake Bells (1st) or Katie Lynasia Meloche,NP. If symptoms do not improve or worsen, please contact office for sooner follow up or seek emergency care.     Acute bronchitis Likely component of inflammation due to persistent coughing/recent infection contributing to her cough/chest  discomfort. See above plan.  Mild intermittent asthma without complication Again tried to obtain FeNO today. No significant response to ICS but does respond well to albuterol. She doesn't tend to like the DPI so this could be part of the reason why she limits use/receives minimal response. Previous eosinophils 0. We will see how she responds to prednisone and may consider trial changing to [facility] style ICS.     I spent 38 minutes of dedicated to the care of this patient on the date of this encounter to include pre-visit review of records, face-to-face time with the patient discussing conditions above, post visit ordering of testing, clinical documentation with the electronic health record, making appropriate referrals as documented, and communicating necessary findings to members of the patients care team.  Clayton Bibles, NP 08/06/2022  Pt aware and understands NP's role.

## 2022-08-08 ENCOUNTER — Emergency Department (HOSPITAL_COMMUNITY): Payer: Commercial Managed Care - HMO

## 2022-08-08 ENCOUNTER — Encounter (HOSPITAL_COMMUNITY): Payer: Self-pay

## 2022-08-08 ENCOUNTER — Emergency Department (HOSPITAL_COMMUNITY)
Admission: EM | Admit: 2022-08-08 | Discharge: 2022-08-08 | Disposition: A | Discharge status: Home / Self Care | Payer: Commercial Managed Care - HMO | Attending: Emergency Medicine | Admitting: Emergency Medicine

## 2022-08-08 DIAGNOSIS — R042 Hemoptysis: Secondary | ICD-10-CM | POA: Diagnosis present

## 2022-08-08 DIAGNOSIS — R059 Cough, unspecified: Secondary | ICD-10-CM | POA: Insufficient documentation

## 2022-08-08 DIAGNOSIS — R0602 Shortness of breath: Secondary | ICD-10-CM | POA: Diagnosis not present

## 2022-08-08 DIAGNOSIS — M549 Dorsalgia, unspecified: Secondary | ICD-10-CM | POA: Diagnosis not present

## 2022-08-08 DIAGNOSIS — R0789 Other chest pain: Secondary | ICD-10-CM | POA: Insufficient documentation

## 2022-08-08 LAB — COMPREHENSIVE METABOLIC PANEL
ALT: 29 U/L (ref 0–44)
AST: 27 U/L (ref 15–41)
Albumin: 4.1 g/dL (ref 3.5–5.0)
Alkaline Phosphatase: 83 U/L (ref 38–126)
Anion gap: 10 (ref 5–15)
BUN: 19 mg/dL (ref 6–20)
CO2: 24 mmol/L (ref 22–32)
Calcium: 9.3 mg/dL (ref 8.9–10.3)
Chloride: 106 mmol/L (ref 98–111)
Creatinine, Ser: 0.82 mg/dL (ref 0.44–1.00)
GFR, Estimated: >60 mL/min (ref >60)
Glucose, Bld: 117 mg/dL — ABNORMAL HIGH (ref 70–99)
Potassium: 3.3 mmol/L — ABNORMAL LOW (ref 3.5–5.1)
Sodium: 140 mmol/L (ref 135–145)
Total Bilirubin: 0.5 mg/dL (ref 0.3–1.2)
Total Protein: 7.7 g/dL (ref 6.5–8.1)

## 2022-08-08 LAB — CBC WITH DIFFERENTIAL/PLATELET
Abs Immature Granulocytes: 0.03 10*3/uL (ref 0.00–0.07)
Basophils Absolute: 0.1 10*3/uL (ref 0.0–0.1)
Basophils Relative: 1 %
Eosinophils Absolute: 0 10*3/uL (ref 0.0–0.5)
Eosinophils Relative: 1 %
HCT: 43.1 % (ref 36.0–46.0)
Hemoglobin: 14.2 g/dL (ref 12.0–15.0)
Immature Granulocytes: 0 %
Lymphocytes Relative: 26 %
Lymphs Abs: 2.2 10*3/uL (ref 0.7–4.0)
MCH: 28.4 pg (ref 26.0–34.0)
MCHC: 32.9 g/dL (ref 30.0–36.0)
MCV: 86.2 fL (ref 80.0–100.0)
Monocytes Absolute: 0.6 10*3/uL (ref 0.1–1.0)
Monocytes Relative: 8 %
Neutro Abs: 5.4 10*3/uL (ref 1.7–7.7)
Neutrophils Relative %: 64 %
Platelets: 188 10*3/uL (ref 150–400)
RBC: 5 MIL/uL (ref 3.87–5.11)
RDW: 14.4 % (ref 11.5–15.5)
WBC: 8.3 10*3/uL (ref 4.0–10.5)
nRBC: 0 % (ref 0.0–0.2)

## 2022-08-08 LAB — TROPONIN I (HIGH SENSITIVITY)
Troponin I (High Sensitivity): 36 ng/L — ABNORMAL HIGH (ref <18)
Troponin I (High Sensitivity): 38 ng/L — ABNORMAL HIGH (ref <18)

## 2022-08-08 LAB — D-DIMER, QUANTITATIVE: D-Dimer, Quant: 0.97 ug/mL-FEU — ABNORMAL HIGH (ref 0.00–0.50)

## 2022-08-08 MED ORDER — IOHEXOL 350 MG/ML SOLN
100.0000 mL | Freq: Once | INTRAVENOUS | Status: AC | PRN
  Administered 2022-08-08: 100 mL via INTRAVENOUS

## 2022-08-08 NOTE — Discharge Instructions (Signed)
Continue the Levaquin and steroids as prescribed.  Follow-up with your pulmonologist on Friday as scheduled.  They may want to schedule you a test called a bronchoscopy.  Return to the ED with difficulty breathing, chest pain, worsening coughing up blood or any other concerns.

## 2022-08-08 NOTE — ED Provider Triage Note (Signed)
Emergency Medicine Provider Triage Evaluation Note  Stephanie Gibson , a 56 y.o. female  was evaluated in triage.  Pt complains of hemoptysis.  Patient states that she has been seen outpatient multiple times for symptoms of cough as well as midthoracic back pain.  She states that symptoms of midthoracic back pain have been present for the past 3 months.  She notes increasing symptoms with consumption of food as well as with certain movements.  She states that she developed a cough over the past several days of which she was seen by her primary care provider on 9/22.  She has been treated with 2 different kinds of antibiotics with most recent being on Levaquin.  She is also being treated with Protonix as well as cough suppressants outpatient.  Hemoptysis is described as streaky without gross amounts of blood.  She states cough is worse at night.  Denies fever, chills, night sweats, chest pain, abdominal pain, nausea, vomiting.  Denies blood thinner use.  CT of chest performed on 08/05/2022  Review of Systems  Positive: See above Negative:   Physical Exam  BP 138/81 (BP Location: Left Arm)   Pulse 90   Temp 98.4 F (36.9 C) (Oral)   Resp 16   LMP 08/10/2015 (Approximate) Comment: continuous bleeding/spotting for months  SpO2 98%  Gen:   Awake, no distress   Resp:  Normal effort  MSK:   Moves extremities without difficulty  Other:  Lungs clear to auscultation.  No obvious murmurs gallops or rubs.  Medical Decision Making  Medically screening exam initiated at 1:30 PM.  Appropriate orders placed.  Stephanie Gibson was informed that the remainder of the evaluation will be completed by another provider, this initial triage assessment does not replace that evaluation, and the importance of remaining in the ED until their evaluation is complete.     [name], [place] 08/08/22 1332

## 2022-08-08 NOTE — ED Provider Notes (Signed)
[facility] DEPT Provider Note   CSN: [id number] Arrival date & time: 08/08/22  1219     History  Chief Complaint  Patient presents with   Hemoptysis    Stephanie Gibson is a 56 y.o. female.  Level 5 caveat for language barrier.  Translator is used.  Patient presents with recurrent hemoptysis over the past 2 days.  She reports has been having issues with difficulty breathing, chest pain, back pain, cough productive of bloody sputum ever since December 2022.  She has been seen by pulmonology several times and treated with antibiotics multiple times.  She last saw pulmonology 2 days ago.  She is currently on a course of Levaquin and has 1 day left.  She is being treated for a haemophilus influenza infection involving her respiratory tract.  She is also on a course of prednisone.  States she has burning pain to her back that radiates to her chest lasting for several hours at a time that comes and goes.  Not having any pain currently.  Became concerned today because she saw streaks of blood in her sputum again over the past 2 days.  No fever.  Has not left the country since December 2022.  No leg pain or leg swelling.  No blood thinner use.  Not having any chest pain currently.  Has a questionable history of asthma that was diagnosed in [place].  Currently for her breathing she is taking albuterol, fluticasone, Delsym.  Has 1 day of Levaquin left and is currently on prednisone. No night sweats or weight loss.  Chart review shows AFB sputum was collected on September 4 but no result is available.  The history is provided by the patient and the spouse. The history is limited by a language barrier. A language interpreter was used.       Home Medications Prior to Admission medications   Medication Sig Start Date End Date Taking? Authorizing Provider  albuterol (VENTOLIN HFA) 108 (90 Base) MCG/ACT inhaler Inhale 1-2 puffs into the lungs every 6 (six) hours as  needed. 02/16/19   [provider]  amLODipine (NORVASC) 5 MG tablet Take 1 tablet (5 mg total) by mouth daily. 03/25/20   Horald Pollen, MD  benzonatate (TESSALON) 200 MG capsule Take 1 capsule (200 mg total) by mouth 3 (three) times daily as needed for cough. 08/06/22   Cobb, Karie Schwalbe, NP  diclofenac (VOLTAREN) 75 MG EC tablet Take 1 tablet (75 mg total) by mouth 2 (two) times daily. 03/12/21   Horald Pollen, MD  estradiol (ESTRACE) 1 MG tablet Take 1 tablet (1 mg total) by mouth daily. 03/25/20   Horald Pollen, MD  Fluticasone Furoate (ARNUITY ELLIPTA) 100 MCG/ACT AEPB Inhale 1 puff into the lungs daily. 07/13/22   Juanito Doom, MD  hydrochlorothiazide (HYDRODIURIL) 25 MG tablet Take 1 tablet (25 mg total) by mouth daily. 03/25/20   Horald Pollen, MD  hydrOXYzine (VISTARIL) 100 MG capsule Take 1 capsule (100 mg total) by mouth at bedtime. 02/20/18   McVey, Gelene Mink, PA-C  levofloxacin (LEVAQUIN) 500 MG tablet Take 1 tablet (500 mg total) by mouth daily for 3 days. 08/06/22 08/09/22  Cobb, Karie Schwalbe, NP  pantoprazole (PROTONIX) 40 MG tablet Take 1 tablet (40 mg total) by mouth daily. 07/28/20   Horald Pollen, MD  predniSONE (DELTASONE) 20 MG tablet Take 2 tablets (40 mg total) by mouth daily with breakfast for 5 days. 08/06/22 08/11/22  Cobb, Karie Schwalbe,  NP      Allergies    Patient has no known allergies.    Review of Systems   Review of Systems  Constitutional:  Negative for activity change, appetite change and fever.  HENT:  Negative for congestion.   Respiratory:  Positive for chest tightness and shortness of breath.   Gastrointestinal:  Negative for nausea.  Genitourinary:  Negative for dysuria and hematuria.  Musculoskeletal:  Negative for arthralgias.  Neurological:  Negative for weakness and headaches.   all other systems are negative except as noted in the HPI and PMH.    Physical Exam Updated Vital Signs BP 138/81 (BP  Location: Left Arm)   Pulse 90   Temp 98.4 F (36.9 C) (Oral)   Resp 16   LMP 08/10/2015 (Approximate) Comment: continuous bleeding/spotting for months  SpO2 98%  Physical Exam Vitals and nursing note reviewed.  Constitutional:      General: She is not in acute distress.    Appearance: She is well-developed.  HENT:     Head: Normocephalic and atraumatic.     Mouth/Throat:     Pharynx: No oropharyngeal exudate.     Comments: OP clear. No blood seen. Uvula midline Eyes:     Conjunctiva/sclera: Conjunctivae normal.     Pupils: Pupils are equal, round, and reactive to light.  Neck:     Comments: No meningismus. Cardiovascular:     Rate and Rhythm: Normal rate and regular rhythm.     Heart sounds: Normal heart sounds. No murmur heard. Pulmonary:     Effort: Pulmonary effort is normal. No respiratory distress.     Breath sounds: Normal breath sounds.  Chest:     Chest wall: No tenderness.  Abdominal:     Palpations: Abdomen is soft.     Tenderness: There is no abdominal tenderness. There is no guarding or rebound.  Musculoskeletal:        General: No tenderness. Normal range of motion.     Cervical back: Normal range of motion and neck supple.  Skin:    General: Skin is warm.  Neurological:     Mental Status: She is alert and oriented to person, place, and time.     Cranial Nerves: No cranial nerve deficit.     Motor: No abnormal muscle tone.     Coordination: Coordination normal.     Comments:  5/5 strength throughout. CN 2-12 intact.Equal grip strength.   Psychiatric:        Behavior: Behavior normal.     ED Results / Procedures / Treatments   Labs (all labs ordered are listed, but only abnormal results are displayed) Labs Reviewed  COMPREHENSIVE METABOLIC PANEL - Abnormal; Notable for the following components:      Result Value   Potassium 3.3 (*)    Glucose, Bld 117 (*)    All other components within normal limits  D-DIMER, QUANTITATIVE - Abnormal; Notable for  the following components:   D-Dimer, Quant 0.97 (*)    All other components within normal limits  TROPONIN I (HIGH SENSITIVITY) - Abnormal; Notable for the following components:   Troponin I (High Sensitivity) 36 (*)    All other components within normal limits  TROPONIN I (HIGH SENSITIVITY) - Abnormal; Notable for the following components:   Troponin I (High Sensitivity) 38 (*)    All other components within normal limits  CBC WITH DIFFERENTIAL/PLATELET    EKG EKG Interpretation  Date/Time:  Sunday August 08 2022 17:20:09 EDT Ventricular Rate:  72 PR Interval:  172 QRS Duration: 101 QT Interval:  410 QTC Calculation: 449 R Axis:   55 Text Interpretation: Sinus rhythm Probable left ventricular hypertrophy Anterior ST elevation, probably due to LVH No significant change was found Confirmed by Ezequiel Essex [phone]) on 08/08/2022 5:24:56 PM  Radiology CT Angio Chest PE W and/or Wo Contrast  Result Date: 08/08/2022 CLINICAL DATA:  Suspected pulmonary embolism. Hemoptysis EXAM: CT ANGIOGRAPHY CHEST WITH CONTRAST TECHNIQUE: Multidetector CT imaging of the chest was performed using the standard protocol during bolus administration of intravenous contrast. Multiplanar CT image reconstructions and MIPs were obtained to evaluate the vascular anatomy. RADIATION DOSE REDUCTION: This exam was performed according to the departmental dose-optimization program which includes automated exposure control, adjustment of the mA and/or kV according to patient size and/or use of iterative reconstruction technique. CONTRAST:  133m OMNIPAQUE IOHEXOL 350 MG/ML SOLN COMPARISON:  CT chest 08/05/2022 FINDINGS: Cardiovascular: Satisfactory opacification of the pulmonary arteries to the segmental level. No evidence of pulmonary embolism. Evaluation is mildly limited due to respiratory motion. Normal heart size. No pericardial effusion. Mediastinum/Nodes: No enlarged mediastinal, hilar, or axillary lymph nodes.  Thyroid gland, trachea, and esophagus demonstrate no significant findings. Lungs/Pleura: Evaluation of the lungs limited due to respiratory motion. Minimal bibasilar atelectasis. No focal airspace opacity to indicate pneumonia. 4 mm nodule in the right middle lobe (67/11) is unchanged since 08/11/2015 which indicates a benign etiology. Upper Abdomen: Small hiatal hernia. Diffuse fatty atrophy of the pancreas. Diffuse hepatic steatosis. Musculoskeletal: No chest wall abnormality. No acute or significant osseous findings. Review of the MIP images confirms the above findings. IMPRESSION: 1. No pulmonary artery embolism. 2. Minimal bibasilar atelectasis.  No evidence of pneumonia. Electronically Signed   By: FMiachel RouxM.D.   On: 08/08/2022 19:14   DG Chest 2 View  Result Date: 08/08/2022 CLINICAL DATA:  Cough for 6 months. Hemoptysis this morning. Central chest pain. EXAM: CHEST - 2 VIEW COMPARISON:  07/09/2022.  CT, 08/06/2019 FINDINGS: Cardiac silhouette is normal in size and configuration. Normal mediastinal and hilar contours. Clear lungs.  No pleural effusion or pneumothorax. Skeletal structures are unremarkable. IMPRESSION: No active cardiopulmonary disease. Electronically Signed   By: DLajean ManesM.D.   On: 08/08/2022 14:15    Procedures Procedures    Medications Ordered in ED Medications - No data to display  ED Course/ Medical Decision Making/ A&P                           Medical Decision Making Amount and/or Complexity of Data Reviewed Labs: ordered. Decision-making details documented in ED Course. Radiology: ordered and independent interpretation performed. Decision-making details documented in ED Course. ECG/medicine tests: ordered and independent interpretation performed. Decision-making details documented in ED Course.  Risk Prescription drug management.   Recurrent hemoptysis for several months.  Currently being treated for haemophilus influenza infection with Levaquin.   She is in no distress and not hypoxic.  No gross hemoptysis here.  Chest x-ray shows no masses or infiltrate.  Results reviewed and interpreted by me.  Recent pulmonology records reviewed.  She had a CT scan on September 28 that showed no masses or infiltrates.  D/w Dr. OAnder Sladeof pulmonology covering for Dr. MLake Bells  He feels not much to offer him in the acute basis given patient's recurrent symptoms and hemodynamic stability.  Recommends continuing her Levaquin and prednisone and following up as scheduled for likely bronchoscopy.  No indication for emergent bronchoscopy  today.  Remains flat.  No evidence of ACS.  EKG is nonischemic.  CT angiogram shows no pulmonary embolism or pneumonia or pulmonary mass.  Discussed with patient and husband at bedside with translator.  Follow-up as scheduled this Friday with her pulmonologist.  She may benefit from having a bronchoscopy.  No indication for admission today or emergent bronchoscopy.  Continue her antibiotics and steroids as prescribed.  Follow-up with her pulmonologist as scheduled.  Return to the ED with new or worsening symptoms.        Final Clinical Impression(s) / ED Diagnoses Final diagnoses:  None    Rx / DC Orders ED Discharge Orders     None         Ronetta Molla, Annie Main, MD 08/08/22 2005

## 2022-08-08 NOTE — ED Triage Notes (Signed)
Pt reports she is currently on medication for bacteria in her respiratory tract and has been coughing up blood for several days.

## 2022-08-11 ENCOUNTER — Telehealth: Payer: Self-pay | Admitting: *Deleted

## 2022-08-11 NOTE — Telephone Encounter (Signed)
Patient is no longer a patient of Dr. Mitchel Honour. She has a new PCP

## 2022-08-13 ENCOUNTER — Encounter: Payer: Self-pay | Admitting: Nurse Practitioner

## 2022-08-13 ENCOUNTER — Ambulatory Visit: Payer: Commercial Managed Care - HMO | Admitting: Nurse Practitioner

## 2022-08-13 VITALS — BP 132/80 | HR 79 | Temp 97.9°F | Ht 62.0 in | Wt 185.6 lb

## 2022-08-13 DIAGNOSIS — R0982 Postnasal drip: Secondary | ICD-10-CM | POA: Insufficient documentation

## 2022-08-13 DIAGNOSIS — A492 Hemophilus influenzae infection, unspecified site: Secondary | ICD-10-CM | POA: Diagnosis not present

## 2022-08-13 DIAGNOSIS — J4520 Mild intermittent asthma, uncomplicated: Secondary | ICD-10-CM | POA: Diagnosis not present

## 2022-08-13 DIAGNOSIS — R053 Chronic cough: Secondary | ICD-10-CM | POA: Diagnosis not present

## 2022-08-13 MED ORDER — FLUTICASONE PROPIONATE 50 MCG/ACT NA SUSP: 2.0000 | Freq: Every day | NASAL | 2 refills | Status: AC

## 2022-08-13 MED ORDER — FLUTICASONE-SALMETEROL 115-21 MCG/ACT IN AERO: 2.0000 | INHALATION_SPRAY | Freq: Two times a day (BID) | RESPIRATORY_TRACT | 5 refills | Status: DC

## 2022-08-13 NOTE — Assessment & Plan Note (Signed)
Globus sensation and feels like cough is worse at night. Nasal passages inflamed on exam. Possible postnasal drip contributing to her symptoms. Denies any epistaxis to explain hemoptysis. Recommended she start intranasal steroid for postnasal drainage control.

## 2022-08-13 NOTE — Assessment & Plan Note (Signed)
Recurrent productive cough with small amount of hemoptysis since December 2022. She was treated with numerous antibiotics without significant change in symptoms; suspected to be related to resistance to beta lactams/ampicillin.  We treated her with Levaquin 7-day course 9/22 and extended it to 10 day course at her visit on 9/29. She was slowly improving and hadn't had any recurrence of hemoptysis. Unfortunately, she has had another episode of small amount of blood streaked sputum. She was seen in ED on 10/1 and CTA was negative for PE or acute process. Recommended we move forward with bronchoscopy for further evaluation. Will consult with Dr. Lake Bells.  Do suspect that a portion of her cough and discomfort is from inflammation/irritation as she did have some response to prednisone.  I would like for her to target cough control measures to try to calm down the cough and reduce inflammation is much as possible in interim.  Patient Instructions  Continue Albuterol inhaler 2 puffs every 6 hours as needed for shortness of breath or wheezing. Notify if symptoms persist despite rescue inhaler/neb use.  Continue protonix 40 mg daily   -Continue Mucinex 600 mg to Twice daily for chest congestion/cough -Continue delsym 2 tsp Twice daily for cough  -Continue Tessalon perles (benzonatate) 1 capsule Three times a day as needed for cough   Start Advair 2 puffs Twice daily. Brush tongue and rinse mouth afterwards Flonase nasal spray 2 sprays each nostril daily    We will likely get you set up for bronchoscopy; I will discuss with Dr. Lake Bells and then we will contact you regarding next steps.   If symptoms do not improve or worsen, please contact office for sooner follow up or seek emergency care.

## 2022-08-13 NOTE — Assessment & Plan Note (Addendum)
She didn't have a very good response to Arnuity but concern that this could have been related to poor technique or inconsistent use. She did have some response to prednisone and constellation of symptoms do align with asthma. Good response to albuterol. Recommended we trial her on scheduled ICS/LABA to see how she responds.

## 2022-08-13 NOTE — Patient Instructions (Addendum)
Continue Albuterol inhaler 2 puffs every 6 hours as needed for shortness of breath or wheezing. Notify if symptoms persist despite rescue inhaler/neb use.  Continue protonix 40 mg daily   -Continue Mucinex 600 mg to Twice daily for chest congestion/cough -Continue delsym 2 tsp Twice daily for cough  -Continue Tessalon perles (benzonatate) 1 capsule Three times a day as needed for cough   Start Advair 2 puffs Twice daily. Brush tongue and rinse mouth afterwards Flonase nasal spray 2 sprays each nostril daily    We will likely get you set up for bronchoscopy; I will discuss with Dr. Lake Bells and then we will contact you regarding next steps.   If symptoms do not improve or worsen, please contact office for sooner follow up or seek emergency care.

## 2022-08-13 NOTE — Progress Notes (Signed)
$'@Patient'e$  ID: Stephanie Gibson, female    DOB: 1966/05/30, 56 y.o.   MRN: [id number]  Chief Complaint  Patient presents with   Follow-up    Referring provider: Chrystie Nose  HPI: 56 year old female, never smoker followed for dyspnea, chronic bronchitis and hemoptysis.  She is a patient of Dr. Anastasia Pall and last seen in office 08/06/2022 by [facility] NP.  Past medical history significant for hypertension, osteoarthritis, seasonal allergies.  She was initially referred due to persistent dyspnea, back pain, chest pain and fatigue.  Was feeling like she was having a lot of chest pressure.  Symptoms initially started in December when she went to [place], but reoccurred numerous times afterwards.  She was treated with amoxicillin and prescribed an inhaler.  She was told that she may have asthma when she was in [place].  She was also provided with an albuterol inhaler and started on Singulair.  Treated with antibiotics 3-4 times since.  She did not have any history of lung problems.  She reported a persistent cough with phlegm every day.  Occasionally with some blood in it.  TEST/EVENTS:  07/09/2022: Eos 0, IgE normal 07/09/2022: Sputum culture with heavy growth of haemophilus parainfluenzae 07/09/2022: Lungs are clear 08/05/2022 CT chest with contrast: No LAD.  Trachea and mainstem bronchi are patent.  There is a subtle 5 mm density in the right lung which is stable and compatible with benign finding.  3 mm nodular density in the left lower lobe which is also stable and considered benign.  No acute process identified. 08/08/2022 CTA chest: No evidence of PE.  Minimal bibasilar atelectasis.  No focal airspace opacity to indicate pneumonia.  4 mm nodule in the right middle lobe is unchanged and indicates benign etiology.  07/09/2022: OV with Dr. Lake Bells for initial pulmonary consult.  Discussed that there was a possibility this could be asthma.  Unable to rule out something like bronchiectasis or chronic lung  infection.  Recommended further work-up with sputum culture and AFB.  CXR was clear.  CBC with differential and IgE were ordered.  She was started on Arnuity for possible asthmatic component to symptoms.  07/30/2022: OV with Diar Berkel NP for follow-up after being started on Arnuity.  She also underwent work-up for recurrent productive cough, occasionally with hemoptysis.  CBC with differential showed no elevation in white blood cell count and eosinophils were 0.  IgE was normal.  Her sputum culture did grow out haemophilus parainfluenzae.  Today, she tells me that she has not noticed any difference being on the Arnuity.  She still has discomfort in her chest and back, especially with breathing or coughing.  She continues to produce yellow-colored sputum.  She also has coughed up small amounts of blood-tinged sputum, most recent occurrence was 3 days ago.  Breathing is overall stable.  She does not feel any significant increase in her shortness of breath.  Denies any wheezing, fevers, night sweats, chills, leg swelling, calf pain.  She tells me that prior to her initial visit with Dr. Lake Bells she had completed Augmentin course; did not notice a huge difference in her symptoms. Possible ampicillin resistant - plan to treat with levaquin course. Obtain CT chest w/ contrast. May need bronchoscopy in the future if no improvement.   08/06/2022: OV with Tammala Weider NP for follow up. She was treated for suspected ampicillin resistant haemophilus parainfluenzae with levaquin 7 day course. Today, she reports that she is still coughing and feels like she has phlegm in her chest.  The chest congestion causes her to cough and when she coughs, her back still hurts. Her sputum has cleared up some; it is now white, previously yellow, and she has not had any further episodes of hemoptysis. She has shortness of breath with coughing spells and some chest tightness. She does not notice any significant wheezing.  Denies any fevers, chills, night  sweats.  She had been started on Arnuity after her initial visit with Dr. Lake Bells.  She is currently using it as needed; she is not entirely sure if it helps.  She had told me at last visit that she did not feel like it did but now she is not really sure.  Feels like the albuterol works for her and helps calm her cough down.  She took her last dose of Levaquin today.  She is only using Mucinex at night.  Not taking anything for the cough. Extended levaquin course for additional 3 days. Target cough control measures. Prednisone burst for airway irritation/inflammation. Unable to complete FeNO. Advised she use Arnuity more consistently to see if she receives any benefit.   08/13/2022: Today - follow up Patient presents today for follow up. Unfortunately, after she was seen last, she had an episode of hemoptysis on 10/1 and went to the ED. She reported it as a small amount of streaked blood in her sputum. Denied any other worsening respiratory symptoms. CTA from her visit was unremarkable. They discharged her home without any changes. Today, she reports feeling a little bit better. She's still having some chest congestion/phlegm. Sputum is white; cleared up with levaquin course. Tells me that she sometimes has chest tightness, worse at night. She also feels like her phlegm is worse at night; feels it in the back of her throat. She feels like her breathing is okay. 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 She's worried about being able to continue to work there. Wants to be out of work until she knows further what's going on since it makes her feel worse. No further episodes of hemoptysis. Denies fevers, chills, night sweats, orthopnea, leg swelling. She does feel like the prednisone helped some. She's not been using the delsym cough syrup or tessalon consistently. Still taking mucinex but only at night. She hasn't used the arnuity much. She is  using the albuterol, which helps.   No Known Allergies  Immunization History  Administered Date(s) Administered   Influenza Split 10/09/2015, 10/15/2017   Influenza,inj,Quad PF,6+ Mos 10/09/2015, 10/15/2017, 01/16/2022   PFIZER(Purple Top)SARS-COV-2 Vaccination 01/19/2020, 02/11/2020    Past Medical History:  Diagnosis Date   Anemia    Ectopic pregnancy    Essential hypertension    Leiomyoma of uterus    Pneumonia    Shortness of breath dyspnea     Tobacco History: Social History   Tobacco Use  Smoking Status Never  Smokeless Tobacco Never   Counseling given: Not Answered   Outpatient Medications Prior to Visit  Medication Sig Dispense Refill   albuterol (VENTOLIN HFA) 108 (90 Base) MCG/ACT inhaler Inhale 1-2 puffs into the lungs every 6 (six) hours as needed.     amLODipine (NORVASC) 5 MG tablet Take 1 tablet (5 mg total) by mouth daily. 90 tablet 3   benzonatate (TESSALON) 200 MG capsule Take 1 capsule (200 mg total) by mouth 3 (three) times daily as needed for cough. 30 capsule 1   diclofenac (VOLTAREN) 75 MG  EC tablet Take 1 tablet (75 mg total) by mouth 2 (two) times daily. 30 tablet 0   estradiol (ESTRACE) 1 MG tablet Take 1 tablet (1 mg total) by mouth daily. 90 tablet 3   hydrochlorothiazide (HYDRODIURIL) 25 MG tablet Take 1 tablet (25 mg total) by mouth daily. 90 tablet 3   hydrOXYzine (VISTARIL) 100 MG capsule Take 1 capsule (100 mg total) by mouth at bedtime. 30 capsule 1   pantoprazole (PROTONIX) 40 MG tablet Take 1 tablet (40 mg total) by mouth daily. 30 tablet 1   Fluticasone Furoate (ARNUITY ELLIPTA) 100 MCG/ACT AEPB Inhale 1 puff into the lungs daily. 14 each 0   Facility-Administered Medications Prior to Visit  Medication Dose Route Frequency Provider Last Rate Last Admin   0.9 %  sodium chloride infusion  500 mL Intravenous Once Nandigam, Kavitha V, MD         Review of Systems:   Constitutional: No weight loss or gain, night sweats, fevers,  chills, or lassitude. +fatigue (improved) HEENT: No headaches, difficulty swallowing, tooth/dental problems, or sore throat. No sneezing, itching, ear ache, nasal congestion, or post nasal drip. +occasional globus sensation CV:  No chest pain, orthopnea, PND, swelling in lower extremities, anasarca, dizziness, palpitations, syncope Resp: +chest congestion (mild improvement), shortness of breath with exertion (baseline); productive cough (mild improvement).  +hemoptysis x 1 on 10/1, no recurrence since.  No wheezing.  No chest wall deformity GI:  No heartburn, indigestion, abdominal pain, nausea, vomiting, diarrhea, change in bowel habits, loss of appetite, bloody stools.  MSK:  +occasional joint pain. No joint swelling.  No decreased range of motion.  No back pain. Neuro: No dizziness or lightheadedness.  Psych: No depression or anxiety. Mood stable.     Physical Exam:  BP 132/80 (BP Location: Right Arm, Cuff Size: Normal)   Pulse 79   Temp 97.9 F (36.6 C)   Ht '5\' 2"'$  (1.575 m)   Wt 185 lb 9.6 oz (84.2 kg)   LMP 08/10/2015 (Approximate) Comment: continuous bleeding/spotting for months  SpO2 100%   BMI 33.95 kg/m   GEN: Pleasant, interactive, well-appearing; obese; in no acute distress. HEENT:  Normocephalic and atraumatic. PERRLA. Sclera white. Nasal turbinates erythematous, moist and patent bilaterally. No rhinorrhea present. Oropharynx pink and moist, without exudate or edema. No lesions, ulcerations NECK:  Supple w/ fair ROM. No JVD present. Normal carotid impulses w/o bruits. Thyroid symmetrical with no goiter or nodules palpated. No lymphadenopathy.   CV: RRR, no m/r/g, no peripheral edema. Pulses intact, +2 bilaterally. No cyanosis, pallor or clubbing. PULMONARY:  Unlabored, regular breathing. Clear bilaterally A&P w/o wheezes/rales/rhonchi. No accessory muscle use. No dullness to percussion. GI: BS present and normoactive. Soft, non-tender to palpation. No organomegaly or masses  detected.  MSK: No erythema, warmth or tenderness. No deformities or joint swelling noted.  Neuro: A/Ox3. No focal deficits noted.   Skin: Warm, no lesions or rashe Psych: Normal affect and behavior. Judgement and thought content appropriate.     Lab Results:  CBC    Component Value Date/Time   WBC 8.3 08/08/2022 1617   RBC 5.00 08/08/2022 1617   HGB 14.2 08/08/2022 1617   HCT 43.1 08/08/2022 1617   PLT 188 08/08/2022 1617   MCV 86.2 08/08/2022 1617   MCH 28.4 08/08/2022 1617   MCHC 32.9 08/08/2022 1617   RDW 14.4 08/08/2022 1617   LYMPHSABS 2.2 08/08/2022 1617   MONOABS 0.6 08/08/2022 1617   EOSABS 0.0 08/08/2022 1617   BASOSABS  0.1 08/08/2022 1617    BMET    Component Value Date/Time   NA 140 08/08/2022 1617   NA 140 03/25/2020 1027   K 3.3 (L) 08/08/2022 1617   CL 106 08/08/2022 1617   CO2 24 08/08/2022 1617   GLUCOSE 117 (H) 08/08/2022 1617   BUN 19 08/08/2022 1617   BUN 14 03/25/2020 1027   CREATININE 0.82 08/08/2022 1617   CREATININE 0.64 06/25/2016 0826   CALCIUM 9.3 08/08/2022 1617   GFRNONAA >60 08/08/2022 1617   GFRAA 114 03/25/2020 1027    BNP No results found for: "BNP"   Imaging:  CT Angio Chest PE W and/or Wo Contrast  Result Date: 08/08/2022 CLINICAL DATA:  Suspected pulmonary embolism. Hemoptysis EXAM: CT ANGIOGRAPHY CHEST WITH CONTRAST TECHNIQUE: Multidetector CT imaging of the chest was performed using the standard protocol during bolus administration of intravenous contrast. Multiplanar CT image reconstructions and MIPs were obtained to evaluate the vascular anatomy. RADIATION DOSE REDUCTION: This exam was performed according to the departmental dose-optimization program which includes automated exposure control, adjustment of the mA and/or kV according to patient size and/or use of iterative reconstruction technique. CONTRAST:  175m OMNIPAQUE IOHEXOL 350 MG/ML SOLN COMPARISON:  CT chest 08/05/2022 FINDINGS: Cardiovascular: Satisfactory  opacification of the pulmonary arteries to the segmental level. No evidence of pulmonary embolism. Evaluation is mildly limited due to respiratory motion. Normal heart size. No pericardial effusion. Mediastinum/Nodes: No enlarged mediastinal, hilar, or axillary lymph nodes. Thyroid gland, trachea, and esophagus demonstrate no significant findings. Lungs/Pleura: Evaluation of the lungs limited due to respiratory motion. Minimal bibasilar atelectasis. No focal airspace opacity to indicate pneumonia. 4 mm nodule in the right middle lobe (67/11) is unchanged since 08/11/2015 which indicates a benign etiology. Upper Abdomen: Small hiatal hernia. Diffuse fatty atrophy of the pancreas. Diffuse hepatic steatosis. Musculoskeletal: No chest wall abnormality. No acute or significant osseous findings. Review of the MIP images confirms the above findings. IMPRESSION: 1. No pulmonary artery embolism. 2. Minimal bibasilar atelectasis.  No evidence of pneumonia. Electronically Signed   By: FMiachel RouxM.D.   On: 08/08/2022 19:14   DG Chest 2 View  Result Date: 08/08/2022 CLINICAL DATA:  Cough for 6 months. Hemoptysis this morning. Central chest pain. EXAM: CHEST - 2 VIEW COMPARISON:  07/09/2022.  CT, 08/06/2019 FINDINGS: Cardiac silhouette is normal in size and configuration. Normal mediastinal and hilar contours. Clear lungs.  No pleural effusion or pneumothorax. Skeletal structures are unremarkable. IMPRESSION: No active cardiopulmonary disease. Electronically Signed   By: DLajean ManesM.D.   On: 08/08/2022 14:15   CT Chest W Contrast  Result Date: 08/07/2022 CLINICAL DATA:  56year old with hemoptysis.  Pneumonia. EXAM: CT CHEST WITH CONTRAST TECHNIQUE: Multidetector CT imaging of the chest was performed during intravenous contrast administration. RADIATION DOSE REDUCTION: This exam was performed according to the departmental dose-optimization program which includes automated exposure control, adjustment of the mA  and/or kV according to patient size and/or use of iterative reconstruction technique. CONTRAST:  752mISOVUE-300 IOPAMIDOL (ISOVUE-300) INJECTION 61% COMPARISON:  Chest CTA 08/11/2015 FINDINGS: Cardiovascular: No significant vascular findings. Normal heart size. No pericardial effusion. Mediastinum/Nodes: No enlarged mediastinal, hilar, or axillary lymph nodes. Thyroid gland, trachea, and esophagus demonstrate no significant findings. Lungs/Pleura: Trachea and mainstem bronchi are patent. Motion artifact in the lungs. No pleural effusions. Subtle 5 mm density in the right lung on sequence 8 image 78 is stable and this is compatible with a benign finding based on the stability. No airspace disease or  lung consolidation. 3 mm nodular density in the left lower lobe on sequence 8 image 64 is stable and compatible with a benign finding. Upper Abdomen: Low-density in the visualized liver. Findings are concerning for underlying steatosis. Motion artifact in the upper abdomen. Musculoskeletal: No acute bone abnormality. IMPRESSION: 1. No acute chest abnormality. Specifically, no evidence for pneumonia. No CT findings to explain hemoptysis. 2. Decreased attenuation in the visualized liver. Findings are concerning for underlying steatosis. Electronically Signed   By: Markus Daft M.D.   On: 08/07/2022 10:17          No data to display          No results found for: "NITRICOXIDE"      Assessment & Plan:   Haemophilus influenzae infection Recurrent productive cough with small amount of hemoptysis since December 2022.  She was treated with numerous antibiotics without significant change in symptoms; suspected to be related to resistance to beta lactams/ampicillin.  We treated her with Levaquin 7-day course 9/22 and extended it to 10 day course at her visit on 9/29. She was slowly improving and hadn't had any recurrence of hemoptysis. Unfortunately, she has had another episode of small amount of blood streaked  sputum. She was seen in ED on 10/1 and CTA was negative for PE or acute process. Recommended we move forward with bronchoscopy for further evaluation. Will consult with Dr. Lake Bells.  Do suspect that a portion of her cough and discomfort is from inflammation/irritation as she did have some response to prednisone.  I would like for her to target cough control measures to try to calm down the cough and reduce inflammation is much as possible in interim.  Patient Instructions  Continue Albuterol inhaler 2 puffs every 6 hours as needed for shortness of breath or wheezing. Notify if symptoms persist despite rescue inhaler/neb use.  Continue protonix 40 mg daily   -Continue Mucinex 600 mg to Twice daily for chest congestion/cough -Continue delsym 2 tsp Twice daily for cough  -Continue Tessalon perles (benzonatate) 1 capsule Three times a day as needed for cough   Start Advair 2 puffs Twice daily. Brush tongue and rinse mouth afterwards Flonase nasal spray 2 sprays each nostril daily    We will likely get you set up for bronchoscopy; I will discuss with Dr. Lake Bells and then we will contact you regarding next steps.   If symptoms do not improve or worsen, please contact office for sooner follow up or seek emergency care.    Mild intermittent asthma without complication She didn't have a very good response to Arnuity but concern that this could have been related to poor technique or inconsistent use. She did have some response to prednisone and constellation of symptoms do align with asthma. Good response to albuterol. Recommended we trial her on scheduled ICS/LABA to see how she responds.   Postnasal drip Globus sensation and feels like cough is worse at night. Nasal passages inflamed on exam. Possible postnasal drip contributing to her symptoms. Denies any epistaxis to explain hemoptysis. Recommended she start intranasal steroid for postnasal drainage control.    I spent 38 minutes of dedicated to  the care of this patient on the date of this encounter to include pre-visit review of records, face-to-face time with the patient discussing conditions above, post visit ordering of testing, clinical documentation with the electronic health record, making appropriate referrals as documented, and communicating necessary findings to members of the patients care team.  Clayton Bibles, NP 08/13/2022  Pt aware and understands NP's role.

## 2022-08-17 ENCOUNTER — Telehealth: Payer: Self-pay | Admitting: Nurse Practitioner

## 2022-08-17 ENCOUNTER — Encounter: Payer: Self-pay | Admitting: Pulmonary Disease

## 2022-08-17 ENCOUNTER — Other Ambulatory Visit: Payer: Self-pay | Admitting: Pulmonary Disease

## 2022-08-17 DIAGNOSIS — R042 Hemoptysis: Secondary | ICD-10-CM

## 2022-08-17 NOTE — Telephone Encounter (Signed)
Stephanie Gibson - this will need to be put on the template.  It gives me the information they will ask me when I call to schedule.

## 2022-08-17 NOTE — Telephone Encounter (Signed)
Dr Lake Bells - can you complete the template and put in an order for me to get this scheduled please.  Katie isn't familiar with the template and I told her I would route to you.

## 2022-08-18 NOTE — Telephone Encounter (Signed)
Pt has been scheduled.  Nothing further needed.

## 2022-08-20 ENCOUNTER — Telehealth: Payer: Self-pay | Admitting: Nurse Practitioner

## 2022-08-20 DIAGNOSIS — J4520 Mild intermittent asthma, uncomplicated: Secondary | ICD-10-CM

## 2022-08-20 MED ORDER — FLUTICASONE-SALMETEROL 100-50 MCG/ACT IN AEPB: 1.0000 | INHALATION_SPRAY | Freq: Two times a day (BID) | RESPIRATORY_TRACT | 5 refills | Status: DC

## 2022-08-20 NOTE — Telephone Encounter (Signed)
Wixela 1 puff Twice daily sent to her pharmacy. Thanks.

## 2022-08-20 NOTE — Telephone Encounter (Signed)
Fax received from pt's pharmacy stating that Advair HFA is not on formulary list with pt's insurance. Preferred products are Wixela or Breo.  Katie, please advise.

## 2022-08-20 NOTE — Telephone Encounter (Signed)
Called pt's pharmacy letting them know that we had sent Rx for [facility] for pt and they verbalized understanding. Nothing further needed.

## 2022-08-27 ENCOUNTER — Ambulatory Visit: Payer: Commercial Managed Care - HMO | Admitting: Pulmonary Disease

## 2022-08-28 LAB — AFB CULTURE WITH SMEAR (NOT AT ARMC)
Acid Fast Culture: NEGATIVE
Acid Fast Smear: NEGATIVE

## 2022-08-31 ENCOUNTER — Encounter (HOSPITAL_COMMUNITY): Payer: Self-pay | Admitting: Pulmonary Disease

## 2022-08-31 NOTE — Progress Notes (Signed)
Anesthesia Chart Review: Stephanie Gibson  Case: [id number] Date/Time: 09/01/22 1400   Procedure: VIDEO BRONCHOSCOPY WITHOUT FLUORO   Anesthesia type: Monitor Anesthesia Care   Pre-op diagnosis: [place] and hemopatsis   Location: [facility] / [facility]   Surgeons: Juanito Doom, MD       DISCUSSION: Patient is a 56 year old female scheduled for the above procedure.  History includes never smoker, HTN, exertional dyspnea, hysterectomy (10/21/15), COVID-19 (10/2019). A1c 6.0% on 01/16/22 ([place])  She was referred to pulmonologist Dr. Lake Bells by Mila Palmer, PA ([facility]) for recurrent respiratory infections requiring multiple rounds of antibiotics. Associated symptoms of SOB, intermittent wheezing, chest pain, weight loss, and intermittent hemoptysis. By notes, she had COVID-19 in 10/2019. She has had increased phlegm for two years.+ Snoring. She was told in [place] she may have asthma. CXR on 05/29/22 showed peribronchial thickening, more attributable to RAD versus viral infection. Respiratory cultures from initial visit on 07/09/22 showed heavy growth of haemophilus parainfluenza. IgE normal. She complete Augmentin and was started on Arnuity but did not notice a big change in her symptoms. She was changed to Levaquin x 7 days. PPI and as needed albuterol and Mucinex continued. Treated for acute bronchitis at 08/06/22 follow-up with addition of prednisone, Delsym, Tessalon, and increase in Mucinex. Seen in ED on 08/08/22 for hemoptysis with scheduled pulmonology follow-up on 08/13/22. There was some improvement with steroids. Wixela added. Discussed moving forward with bronchoscopy given her recurrent productive cough with intermittent hemoptysis since 10/2021. 08/08/22 CTA chest showed a stable (since 08/11/15) 4 mm RML lung nodules and considered benign, and was negative for PE and PNA. AFB culture negative from 07/12/22.   She is a same day work-up, so  anesthesia team to evaluate on the day of surgery. She had a CBC, CMP, EKG on 08/08/22 during ED evaluation for hemoptysis.    VS: LMP 08/10/2015 (Approximate) Comment: continuous bleeding/spotting for months BP Readings from Last 3 Encounters:  08/13/22 132/80  08/08/22 132/70  08/06/22 122/84   Pulse Readings from Last 3 Encounters:  08/13/22 79  08/08/22 66  08/06/22 81     PROVIDERS: Juanito Doom, MD is pulmonologist - She is not followed routinely by cardiology, but she had cardiology evaluation by Sanda Klein, MD around Fall 2016 for chest pain and exertional dyspnea, but symptoms also in the setting of new anemia (HGB 12->8) from menorrhagia. Non-ischemic stress test and unremarkable echo in 08/2015. She went on to have a hysterectomy 10/21/15.    LABS: Lab Results  Component Value Date   WBC 8.3 08/08/2022   HGB 14.2 08/08/2022   HCT 43.1 08/08/2022   PLT 188 08/08/2022   GLUCOSE 117 (H) 08/08/2022   ALT 29 08/08/2022   AST 27 08/08/2022   NA 140 08/08/2022   K 3.3 (L) 08/08/2022   CL 106 08/08/2022   CREATININE 0.82 08/08/2022   BUN 19 08/08/2022   CO2 24 08/08/2022     IMAGES: CTA Chest 08/08/22: IMPRESSION: 1. No pulmonary artery embolism. 2. Minimal bibasilar atelectasis.  No evidence of pneumonia.  CXR 08/08/22: FINDINGS: - Cardiac silhouette is normal in size and configuration. Normal mediastinal and hilar contours. - Clear lungs.  No pleural effusion or pneumothorax. - Skeletal structures are unremarkable. IMPRESSION: No active cardiopulmonary disease.  CT Chest 08/05/22: IMPRESSION: 1. No acute chest abnormality. Specifically, no evidence for pneumonia. No CT findings to explain hemoptysis. 2. Decreased attenuation in the visualized liver. Findings  are concerning for underlying steatosis.    EKG: 08/08/22: Sinus rhythm Probable left ventricular hypertrophy Anterior ST elevation, probably due to LVH No significant change was  found Confirmed by Ezequiel Essex [phone]) on 08/08/2022 5:24:56 PM   CV: Nuclear stress test 08/13/15: There was no ST segment deviation noted during stress. The study is normal. The left ventricular ejection fraction is normal (55-65%).   1. Prominent subdiaphragmatic uptake, but there does not appear to be a significant perfusion defect.  No evidence for ischemia or infarction.  2. Normal EF and wall motion.  3. Normal study.     Echo 08/12/15: Study Conclusions  - Left ventricle: The cavity size was normal. Systolic function was    normal. The estimated ejection fraction was in the range of 60%    to 65%. Wall motion was normal; there were no regional wall    motion abnormalities.   Past Medical History:  Diagnosis Date   Anemia    Ectopic pregnancy    Essential hypertension    Leiomyoma of uterus    Pneumonia    Shortness of breath dyspnea     Past Surgical History:  Procedure Laterality Date   CESAREAN SECTION     CYSTOSCOPY N/A 10/21/2015   Procedure: CYSTOSCOPY;  Surgeon: Terrance Mass, MD;  Location: [facility];  Service: Gynecology;  Laterality: N/A;   LAPAROSCOPIC VAGINAL HYSTERECTOMY WITH SALPINGO OOPHORECTOMY N/A 10/21/2015   Procedure: LAPAROSCOPIC ASSISTED VAGINAL HYSTERECTOMY;  Surgeon: Terrance Mass, MD;  Location: [facility];  Service: Gynecology;  Laterality: N/A;   OOPHORECTOMY     SALPINGOOPHORECTOMY Left 10/21/2015   Procedure: SALPINGO OOPHORECTOMY;  Surgeon: Terrance Mass, MD;  Location: [facility];  Service: Gynecology;  Laterality: Left;   TUBAL LIGATION      MEDICATIONS:  0.9 %  sodium chloride infusion    albuterol (VENTOLIN HFA) 108 (90 Base) MCG/ACT inhaler   benzonatate (TESSALON) 200 MG capsule   fluticasone (FLONASE) 50 MCG/ACT nasal spray   fluticasone-salmeterol (WIXELA INHUB) 100-50 MCG/ACT AEPB   hydrochlorothiazide (HYDRODIURIL) 25 MG tablet   lisinopril (ZESTRIL) 10 MG tablet   montelukast (SINGULAIR) 10 MG tablet   amLODipine  (NORVASC) 5 MG tablet   diclofenac (VOLTAREN) 75 MG EC tablet   estradiol (ESTRACE) 1 MG tablet   hydrOXYzine (VISTARIL) 100 MG capsule   pantoprazole (PROTONIX) 40 MG tablet    Myra Gianotti, PA-C Surgical Short Stay/Anesthesiology [facility] Phone [phone] [facility] Phone [phone] 08/31/2022 11:47 AM

## 2022-08-31 NOTE — Progress Notes (Signed)
I spoke with Stephanie Gibson, using [place] interpreter, [place] ID # [id number], patient denies chest pain or shortness of breath. [place]  Patient denies having any s/s of Covid in her household, also denies any known exposure to Covid.   Leathia's PCP is  Dr. Shari Heritage.

## 2022-08-31 NOTE — Anesthesia Preprocedure Evaluation (Signed)
Anesthesia Evaluation  Patient identified by MRN, date of birth, ID band Patient awake    Reviewed: Allergy & Precautions, NPO status , Patient's Chart, lab work & pertinent test results  History of Anesthesia Complications Negative for: history of anesthetic complications  Airway Mallampati: II  TM Distance: >3 FB Neck ROM: Full    Dental no notable dental hx.    Pulmonary asthma ,  Hemoptysis    Pulmonary exam normal        Cardiovascular hypertension, Pt. on medications Normal cardiovascular exam     Neuro/Psych Anxiety    GI/Hepatic   Endo/Other    Renal/GU      Musculoskeletal  (+) Arthritis ,   Abdominal   Peds  Hematology   Anesthesia Other Findings   Reproductive/Obstetrics                            Anesthesia Physical Anesthesia Plan  ASA: 2  Anesthesia Plan: General   Post-op Pain Management: Minimal or no pain anticipated   Induction: Intravenous  PONV Risk Score and Plan: 3 and Treatment may vary due to age or medical condition, Ondansetron, Dexamethasone and Midazolam  Airway Management Planned: Oral ETT  Additional Equipment:   Intra-op Plan:   Post-operative Plan: Extubation in OR  Informed Consent: I have reviewed the patients History and Physical, chart, labs and discussed the procedure including the risks, benefits and alternatives for the proposed anesthesia with the patient or authorized representative who has indicated his/her understanding and acceptance.     Interpreter used for [employer] and Engineer, production given  Plan Discussed with: CRNA  Anesthesia Plan Comments: (PAT note written 08/31/2022 by Myra Gianotti, PA-C. )       Anesthesia Quick Evaluation

## 2022-09-01 ENCOUNTER — Encounter (HOSPITAL_COMMUNITY): Payer: Self-pay | Admitting: Pulmonary Disease

## 2022-09-01 ENCOUNTER — Ambulatory Visit (HOSPITAL_BASED_OUTPATIENT_CLINIC_OR_DEPARTMENT_OTHER): Payer: Commercial Managed Care - HMO | Admitting: Vascular Surgery

## 2022-09-01 ENCOUNTER — Ambulatory Visit (HOSPITAL_COMMUNITY)
Admission: RE | Admit: 2022-09-01 | Discharge: 2022-09-01 | Disposition: A | Discharge status: Home / Self Care | Payer: Commercial Managed Care - HMO | Source: Ambulatory Visit | Attending: Pulmonary Disease | Admitting: Pulmonary Disease

## 2022-09-01 ENCOUNTER — Ambulatory Visit (HOSPITAL_COMMUNITY): Payer: Commercial Managed Care - HMO | Admitting: Vascular Surgery

## 2022-09-01 ENCOUNTER — Encounter (HOSPITAL_COMMUNITY)
Admission: RE | Disposition: A | Discharge status: Home / Self Care | Payer: Self-pay | Source: Ambulatory Visit | Attending: Pulmonary Disease

## 2022-09-01 DIAGNOSIS — I10 Essential (primary) hypertension: Secondary | ICD-10-CM | POA: Insufficient documentation

## 2022-09-01 DIAGNOSIS — Z1152 Encounter for screening for COVID-19: Secondary | ICD-10-CM | POA: Diagnosis not present

## 2022-09-01 DIAGNOSIS — A492 Hemophilus influenzae infection, unspecified site: Secondary | ICD-10-CM

## 2022-09-01 DIAGNOSIS — R042 Hemoptysis: Secondary | ICD-10-CM

## 2022-09-01 DIAGNOSIS — J45909 Unspecified asthma, uncomplicated: Secondary | ICD-10-CM

## 2022-09-01 DIAGNOSIS — F419 Anxiety disorder, unspecified: Secondary | ICD-10-CM | POA: Insufficient documentation

## 2022-09-01 DIAGNOSIS — R0982 Postnasal drip: Secondary | ICD-10-CM

## 2022-09-01 HISTORY — DX: Unspecified osteoarthritis, unspecified site: M19.90

## 2022-09-01 HISTORY — DX: Anxiety disorder, unspecified: F41.9

## 2022-09-01 HISTORY — PX: VIDEO BRONCHOSCOPY: SHX5072

## 2022-09-01 HISTORY — PX: BRONCHIAL WASHINGS: SHX5105

## 2022-09-01 LAB — SARS CORONAVIRUS 2 BY RT PCR: SARS Coronavirus 2 by RT PCR: NEGATIVE

## 2022-09-01 SURGERY — VIDEO BRONCHOSCOPY WITHOUT FLUORO
Anesthesia: General

## 2022-09-01 MED ORDER — SUCCINYLCHOLINE CHLORIDE 200 MG/10ML IV SOSY
PREFILLED_SYRINGE | INTRAVENOUS | Status: DC | PRN
  Administered 2022-09-01: 120 mg via INTRAVENOUS

## 2022-09-01 MED ORDER — CHLORHEXIDINE GLUCONATE 0.12 % MT SOLN
15.0000 mL | OROMUCOSAL | Status: AC
  Filled 2022-09-01: qty 15

## 2022-09-01 MED ORDER — CHLORHEXIDINE GLUCONATE 0.12 % MT SOLN
OROMUCOSAL | Status: AC
  Administered 2022-09-01: 15 mL via OROMUCOSAL
  Filled 2022-09-01: qty 15

## 2022-09-01 MED ORDER — DEXAMETHASONE SODIUM PHOSPHATE 10 MG/ML IJ SOLN
INTRAMUSCULAR | Status: DC | PRN
  Administered 2022-09-01: 8 mg via INTRAVENOUS

## 2022-09-01 MED ORDER — PROPOFOL 10 MG/ML IV BOLUS
INTRAVENOUS | Status: DC | PRN
  Administered 2022-09-01: 150 mg via INTRAVENOUS
  Administered 2022-09-01 (×3): 50 mg via INTRAVENOUS

## 2022-09-01 MED ORDER — FENTANYL CITRATE (PF) 100 MCG/2ML IJ SOLN
INTRAMUSCULAR | Status: DC | PRN
  Administered 2022-09-01 (×2): 50 ug via INTRAVENOUS

## 2022-09-01 MED ORDER — LIDOCAINE 2% (20 MG/ML) 5 ML SYRINGE
INTRAMUSCULAR | Status: DC | PRN
  Administered 2022-09-01: 100 mg via INTRAVENOUS

## 2022-09-01 MED ORDER — LACTATED RINGERS IV SOLN: INTRAVENOUS | Status: DC

## 2022-09-01 MED ORDER — ONDANSETRON HCL 4 MG/2ML IJ SOLN
INTRAMUSCULAR | Status: DC | PRN
  Administered 2022-09-01: 4 mg via INTRAVENOUS

## 2022-09-01 NOTE — Transfer of Care (Signed)
Immediate Anesthesia Transfer of Care Note  Patient: Stephanie Gibson  Procedure(s) Performed: VIDEO BRONCHOSCOPY WITHOUT FLUORO BRONCHIAL WASHINGS  Patient Location: PACU  Anesthesia Type:General  Level of Consciousness: awake and patient cooperative  Airway & Oxygen Therapy: Patient Spontanous Breathing  Post-op Assessment: Report given to RN and Post -op Vital signs reviewed and stable  Post vital signs: Reviewed and stable  Last Vitals:  Vitals Value Taken Time  BP 181/78 09/01/22 1442  Temp    Pulse 106 09/01/22 1443  Resp 16 09/01/22 1443  SpO2 91 % 09/01/22 1443  Vitals shown include unvalidated device data.  Last Pain:  Vitals:   09/01/22 1230  TempSrc:   PainSc: 0-No pain      Patients Stated Pain Goal: 0 (76/54/65 0354)  Complications: No notable events documented.

## 2022-09-01 NOTE — Anesthesia Procedure Notes (Signed)
Procedure Name: Intubation Date/Time: 09/01/2022 2:23 PM  Performed by: Gwyndolyn Saxon, CRNAPre-anesthesia Checklist: Patient identified, Emergency Drugs available, Suction available and Patient being monitored Patient Re-evaluated:Patient Re-evaluated prior to induction Oxygen Delivery Method: Circle system utilized Preoxygenation: Pre-oxygenation with 100% oxygen Induction Type: IV induction Ventilation: Mask ventilation without difficulty Laryngoscope Size: 3 and Glidescope Grade View: Grade I Tube type: Oral Tube size: 8.0 mm Number of attempts: 2 Airway Equipment and Method: Patient positioned with wedge pillow and Rigid stylet Placement Confirmation: ETT inserted through vocal cords under direct vision, positive ETCO2 and breath sounds checked- equal and bilateral Secured at: 21 cm Tube secured with: Tape Dental Injury: Teeth and Oropharynx as per pre-operative assessment  Difficulty Due To: Difficulty was unanticipated and Difficult Airway- due to anterior larynx Comments: Very easy mask ventilation; DL with miller 2 and grade 3 view with cricoid pressure. Pt with full set of upper and lower caps. Decision made to utilize glidescope. Grade 1 view. No change in dentition, lips, or gums.

## 2022-09-01 NOTE — Op Note (Signed)
[facility] Cardiopulmonary Patient Name: Stephanie Gibson Date: 09/01/2022 MRN: [id number] Attending MD: Juanito Doom , MD,  Date of Birth: 08-17-1966 CSN: Finalized Age: 56 Admit Type: Inpatient Gender: Female Procedure:             Bronchoscopy Indications:           Hemoptysis with normal CXR, Asthma Providers:             Nathaneil Canary B. Lake Bells, MD, William Dalton, Technician,                         Benay Pillow, RN Referring MD:           Medicines:             See the Anesthesia note for documentation of the                         administered medications Complications:         No immediate complications Estimated Blood Loss:  Estimated blood loss: none. Procedure:             Pre-Anesthesia Assessment:                        - A History and Physical has been performed. Patient                         meds and allergies have been reviewed. The risks and                         benefits of the procedure and the sedation options and                         risks were discussed with the patient. All questions                         were answered and informed consent was obtained.                         Patient identification and proposed procedure were                         verified prior to the procedure by the physician and                         the nurse in the pre-procedure area. Mental Status                         Examination: normal. Airway Examination: normal                         oropharyngeal airway. Respiratory Examination: clear                         to auscultation. CV Examination: normal. ASA Grade                         Assessment: II - A patient with mild systemic disease.  After reviewing the risks and benefits, the patient                         was deemed in satisfactory condition to undergo the                         procedure. The anesthesia plan was to use deep                          sedation / analgesia. Immediately prior to                         administration of medications, the patient was                         re-assessed for adequacy to receive sedatives. The                         heart rate, respiratory rate, oxygen saturations,                         blood pressure, adequacy of pulmonary ventilation, and                         response to care were monitored throughout the                         procedure. The physical status of the patient was                         re-assessed after the procedure.                        After obtaining informed consent, the bronchoscope was                         passed under direct vision. Throughout the procedure,                         the patient's blood pressure, pulse, and oxygen                         saturations were monitored continuously. the [id number]                         ([id number]) Olympus bronchoscope was introduced through                         the mouth, via the endotracheal tube and advanced to                         the tracheobronchial tree. The procedure was                         accomplished without difficulty. The patient tolerated                         the procedure well. The total duration of the  procedure was 6 minutes. Scope In: Scope Out: Findings:      The endotracheal tube is in good position. The visualized portion of the       trachea is of normal caliber. The carina is sharp. The tracheobronchial       tree was examined to at least the first subsegmental level. Bronchial       mucosa and anatomy are normal; there are no endobronchial lesions, and       no secretions.      The bronchoscope was advanced until wedged at the desired location for       bronchoalveolar lavage. BAL was performed in the right middle lobe of       the lung and sent for cell count, bacterial culture, viral smears &       culture, and fungal & AFB analysis. 40 mL of fluid  were instilled. 16 mL       were returned. There were no mucoid plugs in the return fluid. Impression:            - Hemoptysis with normal CXR                        - Asthma                        - The airway examination was normal.                        - Bronchoalveolar lavage was performed. Moderate Sedation:      General Anesthesia Recommendation:        - Await BAL and culture results. Procedure Code(s):     --- Professional ---                        [phone], Bronchoscopy, rigid or flexible, including                         fluoroscopic guidance, when performed; with bronchial                         alveolar lavage Diagnosis Code(s):     --- Professional ---                        R04.2, Hemoptysis                        J45.909, Unspecified asthma, uncomplicated CPT copyright 2022 American Medical Association. All rights reserved. The codes documented in this report are preliminary and upon coder review may  be revised to meet current compliance requirements. Norlene Campbell, MD Juanito Doom, MD 09/01/2022 2:36:43 PM This report has been signed electronically. Number of Addenda: 0

## 2022-09-01 NOTE — H&P (Signed)
[facility]  CC: Hemoptysis HPI: 56 y/o female with a history of asthma first seen in our clinic several weeks ago complaining of dyspnea, wheezing and productive cough.  Sputum culture grew haemophilus.  Has had several ER visits for dyspnea, treated with prolonged antibiotics and prednisone.  Noted some hemoptysis on several visits.  Imaging did not reveal a cause of the hemoptysis. Here today for a bronchoscopy for airway exam.   She says that she has not coughed up blood in 2 weeks.  Past Medical History:  Diagnosis Date   Anemia    Anxiety    Arthritis    Ectopic pregnancy    Essential hypertension    Leiomyoma of uterus    Pneumonia    Shortness of breath dyspnea      Family History  Problem Relation Age of Onset   Hypertension Mother    Arthritis Mother    Cancer Father        THROAT    Hypertension Sister    Hypertension Brother    Colon cancer Neg Hx      Social History   Socioeconomic History   Marital status: Married    Spouse name: Not on file   Number of children: Not on file   Years of education: Not on file   Highest education level: Not on file  Occupational History   Not on file  Tobacco Use   Smoking status: Never   Smokeless tobacco: Never  Vaping Use   Vaping Use: Never used  Substance and Sexual Activity   Alcohol use: Not Currently    Comment: occ   Drug use: No   Sexual activity: Yes    Birth control/protection: None    Comment: INTERCOURSE AGE 5, SEXUAL PARTNERS LESS THAN 5, current partner-  22 yrs   Other Topics Concern   Not on file  Social History Narrative   Not on file   Social Determinants of Health   Financial Resource Strain: Not on file  Food Insecurity: Not on file  Transportation Needs: Not on file  Physical Activity: Not on file  Stress: Not on file  Social Connections: Not on file  Intimate Partner Violence: Not on file     No Known Allergies   _0 @ Vitals:   08/31/22 1754 09/01/22 1157  BP:  (!)  150/84  Pulse:  77  Resp:  18  Temp:  98.3 F (36.8 C)  TempSrc:  Oral  SpO2:  100%  Weight: 82.1 kg 82.1 kg  Height: _1  (1.575 m) _2  (1.575 m)   General:  Resting comfortably in bed HENT: NCAT OP clear PULM: CTA B, normal effort CV: RRR, no mgr GI: BS+, soft, nontender MSK: normal bulk and tone Neuro: awake, alert, no distress, MAEW  10/1 CT angiogram chest personally reviewed, no infiltrate, no PE  Impression: Hemoptysis Cough Haemophilus lung infection Asthma  Plan: Bronchoscopy for airway exam, BAL  Discussed risks and benefits, she is willing to proceed  Roselie Awkward, MD [place] [facility] Pager: [phone] Cell: [phone] After 7:00 pm call Elink  [phone]

## 2022-09-01 NOTE — Anesthesia Postprocedure Evaluation (Signed)
Anesthesia Post Note  Patient: Stephanie Gibson  Procedure(s) Performed: VIDEO BRONCHOSCOPY WITHOUT FLUORO BRONCHIAL WASHINGS     Patient location during evaluation: PACU Anesthesia Type: General Level of consciousness: awake and alert Pain management: pain level controlled Vital Signs Assessment: post-procedure vital signs reviewed and stable Respiratory status: spontaneous breathing, nonlabored ventilation and respiratory function stable Cardiovascular status: blood pressure returned to baseline Postop Assessment: no apparent nausea or vomiting Anesthetic complications: yes   Encounter Notable Events  Notable Event Outcome Phase Comment  Difficult to intubate - unexpected  In Recovery Filed from anesthesia note documentation.    Last Vitals:  Vitals:   09/01/22 1500 09/01/22 1510  BP: (!) 142/86 132/74  Pulse: 94 83  Resp: 16 12  Temp:    SpO2: 95% 97%    Last Pain:  Vitals:   09/01/22 1510  TempSrc:   PainSc: 0-No pain                 Marthenia Rolling

## 2022-09-03 ENCOUNTER — Ambulatory Visit: Payer: Commercial Managed Care - HMO | Admitting: Nurse Practitioner

## 2022-09-03 ENCOUNTER — Encounter (HOSPITAL_COMMUNITY): Payer: Self-pay | Admitting: Pulmonary Disease

## 2022-09-03 LAB — ACID FAST SMEAR (AFB, MYCOBACTERIA): Acid Fast Smear: NEGATIVE

## 2022-09-06 LAB — AEROBIC/ANAEROBIC CULTURE W GRAM STAIN (SURGICAL/DEEP WOUND): Culture: NORMAL

## 2022-09-10 ENCOUNTER — Encounter: Payer: Self-pay | Admitting: Primary Care

## 2022-09-10 ENCOUNTER — Ambulatory Visit: Payer: Commercial Managed Care - HMO | Admitting: Primary Care

## 2022-09-10 VITALS — BP 130/76 | HR 76 | Temp 98.2°F | Ht 62.0 in | Wt 189.0 lb

## 2022-09-10 DIAGNOSIS — R0683 Snoring: Secondary | ICD-10-CM | POA: Insufficient documentation

## 2022-09-10 DIAGNOSIS — J4520 Mild intermittent asthma, uncomplicated: Secondary | ICD-10-CM | POA: Diagnosis not present

## 2022-09-10 DIAGNOSIS — R0982 Postnasal drip: Secondary | ICD-10-CM

## 2022-09-10 DIAGNOSIS — R042 Hemoptysis: Secondary | ICD-10-CM

## 2022-09-10 DIAGNOSIS — J201 Acute bronchitis due to Hemophilus influenzae: Secondary | ICD-10-CM | POA: Diagnosis not present

## 2022-09-10 DIAGNOSIS — J329 Chronic sinusitis, unspecified: Secondary | ICD-10-CM

## 2022-09-10 MED ORDER — DM-GUAIFENESIN ER 30-600 MG PO TB12: 2.0000 | ORAL_TABLET | Freq: Two times a day (BID) | ORAL | 0 refills | Status: DC

## 2022-09-10 MED ORDER — IPRATROPIUM BROMIDE 0.06 % NA SOLN: 2.0000 | Freq: Four times a day (QID) | NASAL | 12 refills | Status: AC

## 2022-09-10 NOTE — Assessment & Plan Note (Signed)
-   Patient originally presented to pulmonary office with complaints of dyspnea, wheezing productive cough.  She had 3 episodes of hemoptysis.  Underwent bronchoscopy on 09/01/2022 with Dr. Lake Bells with normal airway exam. Respiratory culture grew out normal respiratory flora. AFB and fungal cultures were negative. She has had no further episodes of hemoptysis.

## 2022-09-10 NOTE — Assessment & Plan Note (Addendum)
-   Patient has clinical symptoms consistent with asthma - Continue Wixela and Singulair  - Pulmonary function testing is scheduled for November 11th

## 2022-09-10 NOTE — Assessment & Plan Note (Signed)
-   Patient has chronic chest congestion, symptoms felt to be attributed to PND

## 2022-09-10 NOTE — Progress Notes (Signed)
_0  ID: Stephanie Gibson, female    DOB: 1966-02-20, 56 y.o.   MRN: [id number]  Chief Complaint  Patient presents with   Follow-up    Asthma flares with cold weather.  Cough with phlem in throat    Referring provider: Juanito Doom, MD  HPI: 56 year old female, never smoked.  Past medical history significant for hypertension, mild intermittent asthma, hemoptysis, postnasal drip, seasonal allergies.  Patient Dr. Lake Bells, seen by pulmonary nurse practitioner on 08/13/2022.  09/10/2022 Patient presents today for post bronchoscopy follow-up. Accompanied by interpreter. History significant for asthma hemoptysis.   She was originally seen in pulmonary office for complaints of dyspnea, wheezing and productive cough.  Past sputum culture grew out H. Influenzae.  Several ER visits for dyspnea, treated with prolonged antibiotics and prednisone.  She was noted to have hemoptysis on several visits.  Imaging did not reveal cause of hemoptysis.  Patient underwent bronchoscopy on 09/01/22 with Dr. Lake Bells. Airway exam was normal.  Bronchoalveolar lavage was performed right middle lobe.   Respiratory culture grew out normal respiratory flora, negative for Staph aureus or Pseudomonas seen. AFB and Fungal cultures are negative   She continues to complain of chest congestion. She has feeling that she needs to clear her throat. Occasional wheezing at night. Denies active cough. No further episodes of hemoptysis. She has been ordered for pulmonary function testing which have been scheduled for November 17th. She is also awaiting sleep study to be completed.   Imaging:  CTA>> Lungs/Pleura: Evaluation of the lungs limited due to respiratory motion. Minimal bibasilar atelectasis. No focal airspace opacity to indicate pneumonia. 4 mm nodule in the right middle lobe (67/11) is unchanged since 08/11/2015 which indicates a benign etiology.  No Known Allergies  Immunization History  Administered  Date(s) Administered   Influenza Split 10/09/2015, 10/15/2017   Influenza,inj,Quad PF,6+ Mos 10/09/2015, 10/15/2017, 01/16/2022   Influenza-Unspecified 08/20/2022   PFIZER(Purple Top)SARS-COV-2 Vaccination 01/19/2020, 02/11/2020    Past Medical History:  Diagnosis Date   Anemia    Anxiety    Arthritis    Ectopic pregnancy    Essential hypertension    Leiomyoma of uterus    Pneumonia    Shortness of breath dyspnea     Tobacco History: Social History   Tobacco Use  Smoking Status Never  Smokeless Tobacco Never   Counseling given: Not Answered   Outpatient Medications Prior to Visit  Medication Sig Dispense Refill   albuterol (VENTOLIN HFA) 108 (90 Base) MCG/ACT inhaler Inhale 1-2 puffs into the lungs every 6 (six) hours as needed.     amLODipine (NORVASC) 5 MG tablet Take 1 tablet (5 mg total) by mouth daily. 90 tablet 3   benzonatate (TESSALON) 200 MG capsule Take 1 capsule (200 mg total) by mouth 3 (three) times daily as needed for cough. 30 capsule 1   diclofenac (VOLTAREN) 75 MG EC tablet Take 1 tablet (75 mg total) by mouth 2 (two) times daily. 30 tablet 0   estradiol (ESTRACE) 1 MG tablet Take 1 tablet (1 mg total) by mouth daily. 90 tablet 3   fluticasone (FLONASE) 50 MCG/ACT nasal spray Place 2 sprays into both nostrils daily. 18.2 mL 2   fluticasone-salmeterol (WIXELA INHUB) 100-50 MCG/ACT AEPB Inhale 1 puff into the lungs 2 (two) times daily. 60 each 5   hydrochlorothiazide (HYDRODIURIL) 25 MG tablet Take 1 tablet (25 mg total) by mouth daily. 90 tablet 3   hydrOXYzine (VISTARIL) 100 MG capsule Take 1 capsule (100 mg total) by  mouth at bedtime. 30 capsule 1   lisinopril (ZESTRIL) 10 MG tablet Take 1 tablet by mouth every morning.     montelukast (SINGULAIR) 10 MG tablet Take 10 mg by mouth daily.     pantoprazole (PROTONIX) 40 MG tablet Take 1 tablet (40 mg total) by mouth daily. 30 tablet 1   Facility-Administered Medications Prior to Visit  Medication Dose Route  Frequency Provider Last Rate Last Admin   0.9 %  sodium chloride infusion  500 mL Intravenous Once Nandigam, Venia Minks, MD        Review of Systems  Review of Systems  Constitutional: Negative.   HENT:  Positive for congestion and postnasal drip.   Respiratory:  Positive for wheezing. Negative for apnea, cough, chest tightness and shortness of breath.   Cardiovascular: Negative.     Physical Exam  BP 130/76 (BP Location: Left Arm, Patient Position: Sitting, Cuff Size: Normal)   Pulse 76   Temp 98.2 F (36.8 C) (Oral)   Ht _0  (1.575 m)   Wt 189 lb (85.7 kg)   LMP 08/10/2015 (Approximate) Comment: continuous bleeding/spotting for months  SpO2 97%   BMI 34.57 kg/m  Physical Exam Constitutional:      Appearance: Normal appearance.  HENT:     Head: Normocephalic and atraumatic.     Mouth/Throat:     Mouth: Mucous membranes are moist.     Pharynx: Oropharynx is clear.     Comments: Normal exam Cardiovascular:     Rate and Rhythm: Normal rate and regular rhythm.  Pulmonary:     Effort: Pulmonary effort is normal.     Breath sounds: Normal breath sounds. No wheezing, rhonchi or rales.     Comments: CTA Musculoskeletal:        General: Normal range of motion.  Skin:    General: Skin is warm and dry.  Neurological:     General: No focal deficit present.     Mental Status: She is alert and oriented to person, place, and time. Mental status is at baseline.  Psychiatric:        Mood and Affect: Mood normal.        Behavior: Behavior normal.        Thought Content: Thought content normal.        Judgment: Judgment normal.      Lab Results:  CBC    Component Value Date/Time   WBC 8.3 08/08/2022 1617   RBC 5.00 08/08/2022 1617   HGB 14.2 08/08/2022 1617   HCT 43.1 08/08/2022 1617   PLT 188 08/08/2022 1617   MCV 86.2 08/08/2022 1617   MCH 28.4 08/08/2022 1617   MCHC 32.9 08/08/2022 1617   RDW 14.4 08/08/2022 1617   LYMPHSABS 2.2 08/08/2022 1617   MONOABS 0.6  08/08/2022 1617   EOSABS 0.0 08/08/2022 1617   BASOSABS 0.1 08/08/2022 1617    BMET    Component Value Date/Time   NA 140 08/08/2022 1617   NA 140 03/25/2020 1027   K 3.3 (L) 08/08/2022 1617   CL 106 08/08/2022 1617   CO2 24 08/08/2022 1617   GLUCOSE 117 (H) 08/08/2022 1617   BUN 19 08/08/2022 1617   BUN 14 03/25/2020 1027   CREATININE 0.82 08/08/2022 1617   CREATININE 0.64 06/25/2016 0826   CALCIUM 9.3 08/08/2022 1617   GFRNONAA >60 08/08/2022 1617   GFRAA 114 03/25/2020 1027    BNP No results found for: "BNP"  ProBNP No results found for: "PROBNP"  Imaging: No results found.   Assessment & Plan:   Hemoptysis - Patient originally presented to pulmonary office with complaints of dyspnea, wheezing productive cough.  She had 3 episodes of hemoptysis.  Underwent bronchoscopy on 09/01/2022 with Dr. Lake Bells with normal airway exam. Respiratory culture grew out normal respiratory flora. AFB and fungal cultures were negative. She has had no further episodes of hemoptysis.    Acute bronchitis - Patient has chronic chest congestion, symptoms felt to be attributed to PND  Mild intermittent asthma without complication - Patient has clinical symptoms consistent with asthma - Continue Wixela and Singulair  - Pulmonary function testing is scheduled for November 11th   Postnasal drip - Recommend patient start Atrovent nasal spray QID and mucinex-dm 1252m BID  - Refer to ENT for chronic sinusitis, needs upper airway evaluation d/t hemoptysis   Snoring - Ordered for split night sleep study, this still needs to be scheduled    EMartyn Ehrich NP 09/10/2022

## 2022-09-10 NOTE — Patient Instructions (Addendum)
  Bronchoscopy was normal, there is not infection in your lungs or evidence of lung cancer   You have breathing test coming up and we will call you to schedule sleep study hopefully in the next 2-4 weeks   Recommendations: Start Atorvent nasal spray four times a day Take Mucinex-dm '1200mg'$  morning and evening Continue Singulair '10mg'$  at bedtime   Refer: ENT re: sinusitis/hemoptysis   Follow-up: Pulmonary function testing is scheduled for November 17th at 11am   Needs follow-up with Dr. Waunita Schooner in 4-8 weeks    ________________________________________________________________________________________________________  Stephanie Gibson broncoscopia fue normal, no hay infeccin en los pulmones ni evidencia de cncer de pulmn   Se acerca una prueba de respiracin y lo llamaremos para programar el estudio del sueo, con suerte, en las prximas 2 a 4 semanas   Recomendaciones: Comience a Event organiser aerosol nasal Atorvent cuatro veces al da Tomar Mucinex-dm '1200mg'$  por la maana y por la noche Continuar con Singulair 10 mg a la hora de acostarse   Recomienda: Otorrinolaringologa re: sinusitis/hemoptisis   Seguimiento: Las pruebas de funcin pulmonar estn programadas para el 51 de noviembre a las 11 a.m.   Necesita seguimiento con el Dr. Waunita Schooner en 4-8 w

## 2022-09-10 NOTE — Assessment & Plan Note (Signed)
-   Ordered for split night sleep study, this still needs to be scheduled

## 2022-09-10 NOTE — Assessment & Plan Note (Addendum)
-   Recommend patient start Atrovent nasal spray QID and mucinex-dm '1200mg'$  BID  - Refer to ENT for chronic sinusitis, needs upper airway evaluation d/t hemoptysis

## 2022-09-13 NOTE — Progress Notes (Signed)
Reviewed, agree 

## 2022-09-22 LAB — CULTURE, FUNGUS WITHOUT SMEAR

## 2022-09-24 ENCOUNTER — Ambulatory Visit (INDEPENDENT_AMBULATORY_CARE_PROVIDER_SITE_OTHER): Payer: Commercial Managed Care - HMO | Admitting: Pulmonary Disease

## 2022-09-24 DIAGNOSIS — J45909 Unspecified asthma, uncomplicated: Secondary | ICD-10-CM | POA: Diagnosis not present

## 2022-09-24 LAB — PULMONARY FUNCTION TEST
DL/VA % pred: 110 %
DL/VA: 4.7 ml/min/mmHg/L
DLCO cor % pred: 101 %
DLCO cor: 20.38 ml/min/mmHg
DLCO unc % pred: 103 %
DLCO unc: 20.86 ml/min/mmHg
FEF 25-75 Post: 2.56 L/sec
FEF 25-75 Pre: 1.14 L/sec
FEF2575-%Change-Post: 124 %
FEF2575-%Pred-Post: 104 %
FEF2575-%Pred-Pre: 46 %
FEV1-%Change-Post: 72 %
FEV1-%Pred-Post: 98 %
FEV1-%Pred-Pre: 57 %
FEV1-Post: 2.56 L
FEV1-Pre: 1.48 L
FEV1FVC-%Change-Post: 80 %
FEV1FVC-%Pred-Pre: 60 %
FEV6-%Change-Post: -4 %
FEV6-%Pred-Post: 92 %
FEV6-%Pred-Pre: 97 %
FEV6-Post: 2.98 L
FEV6-Pre: 3.12 L
FEV6FVC-%Pred-Post: 103 %
FEV6FVC-%Pred-Pre: 103 %
FVC-%Change-Post: -4 %
FVC-%Pred-Post: 89 %
FVC-%Pred-Pre: 94 %
FVC-Post: 2.98 L
FVC-Pre: 3.12 L
Post FEV1/FVC ratio: 86 %
Post FEV6/FVC ratio: 100 %
Pre FEV1/FVC ratio: 47 %
Pre FEV6/FVC Ratio: 100 %
RV % pred: 97 %
RV: 1.86 L
TLC % pred: 98 %
TLC: 4.93 L

## 2022-09-24 NOTE — Patient Instructions (Signed)
Full PFT Performed Today  

## 2022-09-24 NOTE — Progress Notes (Signed)
Full PFT Performed Today  

## 2022-10-13 ENCOUNTER — Ambulatory Visit: Payer: Commercial Managed Care - HMO | Admitting: Pulmonary Disease

## 2022-10-17 LAB — ACID FAST CULTURE WITH REFLEXED SENSITIVITIES (MYCOBACTERIA): Acid Fast Culture: NEGATIVE

## 2022-11-12 ENCOUNTER — Encounter: Payer: Self-pay | Admitting: Pulmonary Disease

## 2022-11-12 ENCOUNTER — Ambulatory Visit: Payer: Commercial Managed Care - HMO | Admitting: Pulmonary Disease

## 2022-11-12 ENCOUNTER — Other Ambulatory Visit: Payer: Self-pay

## 2022-11-12 VITALS — BP 128/72 | HR 84 | Temp 98.6°F | Ht 62.0 in | Wt 187.6 lb

## 2022-11-12 DIAGNOSIS — J329 Chronic sinusitis, unspecified: Secondary | ICD-10-CM | POA: Diagnosis not present

## 2022-11-12 DIAGNOSIS — J309 Allergic rhinitis, unspecified: Secondary | ICD-10-CM | POA: Diagnosis not present

## 2022-11-12 DIAGNOSIS — J4540 Moderate persistent asthma, uncomplicated: Secondary | ICD-10-CM | POA: Diagnosis not present

## 2022-11-12 DIAGNOSIS — J4520 Mild intermittent asthma, uncomplicated: Secondary | ICD-10-CM

## 2022-11-12 MED ORDER — OSELTAMIVIR PHOSPHATE 75 MG PO CAPS: 75.0000 mg | ORAL_CAPSULE | Freq: Two times a day (BID) | ORAL | 0 refills | Status: DC

## 2022-11-12 MED ORDER — ALBUTEROL SULFATE HFA 108 (90 BASE) MCG/ACT IN AERS: 1.0000 | INHALATION_SPRAY | Freq: Four times a day (QID) | RESPIRATORY_TRACT | 3 refills | Status: AC | PRN

## 2022-11-12 MED ORDER — FLUTICASONE-SALMETEROL 100-50 MCG/ACT IN AEPB: 1.0000 | INHALATION_SPRAY | Freq: Two times a day (BID) | RESPIRATORY_TRACT | 5 refills | Status: DC

## 2022-11-12 NOTE — Patient Instructions (Signed)
Poorly controlled asthma, moderate persistent Start taking the Wixela twice a day no matter how you feel Use albuterol as needed for chest tightness wheezing or shortness of breath Practice good hand hygiene Stay physically active  Cold symptoms: We do not have flu swabs today to confirm the diagnosis but I am suspicious of influenza given its high activity in our community I will prescribe Tamiflu which you can pick up if you have fever or worsening symptoms  Allergic rhinitis: Keep taking fluticasone nose spray twice daily no matter how you feel  We will see you back in 6 months or sooner if needed

## 2022-11-12 NOTE — Progress Notes (Signed)
Synopsis: Referred in September 2023 for asthma.  Had [place] in 2020.  Subjective:   PATIENT ID: Stephanie Gibson GENDER: female DOB: [birth year], MRN: [id number]   HPI  Chief Complaint  Patient presents with   Follow-up    No new issues since LOV.     Since the last visit she has been doing fairly well until last night.  She says that taking the nasal spray has improved the amount of mucus production she has had.  However, it sounds as if she is not using Wixela and she still using albuterol anywhere between 3 and 10 times a week.  We took extensive history with the interpreter and it is difficult to know how frequently she is using it but it sounds as if albuterol use is somewhere in this range.  No recent fevers or chills.  However last night she had the onset of bodyaches, sinus congestion and sore throat with worsening cough.  Multiple sick contacts.  No one has been to the doctor.  Past Medical History:  Diagnosis Date   Anemia    Anxiety    Arthritis    Ectopic pregnancy    Essential hypertension    Leiomyoma of uterus    Pneumonia    Shortness of breath dyspnea      Review of Systems  Constitutional:  Positive for fever and malaise/fatigue. Negative for chills and weight loss.  HENT:  Positive for congestion and sore throat. Negative for sinus pain.   Respiratory:  Positive for cough. Negative for sputum production and shortness of breath.   Cardiovascular:  Negative for chest pain and leg swelling.       Objective:  Physical Exam   Vitals:   11/12/22 1621  BP: 128/72  Pulse: 84  Temp: 98.6 F (37 C)  TempSrc: Oral  SpO2: 94%  Weight: 187 lb 9.6 oz (85.1 kg)  Height: '5\' 2"'$  (1.575 m)    RA  Gen: well appearing HENT: OP clear, neck supple PULM: CTA B, normal effort  CV: RRR, no mgr GI: BS+, soft, nontender Derm: no cyanosis or rash Psyche: normal mood and affect    CBC    Component Value Date/Time   WBC 8.3 08/08/2022 1617   RBC 5.00  08/08/2022 1617   HGB 14.2 08/08/2022 1617   HCT 43.1 08/08/2022 1617   PLT 188 08/08/2022 1617   MCV 86.2 08/08/2022 1617   MCH 28.4 08/08/2022 1617   MCHC 32.9 08/08/2022 1617   RDW 14.4 08/08/2022 1617   LYMPHSABS 2.2 08/08/2022 1617   MONOABS 0.6 08/08/2022 1617   EOSABS 0.0 08/08/2022 1617   BASOSABS 0.1 08/08/2022 1617     Chest imaging: December 2020 chest x-ray independently reviewed showing normal pulmonary parenchyma, no pulmonary infiltrate  PFT: November 2023 pulmonary function testing FEV1 to FVC ratio 47%, FEV1 1.48 L 57% predicted, improved to 2.5 698% predicted with bronchodilator, total lung capacity 4.93 L 98% predicted, DLCO 20.86 103% predicted question whether or not prebronchodilator effort was adequate  Labs:  Path:  Echo:  Heart Catheterization:       Assessment & Plan:   Sinusitis, unspecified chronicity, unspecified location  Moderate persistent asthma without complication  Allergic rhinitis, unspecified seasonality, unspecified trigger  Discussion: She has poorly controlled asthma mostly due to non-compliance with Wixella.  We spent a long time with the interpreter today going over the difference between controller medicines and rescue inhalers.  I explained that her postnasal drip makes asthma worse  so she needs to continue taking nasal steroids and she also needs to take the Wixela regularly to improve asthma symptoms.  There is no barriers to her taking the medicine, there was a lack of understanding what hopefully we cleared up with questions and answers today.  Plan: Poorly controlled asthma, moderate persistent Start taking the Wixela twice a day no matter how you feel Use albuterol as needed for chest tightness wheezing or shortness of breath Practice good hand hygiene Stay physically active  Cold symptoms: We do not have flu swabs today to confirm the diagnosis but I am suspicious of influenza given its high activity in our  community I will prescribe Tamiflu which you can pick up if you have fever or worsening symptoms  Allergic rhinitis: Keep taking fluticasone nose spray twice daily no matter how you feel  We will see you back in 6 months or sooner if needed   Immunizations: Immunization History  Administered Date(s) Administered   Influenza Split 10/09/2015, 10/15/2017   Influenza,inj,Quad PF,6+ Mos 10/09/2015, 10/15/2017, 01/16/2022   Influenza-Unspecified 08/20/2022   PFIZER(Purple Top)SARS-COV-2 Vaccination 01/19/2020, 02/11/2020     Current Outpatient Medications:    amLODipine (NORVASC) 5 MG tablet, Take 1 tablet (5 mg total) by mouth daily., Disp: 90 tablet, Rfl: 3   benzonatate (TESSALON) 200 MG capsule, Take 1 capsule (200 mg total) by mouth 3 (three) times daily as needed for cough., Disp: 30 capsule, Rfl: 1   dextromethorphan-guaiFENesin (MUCINEX DM) 30-600 MG 12hr tablet, Take 2 tablets by mouth 2 (two) times daily., Disp: 60 tablet, Rfl: 0   diclofenac (VOLTAREN) 75 MG EC tablet, Take 1 tablet (75 mg total) by mouth 2 (two) times daily., Disp: 30 tablet, Rfl: 0   estradiol (ESTRACE) 1 MG tablet, Take 1 tablet (1 mg total) by mouth daily., Disp: 90 tablet, Rfl: 3   fluticasone (FLONASE) 50 MCG/ACT nasal spray, Place 2 sprays into both nostrils daily., Disp: 18.2 mL, Rfl: 2   hydrochlorothiazide (HYDRODIURIL) 25 MG tablet, Take 1 tablet (25 mg total) by mouth daily., Disp: 90 tablet, Rfl: 3   hydrOXYzine (VISTARIL) 100 MG capsule, Take 1 capsule (100 mg total) by mouth at bedtime., Disp: 30 capsule, Rfl: 1   ipratropium (ATROVENT) 0.06 % nasal spray, Place 2 sprays into both nostrils 4 (four) times daily., Disp: 15 mL, Rfl: 12   lisinopril (ZESTRIL) 10 MG tablet, Take 1 tablet by mouth every morning., Disp: , Rfl:    montelukast (SINGULAIR) 10 MG tablet, Take 10 mg by mouth daily., Disp: , Rfl:    oseltamivir (TAMIFLU) 75 MG capsule, Take 1 capsule (75 mg total) by mouth 2 (two) times daily.,  Disp: 10 capsule, Rfl: 0   pantoprazole (PROTONIX) 40 MG tablet, Take 1 tablet (40 mg total) by mouth daily., Disp: 30 tablet, Rfl: 1   albuterol (VENTOLIN HFA) 108 (90 Base) MCG/ACT inhaler, Inhale 1-2 puffs into the lungs every 6 (six) hours as needed., Disp: 18 g, Rfl: 3   fluticasone-salmeterol (WIXELA INHUB) 100-50 MCG/ACT AEPB, Inhale 1 puff into the lungs 2 (two) times daily., Disp: 60 each, Rfl: 5  Current Facility-Administered Medications:    0.9 %  sodium chloride infusion, 500 mL, Intravenous, Once, Nandigam, Venia Minks, MD

## 2023-03-09 ENCOUNTER — Encounter: Payer: Self-pay | Admitting: Gastroenterology

## 2023-10-18 ENCOUNTER — Other Ambulatory Visit (HOSPITAL_BASED_OUTPATIENT_CLINIC_OR_DEPARTMENT_OTHER): Payer: Self-pay

## 2023-10-18 DIAGNOSIS — R0683 Snoring: Secondary | ICD-10-CM

## 2023-10-18 DIAGNOSIS — R454 Irritability and anger: Secondary | ICD-10-CM

## 2023-10-18 DIAGNOSIS — R5383 Other fatigue: Secondary | ICD-10-CM

## 2023-10-18 DIAGNOSIS — G4700 Insomnia, unspecified: Secondary | ICD-10-CM

## 2023-11-09 ENCOUNTER — Encounter (HOSPITAL_COMMUNITY): Payer: Self-pay

## 2023-11-09 ENCOUNTER — Ambulatory Visit (HOSPITAL_COMMUNITY)
Admission: EM | Admit: 2023-11-09 | Discharge: 2023-11-09 | Disposition: A | Discharge status: Home / Self Care | Payer: Commercial Managed Care - HMO | Attending: Emergency Medicine | Admitting: Emergency Medicine

## 2023-11-09 DIAGNOSIS — S61213A Laceration without foreign body of left middle finger without damage to nail, initial encounter: Secondary | ICD-10-CM | POA: Diagnosis not present

## 2023-11-09 DIAGNOSIS — S61211A Laceration without foreign body of left index finger without damage to nail, initial encounter: Secondary | ICD-10-CM | POA: Diagnosis not present

## 2023-11-09 DIAGNOSIS — Z23 Encounter for immunization: Secondary | ICD-10-CM

## 2023-11-09 MED ORDER — TETANUS-DIPHTH-ACELL PERTUSSIS 5-2.5-18.5 LF-MCG/0.5 IM SUSY
PREFILLED_SYRINGE | INTRAMUSCULAR | Status: AC
  Filled 2023-11-09: qty 0.5

## 2023-11-09 MED ORDER — TETANUS-DIPHTH-ACELL PERTUSSIS 5-2.5-18.5 LF-MCG/0.5 IM SUSY
0.5000 mL | PREFILLED_SYRINGE | Freq: Once | INTRAMUSCULAR | Status: AC
  Administered 2023-11-09: 0.5 mL via INTRAMUSCULAR

## 2023-11-09 MED ORDER — SULFAMETHOXAZOLE-TRIMETHOPRIM 800-160 MG PO TABS: 1.0000 | ORAL_TABLET | Freq: Two times a day (BID) | ORAL | 0 refills | Status: AC

## 2023-11-09 NOTE — ED Provider Notes (Signed)
 [facility]    CSN: [id number] Arrival date & time: 11/09/23  1629    HISTORY   Chief Complaint  Patient presents with   Laceration   HPI Stephanie Gibson is a pleasant, 58 y.o. female who presents to urgent care today. Patient states that 30 minutes prior to arrival she was cutting meat in her kitchen using a brand-new knife which was very sharp.  Patient states that the knife slipped and lacerated the tip of her left middle finger.  Patient states she immediately ran warm water  over the finger and applied pressure to control the bleeding.  At this time, there is minimal bleeding.  The history is provided by the patient. A language interpreter was used.   Past Medical History:  Diagnosis Date   Anemia    Anxiety    Arthritis    Ectopic pregnancy    Essential hypertension    Leiomyoma of uterus    Pneumonia    Shortness of breath dyspnea    Patient Active Problem List   Diagnosis Date Noted   Snoring 09/10/2022   Postnasal drip 08/13/2022   Acute bronchitis 08/06/2022   Haemophilus influenzae infection 07/30/2022   Hemoptysis 07/30/2022   Bilateral hand pain 08/25/2020   Primary osteoarthritis involving multiple joints 07/28/2020   Arthritis 03/30/2019   Seasonal allergies 03/30/2019   Menopausal symptom 05/28/2017   Mild intermittent asthma without complication 05/28/2017   Intramural leiomyoma of uterus 05/03/2014   Overweight 04/12/2014   HTN (hypertension) 04/12/2014   Past Surgical History:  Procedure Laterality Date   BRONCHIAL WASHINGS  09/01/2022   Procedure: BRONCHIAL WASHINGS;  Surgeon: Alaine Vicenta NOVAK, MD;  Location: [facility] ENDOSCOPY;  Service: Cardiopulmonary;;   CESAREAN SECTION     CYSTOSCOPY N/A 10/21/2015   Procedure: PHYLLIS;  Surgeon: Curlee VEAR Guan, MD;  Location: [facility] ORS;  Service: Gynecology;  Laterality: N/A;   LAPAROSCOPIC VAGINAL HYSTERECTOMY WITH SALPINGO OOPHORECTOMY N/A 10/21/2015   Procedure: LAPAROSCOPIC ASSISTED  VAGINAL HYSTERECTOMY;  Surgeon: Curlee VEAR Guan, MD;  Location: [facility] ORS;  Service: Gynecology;  Laterality: N/A;   OOPHORECTOMY     SALPINGOOPHORECTOMY Left 10/21/2015   Procedure: SALPINGO OOPHORECTOMY;  Surgeon: Curlee VEAR Guan, MD;  Location: [facility] ORS;  Service: Gynecology;  Laterality: Left;   TUBAL LIGATION     VIDEO BRONCHOSCOPY N/A 09/01/2022   Procedure: VIDEO BRONCHOSCOPY WITHOUT FLUORO;  Surgeon: Alaine Vicenta NOVAK, MD;  Location: [facility] ENDOSCOPY;  Service: Cardiopulmonary;  Laterality: N/A;   OB History     Gravida  4   Para  2   Term      Preterm      AB  2   Living  2      SAB  1   IAB      Ectopic  1   Multiple      Live Births             Home Medications    Prior to Admission medications   Medication Sig Start Date End Date Taking? Authorizing Provider  albuterol  (VENTOLIN  HFA) 108 (90 Base) MCG/ACT inhaler Inhale 1-2 puffs into the lungs every 6 (six) hours as needed. 11/12/22   McQuaid, Douglas B, MD  amLODipine  (NORVASC ) 5 MG tablet Take 1 tablet (5 mg total) by mouth daily. 03/25/20   Purcell Emil Schanz, MD  benzonatate  (TESSALON ) 200 MG capsule Take 1 capsule (200 mg total) by mouth 3 (three) times daily as needed for cough. 08/06/22   Cobb, Comer  V, NP  dextromethorphan-guaiFENesin  (MUCINEX  DM) 30-600 MG 12hr tablet Take 2 tablets by mouth 2 (two) times daily. 09/10/22   Hope Almarie ORN, NP  diclofenac  (VOLTAREN ) 75 MG EC tablet Take 1 tablet (75 mg total) by mouth 2 (two) times daily. 03/12/21   Purcell Emil Schanz, MD  estradiol  (ESTRACE ) 1 MG tablet Take 1 tablet (1 mg total) by mouth daily. 03/25/20   Purcell Emil Schanz, MD  fluticasone  (FLONASE ) 50 MCG/ACT nasal spray Place 2 sprays into both nostrils daily. 08/13/22   Cobb, Comer GAILS, NP  fluticasone -salmeterol (WIXELA INHUB) 100-50 MCG/ACT AEPB Inhale 1 puff into the lungs 2 (two) times daily. 11/12/22   McQuaid, Douglas B, MD  hydrochlorothiazide  (HYDRODIURIL ) 25 MG tablet Take 1  tablet (25 mg total) by mouth daily. 03/25/20   Purcell Emil Schanz, MD  hydrOXYzine  (VISTARIL ) 100 MG capsule Take 1 capsule (100 mg total) by mouth at bedtime. 02/20/18   McVey, Almarie Folks, PA-C  ipratropium (ATROVENT ) 0.06 % nasal spray Place 2 sprays into both nostrils 4 (four) times daily. 09/10/22   Hope Almarie ORN, NP  lisinopril (ZESTRIL) 10 MG tablet Take 1 tablet by mouth every morning. 08/24/22   [provider]  montelukast (SINGULAIR) 10 MG tablet Take 10 mg by mouth daily.    [provider]  oseltamivir  (TAMIFLU ) 75 MG capsule Take 1 capsule (75 mg total) by mouth 2 (two) times daily. 11/12/22   McQuaid, Douglas B, MD  pantoprazole  (PROTONIX ) 40 MG tablet Take 1 tablet (40 mg total) by mouth daily. 07/28/20   Purcell Emil Schanz, MD    Family History Family History  Problem Relation Age of Onset   Hypertension Mother    Arthritis Mother    Cancer Father        THROAT    Hypertension Sister    Hypertension Brother    Colon cancer Neg Hx    Social History Social History   Tobacco Use   Smoking status: Never    Passive exposure: Never   Smokeless tobacco: Never  Vaping Use   Vaping status: Never Used  Substance Use Topics   Alcohol use: Not Currently    Comment: occ   Drug use: No   Allergies   Patient has no known allergies.  Review of Systems Review of Systems Pertinent findings revealed after performing a 14 point review of systems has been noted in the history of present illness.  Physical Exam Vital Signs BP (!) 171/82 (BP Location: Right Arm)   Pulse 81   Temp 98.1 F (36.7 C) (Oral)   Resp 16   LMP 08/10/2015 (Approximate) Comment: continuous bleeding/spotting for months  SpO2 94%   No data found.  Physical Exam Vitals and nursing note reviewed.  Constitutional:      General: She is not in acute distress.    Appearance: Normal appearance.  HENT:     Head: Normocephalic and atraumatic.  Eyes:     Pupils: Pupils  are equal, round, and reactive to light.  Cardiovascular:     Rate and Rhythm: Normal rate and regular rhythm.  Pulmonary:     Effort: Pulmonary effort is normal.     Breath sounds: Normal breath sounds.  Musculoskeletal:        General: Normal range of motion.     Cervical back: Normal range of motion and neck supple.  Skin:    General: Skin is warm and dry.     Findings: Lesion (Laceration parallel to the  nailbed tip of left middle finger) present.  Neurological:     General: No focal deficit present.     Mental Status: She is alert and oriented to person, place, and time. Mental status is at baseline.  Psychiatric:        Mood and Affect: Mood normal.        Behavior: Behavior normal.        Thought Content: Thought content normal.        Judgment: Judgment normal.     Visual Acuity Right Eye Distance:   Left Eye Distance:   Bilateral Distance:    Right Eye Near:   Left Eye Near:    Bilateral Near:     UC Couse / Diagnostics / Procedures:     Radiology No results found.  Procedures Laceration Repair  Date/Time: 11/09/2023 6:44 PM  Performed by: Joesph Shaver Scales, PA-C Authorized by: Joesph Shaver Scales, PA-C   Consent:    Consent obtained:  Verbal   Consent given by:  Patient   Risks, benefits, and alternatives were discussed: yes     Risks discussed:  Infection, need for additional repair, pain and poor wound healing   Alternatives discussed:  No treatment, delayed treatment, observation and referral Universal protocol:    Procedure explained and questions answered to patient or proxy's satisfaction: yes     Patient identity confirmed:  Verbally with patient and arm band Anesthesia:    Anesthesia method:  None Laceration details:    Location:  Finger   Finger location:  L long finger   Length (cm):  1.5   Depth (mm):  2 Exploration:    Wound extent: fascia violated     Wound extent: no foreign bodies/material noted, no muscle damage noted, no  nerve damage noted, no tendon damage noted, no underlying fracture noted and no vascular damage noted     Contaminated: no   Treatment:    Area cleansed with:  Chlorhexidine    Amount of cleaning:  Standard Skin repair:    Repair method:  Steri-Strips   Number of Steri-Strips:  3 Approximation:    Approximation:  Close Repair type:    Repair type:  Simple Post-procedure details:    Dressing:  Non-adherent dressing and splint for protection   Procedure completion:  Tolerated  (including critical care time) EKG  Pending results:  Labs Reviewed - No data to display  Medications Ordered in UC: Medications  Tdap (BOOSTRIX ) injection 0.5 mL (0.5 mLs Intramuscular Given 11/09/23 1821)    UC Diagnoses / Final Clinical Impressions(s)   I have reviewed the triage vital signs and the nursing notes.  Pertinent labs & imaging results that were available during my care of the patient were reviewed by me and considered in my medical decision making (see chart for details).    Final diagnoses:  Laceration of left middle finger without foreign body without damage to nail, initial encounter   Due to proximity of the laceration to the nailbed, suture was not possible.  Patient was provided with a 5-day course of Bactrim  for infection prophylaxis.  Recommend daily bandage changes, monitor wound for signs of infection and allow Steri-Strips to fall off on their own.  Please see discharge instructions below for details of plan of care as provided to patient. ED Prescriptions     Medication Sig Dispense Auth. Provider   sulfamethoxazole -trimethoprim  (BACTRIM  DS) 800-160 MG tablet Take 1 tablet by mouth 2 (two) times daily for 5 days. 10 tablet Joesph,  Manuelita Scales, PA-C      PDMP not reviewed this encounter.  Pending results:  Labs Reviewed - No data to display    Discharge Instructions      Consulte las instrucciones adjuntas sobre el cuidado de su laceracin en casa.  Me gustara  cambiar su vendaje diariamente y monitorear su herida en busca de signos de infeccin.  Comience a tomar Bactrim  para prevenir la infeccin en la herida.  Tome 1 comprimido toys 'r' us al da [employer] prximos 5 [place].  La Steri-Strip se caer por s sola, para cuando se caiga la herida estar completamente curada.  Gracias por visitar [facility] hoy.   Please see the enclosed instructions regarding caring for your laceration at home.  I would like to change your bandage daily and monitor your wound for signs of infection.  Please begin taking Bactrim  to prevent infection in the wound.  Take 1 tablet twice daily for the next 5 days.  The Steri-Strip will fall off on its own, by the time it falls off the wound will be completely healed.  Thank you for visiting [facility] today.      Disposition Upon Discharge:  Condition: stable for discharge home  Patient presented with an acute illness with associated systemic symptoms and significant discomfort requiring urgent management. In my opinion, this is a condition that a prudent lay person (someone who possesses an average knowledge of health and medicine) may potentially expect to result in complications if not addressed urgently such as respiratory distress, impairment of bodily function or dysfunction of bodily organs.   Routine symptom specific, illness specific and/or disease specific instructions were discussed with the patient and/or caregiver at length.   As such, the patient has been evaluated and assessed, work-up was performed and treatment was provided in alignment with urgent care protocols and evidence based medicine.  Patient/parent/caregiver has been advised that the patient may require follow up for further testing and treatment if the symptoms continue in spite of treatment, as clinically indicated and appropriate.  Patient/parent/caregiver has been advised to return to the [facility] or PCP if no better;  to PCP or the Emergency Department if new signs and symptoms develop, or if the current signs or symptoms continue to change or worsen for further workup, evaluation and treatment as clinically indicated and appropriate  The patient will follow up with their current PCP if and as advised. If the patient does not currently have a PCP we will assist them in obtaining one.   The patient may need specialty follow up if the symptoms continue, in spite of conservative treatment and management, for further workup, evaluation, consultation and treatment as clinically indicated and appropriate.  Patient/parent/caregiver verbalized understanding and agreement of plan as discussed.  All questions were addressed during visit.  Please see discharge instructions below for further details of plan.  This office note has been dictated using Teaching laboratory technician.  Unfortunately, this method of dictation can sometimes lead to typographical or grammatical errors.  I apologize for your inconvenience in advance if this occurs.  Please do not hesitate to reach out to me if clarification is needed.      Joesph Manuelita Scales, [place] 11/09/23 [phone]

## 2023-11-09 NOTE — ED Triage Notes (Signed)
 Pt states via interpretor that she cut her left middle finger while cutting some meat. laceration noted to the tip of left middle finger,bleeding controlled with pressure.

## 2023-11-09 NOTE — Discharge Instructions (Signed)
 Consulte las instrucciones adjuntas sobre el cuidado de su laceracin en casa.  Me gustara cambiar su vendaje diariamente y monitorear su herida en busca de signos de infeccin.  Comience a tomar Bactrim  para prevenir la infeccin en la herida.  Tome 1 comprimido toys 'r' us al da [employer] prximos 5 [place].  La Steri-Strip se caer por s sola, para cuando se caiga la herida estar completamente curada.  Gracias por visitar [facility] hoy.   Please see the enclosed instructions regarding caring for your laceration at home.  I would like to change your bandage daily and monitor your wound for signs of infection.  Please begin taking Bactrim  to prevent infection in the wound.  Take 1 tablet twice daily for the next 5 days.  The Steri-Strip will fall off on its own, by the time it falls off the wound will be completely healed.  Thank you for visiting [facility] today.

## 2023-11-09 NOTE — ED Notes (Signed)
 Pt taken to triage room laceration covered with sterile guaze and coban to apply pressure and control bleeding. There is minimal bleeding at the time of arrival.

## 2024-02-09 ENCOUNTER — Encounter: Payer: Self-pay | Admitting: Gastroenterology

## 2024-02-09 ENCOUNTER — Ambulatory Visit: Admitting: Gastroenterology

## 2024-02-09 VITALS — BP 136/70 | HR 76 | Ht 62.5 in | Wt 185.0 lb

## 2024-02-09 DIAGNOSIS — K5732 Diverticulitis of large intestine without perforation or abscess without bleeding: Secondary | ICD-10-CM | POA: Insufficient documentation

## 2024-02-09 DIAGNOSIS — R9389 Abnormal findings on diagnostic imaging of other specified body structures: Secondary | ICD-10-CM | POA: Insufficient documentation

## 2024-02-09 DIAGNOSIS — Z860101 Personal history of adenomatous and serrated colon polyps: Secondary | ICD-10-CM

## 2024-02-09 DIAGNOSIS — R933 Abnormal findings on diagnostic imaging of other parts of digestive tract: Secondary | ICD-10-CM

## 2024-02-09 DIAGNOSIS — R1032 Left lower quadrant pain: Secondary | ICD-10-CM | POA: Diagnosis not present

## 2024-02-09 DIAGNOSIS — K6289 Other specified diseases of anus and rectum: Secondary | ICD-10-CM | POA: Diagnosis not present

## 2024-02-09 DIAGNOSIS — Z860100 Personal history of colon polyps, unspecified: Secondary | ICD-10-CM | POA: Insufficient documentation

## 2024-02-09 MED ORDER — NA SULFATE-K SULFATE-MG SULF 17.5-3.13-1.6 GM/177ML PO SOLN: 1.0000 | Freq: Once | ORAL | 0 refills | Status: AC

## 2024-02-09 MED ORDER — HYDROCORTISONE ACETATE 25 MG RE SUPP: 25.0000 mg | Freq: Every evening | RECTAL | 1 refills | Status: DC

## 2024-02-09 NOTE — Progress Notes (Signed)
 02/09/2024 Stephanie Gibson [id number] August 11, 1966   HISTORY OF PRESENT ILLNESS: This is a 58 year old female who is a patient of Dr. Elana Alm, known to her only for colonoscopy in 12/2017.  Saltz below.  Repeat recommended a 7-year interval, due February 2026.  She is here today at the request of her PCP, Dr. Toney Reil, for evaluation regards to diverticulitis.  She tells me that earlier in March she developed left lower quadrant abdominal pain.  She was treated with an antibiotic, only single antibiotic, possibly Augmentin although not completely sure.  She was treated for a week.  Around the time she completed the antibiotic she had a CT scan done through [facility] as follows:  CT scan abdomen and pelvis with contrast 01/20/2024:  IMPRESSION:  1. Diverticulosis of the descending colon with associated sigmoid thickening, possible chronic/smoldering diverticulitis. No significant pericolonic fat stranding.  2.  Loops of small bowel in the central dependent pelvis demonstrate mild thickening/hyperenhancement, possible nonspecific enteritis.  3.  Diffuse hepatic steatosis.  4.  Left lower lobe pulmonary nodules, largest measuring 5 mm. Recommendations as below.   Tells me she is not given any further antibiotics after the CT scan.  Her pain has resolved for the most part, still just feels a little uncomfortable in that area.  Never had diverticulitis in the past.  She does describe just since yesterday having a soreness up inside her rectum.  She says that she had 3 bowel movements yesterday so feels like things got irritated.  First she tells me that she has issues with constipation, but then she says that she drinks tea and for the most part moves her bowels every day.  Colonoscopy 12/2017:  - One 2 mm polyp in the cecum, removed with a cold biopsy forceps. Resected and retrieved. - One 3 mm polyp in the rectum, removed with a cold snare. Resected and retrieved. - Diverticulosis in the  sigmoid colon. - Non- bleeding internal hemorrhoids.  1. Surgical [P], cecum, polyp - TUBULAR ADENOMA (ONE FRAGMENT). - NO HIGH GRADE DYSPLASIA OR MALIGNANCY. 2. Surgical [P], rectum, polyp - MUCOSAL PROLAPSE POLYP WITH MILD INFLAMMATION (ONE FRAGMENT). - NO ADENOMATOUS CHANGE OR MALIGNANCY.  Of note, patient is primarily Spanish-speaking so is an interpreter was present during the entirety of the visit.  Despite interpreter being present there was still somewhat of a communication barrier as she was not extremely knowledgeable about timeline of events, medication she was given, etc.  Past Medical History:  Diagnosis Date   Anemia    Anxiety    Arthritis    Asthma    Ectopic pregnancy    Essential hypertension    Heart murmur    Leiomyoma of uterus    Pneumonia    Shortness of breath dyspnea    Past Surgical History:  Procedure Laterality Date   BRONCHIAL WASHINGS  09/01/2022   Procedure: BRONCHIAL WASHINGS;  Surgeon: Lupita Leash, MD;  Location: [facility] ENDOSCOPY;  Service: Cardiopulmonary;;   CESAREAN SECTION     CYSTOSCOPY N/A 10/21/2015   Procedure: Bluford Kaufmann;  Surgeon: Ok Edwards, MD;  Location: [facility];  Service: Gynecology;  Laterality: N/A;   LAPAROSCOPIC VAGINAL HYSTERECTOMY WITH SALPINGO OOPHORECTOMY N/A 10/21/2015   Procedure: LAPAROSCOPIC ASSISTED VAGINAL HYSTERECTOMY;  Surgeon: Ok Edwards, MD;  Location: [facility];  Service: Gynecology;  Laterality: N/A;   OOPHORECTOMY     SALPINGOOPHORECTOMY Left 10/21/2015   Procedure: SALPINGO OOPHORECTOMY;  Surgeon: Ok Edwards, MD;  Location: [facility]S;  Service: Gynecology;  Laterality: Left;   TUBAL LIGATION     VIDEO BRONCHOSCOPY N/A 09/01/2022   Procedure: VIDEO BRONCHOSCOPY WITHOUT FLUORO;  Surgeon: Lupita Leash, MD;  Location: [facility] ENDOSCOPY;  Service: Cardiopulmonary;  Laterality: N/A;    reports that she has never smoked. She has never been exposed to tobacco smoke. She has never used smokeless  tobacco. She reports that she does not currently use alcohol. She reports that she does not use drugs. family history includes Arthritis in her mother; Bladder Cancer in her maternal aunt; Hypertension in her brother, mother, and sister; Other in her mother; Stomach cancer in her maternal aunt; Throat cancer in her father; Thyroid disease in her sister; Uterine cancer in her maternal aunt. No Known Allergies    Outpatient Encounter Medications as of 02/09/2024  Medication Sig   albuterol (VENTOLIN HFA) 108 (90 Base) MCG/ACT inhaler Inhale 1-2 puffs into the lungs every 6 (six) hours as needed.   benzonatate (TESSALON) 200 MG capsule Take 1 capsule (200 mg total) by mouth 3 (three) times daily as needed for cough.   diclofenac (VOLTAREN) 75 MG EC tablet Take 1 tablet (75 mg total) by mouth 2 (two) times daily.   fluticasone (FLONASE) 50 MCG/ACT nasal spray Place 2 sprays into both nostrils daily.   fluticasone-salmeterol (WIXELA INHUB) 100-50 MCG/ACT AEPB Inhale 1 puff into the lungs 2 (two) times daily.   hydrOXYzine (VISTARIL) 100 MG capsule Take 1 capsule (100 mg total) by mouth at bedtime.   ipratropium (ATROVENT) 0.06 % nasal spray Place 2 sprays into both nostrils 4 (four) times daily.   lisinopril-hydrochlorothiazide (ZESTORETIC) 20-25 MG tablet Take 1 tablet by mouth daily.   montelukast (SINGULAIR) 10 MG tablet Take 10 mg by mouth daily.   traZODone (DESYREL) 50 MG tablet Take 50 mg by mouth as needed.   [DISCONTINUED] amLODipine (NORVASC) 5 MG tablet Take 1 tablet (5 mg total) by mouth daily.   [DISCONTINUED] dextromethorphan-guaiFENesin (MUCINEX DM) 30-600 MG 12hr tablet Take 2 tablets by mouth 2 (two) times daily.   [DISCONTINUED] estradiol (ESTRACE) 1 MG tablet Take 1 tablet (1 mg total) by mouth daily.   [DISCONTINUED] hydrochlorothiazide (HYDRODIURIL) 25 MG tablet Take 1 tablet (25 mg total) by mouth daily.   [DISCONTINUED] lisinopril (ZESTRIL) 10 MG tablet Take 1 tablet by mouth  every morning.   [DISCONTINUED] oseltamivir (TAMIFLU) 75 MG capsule Take 1 capsule (75 mg total) by mouth 2 (two) times daily.   [DISCONTINUED] pantoprazole (PROTONIX) 40 MG tablet Take 1 tablet (40 mg total) by mouth daily.   No facility-administered encounter medications on file as of 02/09/2024.     REVIEW OF SYSTEMS  : All other systems reviewed and negative except where noted in the History of Present Illness.   PHYSICAL EXAM: BP (!) 160/72 (BP Location: Left Arm, Patient Position: Sitting, Cuff Size: Normal)   Pulse 76   Ht 5' 2.5" (1.588 m) Comment: height measured without shoes  Wt 185 lb (83.9 kg)   LMP 08/10/2015 (Approximate) Comment: continuous bleeding/spotting for months  BMI 33.30 kg/m  General: Well developed female in no acute distress Head: Normocephalic and atraumatic Eyes:  Sclerae anicteric, conjunctiva pink. Ears: Normal auditory acuity Lungs: Clear throughout to auscultation; no W/R/R. Heart: Regular rate and rhythm; no M/R/G. Abdomen: Soft, non-distended.  BS present.  Non-tender. Rectal:  Will be done at the time of colonoscopy. Musculoskeletal: Symmetrical with no gross deformities  Skin: No lesions on visible extremities Extremities: No edema  Neurological: Alert  oriented x 4, grossly non-focal Psychological:  Alert and cooperative. Normal mood and affect  ASSESSMENT AND PLAN: *Left lower quadrant abdominal pain with diverticulitis on CT scan.  This showed sigmoid thickening, possible chronic/smoldering diverticulitis.  Was treated with antibiotics, possibly Augmentin, for a week prior to the CT scan.  That was a couple of weeks ago.  Pain significantly improved still just minimally uncomfortable in the left lower quadrant.  Never had diverticulitis in the past.  She reports just wanting to make sure everything is okay.  Even with interpreter still somewhat of a language barrier.  Will plan to proceed with colonoscopy with Dr. Lavon Paganini, but we will push that  out about another month since she was just treated for the diverticulitis.  The risks, benefits, and alternatives to colonoscopy were discussed with the patient and she consents to proceed.  She will contact us with any recurrent or worsening abdominal pain in the meantime at which time we would need to consider repeat CT scan, repeat treatment with antibiotics, possibly pushing colonoscopy out further. *Personal history of colon polyps: 1 tubular adenoma removed in February 2019.  Recall was placed February 2026.  Will plan for repeat colonoscopy sooner due to above symptoms and findings. *Rectal discomfort: Having some discomfort inside her rectum just since yesterday.  Says she had 3 bowel movements yesterday and feels like things became irritated.  Will prescribe hydrocortisone suppository for her to use at bedtime for the next 5 days or so.  Prescription sent to pharmacy.   CC:  Lupita Leash, MD CC:  Dr. Toney Reil

## 2024-02-09 NOTE — Patient Instructions (Addendum)
 Hemos enviado los siguientes medicamentos a su farmacia para que los recoja cuando le resulte conveniente: Supositorios de hidrocortisona cada noche durante 5 [place].  Contctenos si el dolor regresa antes de la colonoscopia.  Se le ha programado una colonoscopia. Siga las instrucciones escritas que le entregamos en su visita de hoy.  Si [place] inhaladores (aunque solo sean necesarios), Armed forces operational officer del procedimiento.  NO TOMAR 7 DAS ANTES DE LA PRUEBA: Trulicity (dulaglutida) Ozempic, Wegovy (semaglutida) Mounjaro (tirzepatida) Bydureon Bcise (exanatida de liberacin prolongada)  NO TOMAR 1 DA ANTES DE LA PRUEBA: Rybelsus (semaglutida) Adlyxin (lixisenatida) Victoza (liraglutida) Byetta (exanatida)  ______________________________________________________________  We have sent the following medications to your pharmacy for you to pick up at your convenience: Hydrocortisone suppositories nightly for 5 days.  Contact us if pain returns before colonoscopy.   You have been scheduled for a colonoscopy. Please follow written instructions given to you at your visit today.   If you use inhalers (even only as needed), please bring them with you on the day of your procedure.  DO NOT TAKE 7 DAYS PRIOR TO TEST- Trulicity (dulaglutide) Ozempic, Wegovy (semaglutide) Mounjaro (tirzepatide) Bydureon Bcise (exanatide extended release)  DO NOT TAKE 1 DAY PRIOR TO YOUR TEST Rybelsus (semaglutide) Adlyxin (lixisenatide) Victoza (liraglutide) Byetta (exanatide) _____________________________________________________  If your blood pressure at your visit was 140/90 or greater, please contact your primary care physician to follow up on this.  _______________________________________________________  If you are age 21 or older, your body mass index should be between 23-30. Your Body mass index is 33.3 kg/m. If this is out of the aforementioned range listed, please consider follow up with your  Primary Care Provider.  If you are age 55 or younger, your body mass index should be between 19-25. Your Body mass index is 33.3 kg/m. If this is out of the aformentioned range listed, please consider follow up with your Primary Care Provider.   ________________________________________________________  The [facility] providers would like to encourage you to use [facility] to communicate with providers for non-urgent requests or questions.  Due to long hold times on the telephone, sending your provider a message by [facility] may be a faster and more efficient way to get a response.  Please allow 48 business hours for a response.  Please remember that this is for non-urgent requests.  _______________________________________________________

## 2024-03-09 ENCOUNTER — Encounter: Payer: Self-pay | Admitting: Gastroenterology

## 2024-03-16 ENCOUNTER — Ambulatory Visit (AMBULATORY_SURGERY_CENTER): Admitting: Gastroenterology

## 2024-03-16 ENCOUNTER — Encounter: Payer: Self-pay | Admitting: Gastroenterology

## 2024-03-16 VITALS — BP 135/70 | HR 61 | Temp 98.2°F | Resp 11 | Ht 62.0 in | Wt 185.0 lb

## 2024-03-16 DIAGNOSIS — K644 Residual hemorrhoidal skin tags: Secondary | ICD-10-CM | POA: Diagnosis not present

## 2024-03-16 DIAGNOSIS — D120 Benign neoplasm of cecum: Secondary | ICD-10-CM

## 2024-03-16 DIAGNOSIS — K648 Other hemorrhoids: Secondary | ICD-10-CM | POA: Diagnosis not present

## 2024-03-16 DIAGNOSIS — D123 Benign neoplasm of transverse colon: Secondary | ICD-10-CM

## 2024-03-16 DIAGNOSIS — D122 Benign neoplasm of ascending colon: Secondary | ICD-10-CM

## 2024-03-16 DIAGNOSIS — R9389 Abnormal findings on diagnostic imaging of other specified body structures: Secondary | ICD-10-CM

## 2024-03-16 DIAGNOSIS — K5140 Inflammatory polyps of colon without complications: Secondary | ICD-10-CM

## 2024-03-16 DIAGNOSIS — K5730 Diverticulosis of large intestine without perforation or abscess without bleeding: Secondary | ICD-10-CM

## 2024-03-16 MED ORDER — SODIUM CHLORIDE 0.9 % IV SOLN: 500.0000 mL | INTRAVENOUS | Status: DC

## 2024-03-16 NOTE — Progress Notes (Signed)
 Called to room to assist during endoscopic procedure.  Patient ID and intended procedure confirmed with present staff. Received instructions for my participation in the procedure from the performing physician.

## 2024-03-16 NOTE — Progress Notes (Signed)
 [facility] History and Physical   Primary Care Physician:  Marine Sia, MD   Reason for Procedure:  Abnormal CT, sigmoid colon thickening Plan:    colonoscopy with possible interventions as needed     HPI: Stephanie Gibson is a very pleasant 58 y.o. female here for  colonoscopy for sigmoid thickening, abnormal CT. Please refer to office visit note by Jessica Zehr for additional details  The risks and benefits as well as alternatives of endoscopic procedure(s) have been discussed and reviewed. All questions answered. The patient agrees to proceed.    Past Medical History:  Diagnosis Date   Anemia    Anxiety    Arthritis    Asthma    Ectopic pregnancy    Essential hypertension    Heart murmur    Leiomyoma of uterus    Pneumonia    Shortness of breath dyspnea     Past Surgical History:  Procedure Laterality Date   BRONCHIAL WASHINGS  09/01/2022   Procedure: BRONCHIAL WASHINGS;  Surgeon: Marine Sia, MD;  Location: [facility] ENDOSCOPY;  Service: Cardiopulmonary;;   CESAREAN SECTION     CYSTOSCOPY N/A 10/21/2015   Procedure: Orin Birk;  Surgeon: Davia Erps, MD;  Location: [facility] ORS;  Service: Gynecology;  Laterality: N/A;   LAPAROSCOPIC VAGINAL HYSTERECTOMY WITH SALPINGO OOPHORECTOMY N/A 10/21/2015   Procedure: LAPAROSCOPIC ASSISTED VAGINAL HYSTERECTOMY;  Surgeon: Davia Erps, MD;  Location: [facility] ORS;  Service: Gynecology;  Laterality: N/A;   OOPHORECTOMY     SALPINGOOPHORECTOMY Left 10/21/2015   Procedure: SALPINGO OOPHORECTOMY;  Surgeon: Davia Erps, MD;  Location: [facility] ORS;  Service: Gynecology;  Laterality: Left;   TUBAL LIGATION     VIDEO BRONCHOSCOPY N/A 09/01/2022   Procedure: VIDEO BRONCHOSCOPY WITHOUT FLUORO;  Surgeon: Marine Sia, MD;  Location: [facility] ENDOSCOPY;  Service: Cardiopulmonary;  Laterality: N/A;    Prior to Admission medications   Medication Sig Start Date End Date Taking? Authorizing Provider  doxycycline   (VIBRAMYCIN ) 100 MG capsule Take 100 mg by mouth 2 (two) times daily. 03/06/24  Yes [provider]  Na Sulfate-K Sulfate-Mg Sulfate concentrate (SUPREP) 17.5-3.13-1.6 GM/177ML SOLN  02/09/24  Yes [provider]  albuterol  (VENTOLIN  HFA) 108 (90 Base) MCG/ACT inhaler Inhale 1-2 puffs into the lungs every 6 (six) hours as needed. 11/12/22   McQuaid, Douglas B, MD  amLODipine  (NORVASC ) 5 MG tablet Take 5 mg by mouth daily.    [provider]  benzonatate  (TESSALON ) 200 MG capsule Take 1 capsule (200 mg total) by mouth 3 (three) times daily as needed for cough. 08/06/22   Cobb, Mariah Shines, NP  diclofenac  (VOLTAREN ) 75 MG EC tablet Take 1 tablet (75 mg total) by mouth 2 (two) times daily. 03/12/21   Sagardia, Miguel Jose, MD  fluticasone  (FLONASE ) 50 MCG/ACT nasal spray Place 2 sprays into both nostrils daily. 08/13/22   Cobb, Mariah Shines, NP  fluticasone -salmeterol (WIXELA INHUB) 100-50 MCG/ACT AEPB Inhale 1 puff into the lungs 2 (two) times daily. 11/12/22   McQuaid, Douglas B, MD  hydrocortisone  (ANUSOL -HC) 25 MG suppository Place 1 suppository (25 mg total) rectally at bedtime. 02/09/24   Zehr, Jessica D, PA-C  hydrOXYzine  (VISTARIL ) 100 MG capsule Take 1 capsule (100 mg total) by mouth at bedtime. 02/20/18   McVey, Claud Crumb, PA-C  ipratropium (ATROVENT ) 0.06 % nasal spray Place 2 sprays into both nostrils 4 (four) times daily. 09/10/22   Antonio Baumgarten, NP  lisinopril-hydrochlorothiazide  (ZESTORETIC) 20-25 MG tablet Take 1 tablet by  mouth daily. 01/14/24   [provider]  montelukast (SINGULAIR) 10 MG tablet Take 10 mg by mouth daily.    [provider]  traZODone (DESYREL) 50 MG tablet Take 50 mg by mouth as needed.    [provider]    Current Outpatient Medications  Medication Sig Dispense Refill   doxycycline  (VIBRAMYCIN ) 100 MG capsule Take 100 mg by mouth 2 (two) times daily.     Na Sulfate-K Sulfate-Mg Sulfate concentrate (SUPREP)  17.5-3.13-1.6 GM/177ML SOLN      albuterol  (VENTOLIN  HFA) 108 (90 Base) MCG/ACT inhaler Inhale 1-2 puffs into the lungs every 6 (six) hours as needed. 18 g 3   amLODipine  (NORVASC ) 5 MG tablet Take 5 mg by mouth daily.     benzonatate  (TESSALON ) 200 MG capsule Take 1 capsule (200 mg total) by mouth 3 (three) times daily as needed for cough. 30 capsule 1   diclofenac  (VOLTAREN ) 75 MG EC tablet Take 1 tablet (75 mg total) by mouth 2 (two) times daily. 30 tablet 0   fluticasone  (FLONASE ) 50 MCG/ACT nasal spray Place 2 sprays into both nostrils daily. 18.2 mL 2   fluticasone -salmeterol (WIXELA INHUB) 100-50 MCG/ACT AEPB Inhale 1 puff into the lungs 2 (two) times daily. 60 each 5   hydrocortisone  (ANUSOL -HC) 25 MG suppository Place 1 suppository (25 mg total) rectally at bedtime. 5 suppository 1   hydrOXYzine  (VISTARIL ) 100 MG capsule Take 1 capsule (100 mg total) by mouth at bedtime. 30 capsule 1   ipratropium (ATROVENT ) 0.06 % nasal spray Place 2 sprays into both nostrils 4 (four) times daily. 15 mL 12   lisinopril-hydrochlorothiazide  (ZESTORETIC) 20-25 MG tablet Take 1 tablet by mouth daily.     montelukast (SINGULAIR) 10 MG tablet Take 10 mg by mouth daily.     traZODone (DESYREL) 50 MG tablet Take 50 mg by mouth as needed.     Current Facility-Administered Medications  Medication Dose Route Frequency Provider Last Rate Last Admin   0.9 %  sodium chloride  infusion  500 mL Intravenous Continuous Haydon Dorris V, MD        Allergies as of 03/16/2024   (No Known Allergies)    Family History  Problem Relation Age of Onset   Hypertension Mother    Arthritis Mother    Other Mother        fatty liver   Esophageal cancer Father    Throat cancer Father    Hypertension Sister    Thyroid disease Sister    Hypertension Brother    Bladder Cancer Maternal Aunt    Uterine cancer Maternal Aunt    Stomach cancer Maternal Aunt    Colon cancer Neg Hx    Rectal cancer Neg Hx     Social  History   Socioeconomic History   Marital status: Married    Spouse name: Not on file   Number of children: 2   Years of education: Not on file   Highest education level: Not on file  Occupational History   Not on file  Tobacco Use   Smoking status: Never    Passive exposure: Never   Smokeless tobacco: Never  Vaping Use   Vaping status: Never Used  Substance and Sexual Activity   Alcohol use: Not Currently    Comment: occ   Drug use: No   Sexual activity: Yes    Birth control/protection: None    Comment: INTERCOURSE AGE 64, SEXUAL PARTNERS LESS THAN 5, current partner-  22 yrs  Other Topics Concern   Not on file  Social History Narrative   Not on file   Social Drivers of Health   Financial Resource Strain: Low Risk  (02/10/2024)   [facility]   Overall Financial Resource Strain (CARDIA)    Difficulty of Paying Living Expenses: Not hard at all  Food Insecurity: No Food Insecurity (02/10/2024)   [facility]   Hunger Vital Sign    Worried About Running Out of Food in the Last Year: Never true    Ran Out of Food in the Last Year: Never true  Transportation Needs: No Transportation Needs (02/10/2024)   Received from [facility] - Transportation    Lack of Transportation (Medical): No    Lack of Transportation (Non-Medical): No  Physical Activity: Not on file  Stress: Not on file  Social Connections: Not on file  Intimate Partner Violence: Not on file    Review of Systems:  All other review of systems negative except as mentioned in the HPI.  Physical Exam: Vital signs in last 24 hours: BP (!) 157/79   Pulse 68   Temp 98.2 F (36.8 C) (Temporal)   Ht 5\' 2"  (1.575 m)   Wt 185 lb (83.9 kg)   LMP 08/10/2015 (Approximate) Comment: continuous bleeding/spotting for months  SpO2 97%   BMI 33.84 kg/m  General:   Alert, NAD Lungs:  Clear .   Heart:  Regular rate and rhythm Abdomen:  Soft, nontender and  nondistended. Neuro/Psych:  Alert and cooperative. Normal mood and affect. A and O x 3  Reviewed labs, radiology imaging, old records and pertinent past GI work up  Patient is appropriate for planned procedure(s) and anesthesia in an ambulatory setting   K. Veena Lennox Dolberry , MD [phone]

## 2024-03-16 NOTE — Patient Instructions (Addendum)
 Resume previous diet Continue present medications Await pathology results  Handouts/information given for polyps, diverticulosis, high fiber diet and hemorrhoids  USTED TUVO UN PROCEDIMIENTO ENDOSCPICO HOY EN EL [facility]:   Lea el informe del procedimiento que se le entreg para cualquier pregunta especfica sobre lo que se Dentist.  Si el informe del examen no responde a sus preguntas, por favor llame a su gastroenterlogo para aclararlo.  Si usted solicit que no se le den [employer] de lo que se Clinical cytogeneticist en su procedimiento al [employer] va a cuidar, entonces el informe del procedimiento se ha incluido en un sobre sellado para que usted lo revise despus cuando le sea ms conveniente.   LO QUE PUEDE ESPERAR: Algunas sensaciones de hinchazn en el abdomen.  Puede tener ms gases de lo normal.  El caminar puede ayudarle a eliminar el aire que se le puso en el tracto gastrointestinal durante el procedimiento y reducir la hinchazn.  Si le hicieron una endoscopia inferior (como una colonoscopia o una sigmoidoscopia flexible), podra notar manchas de sangre en las heces fecales o en el papel higinico.  Si se someti a una preparacin intestinal para su procedimiento, es posible que no tenga una evacuacin intestinal normal durante [employer].   Tenga en cuenta:  Es posible que note un poco de irritacin y congestin en la nariz o algn drenaje.  Esto es debido al oxgeno [employer] durante su procedimiento.  No hay que preocuparse y esto debe desaparecer ms o Regulatory affairs officer.   SNTOMAS PARA REPORTAR INMEDIATAMENTE:  Despus de una endoscopia inferior (colonoscopia ):  Cantidades excesivas de sangre en las heces fecales  Sensibilidad significativa o empeoramiento de los dolores abdominales   Hinchazn aguda del abdomen que antes no tena   Fiebre de 100F o ms   Para asuntos urgentes o de Associate Professor, puede comunicarse con un gastroenterlogo a cualquier  hora llamando al [phone].  DIETA:  Recomendamos una comida pequea al principio, pero luego puede continuar con su dieta normal.  Tome muchos lquidos, pero debe evitar las bebidas alcohlicas durante 24 horas.    ACTIVIDAD:  Debe planear tomarse las cosas con calma por el resto del da y no debe CONDUCIR ni usar maquinaria pesada Patent examiner (debido a los medicamentos de sedacin utilizados durante el examen).     SEGUIMIENTO: Nuestro personal llamar al nmero que aparece en su historial al siguiente da hbil de su procedimiento para ver cmo se siente y para responder cualquier pregunta o inquietud que pueda tener con respecto a la informacin que se le dio despus del procedimiento. Si no podemos contactarle, le dejaremos un mensaje.  Sin embargo, si se siente bien y no tiene English as a second language teacher, no es necesario que nos devuelva la llamada.  Asumiremos que ha regresado a sus actividades diarias normales sin incidentes. Si se le tomaron algunas biopsias, le contactaremos por telfono o por carta en las prximas 3 semanas.  Si no ha sabido [employer] biopsias en el transcurso de 3 semanas, por favor llmenos al [phone].   FIRMAS/CONFIDENCIALIDAD: Usted y/o el acompaante que le cuide han firmado documentos que se ingresarn en su historial mdico electrnico.  Estas firmas atestiguan el hecho de que la informacin anterior

## 2024-03-16 NOTE — Progress Notes (Signed)
 Vss nad trans to pacu

## 2024-03-16 NOTE — Op Note (Signed)
 [facility] Patient Name: Stephanie Gibson Procedure Date: 03/16/2024 1:31 PM MRN: [id number] Endoscopist: Sergio Dandy , MD, [id number] Age: 58 Referring MD:  Date of Birth: January 04, 1966 Gender: Female Account #: [id number] Procedure:                Colonoscopy Indications:              Abnormal CT of the GI tract Medicines:                Monitored Anesthesia Care Procedure:                Pre-Anesthesia Assessment:                           - Prior to the procedure, a History and Physical                            was performed, and patient medications and                            allergies were reviewed. The patient's tolerance of                            previous anesthesia was also reviewed. The risks                            and benefits of the procedure and the sedation                            options and risks were discussed with the patient.                            All questions were answered, and informed consent                            was obtained. Prior Anticoagulants: The patient has                            taken no anticoagulant or antiplatelet agents. ASA                            Grade Assessment: II - A patient with mild systemic                            disease. After reviewing the risks and benefits,                            the patient was deemed in satisfactory condition to                            undergo the procedure.                           After obtaining informed consent, the colonoscope  was passed under direct vision. Throughout the                            procedure, the patient's blood pressure, pulse, and                            oxygen saturations were monitored continuously. The                            [id number]-HQ190L Colonoscope [id number] was introduced                            through the anus and advanced to the the cecum,                            identified by appendiceal  orifice and ileocecal                            valve. The colonoscopy was performed without                            difficulty. The patient tolerated the procedure                            well. The quality of the bowel preparation was                            good. The ileocecal valve, appendiceal orifice, and                            rectum were photographed. Scope In: 1:39:13 PM Scope Out: 2:01:09 PM Scope Withdrawal Time: 0 hours 15 minutes 1 second  Total Procedure Duration: 0 hours 21 minutes 56 seconds  Findings:                 The perianal and digital rectal examinations were                            normal.                           Seven sessile polyps were found in the transverse                            colon X 2, ascending colon X 4 and cecum X 1. The                            polyps were 4 to 9 mm in size. These polyps were                            removed with a cold snare. Resection and retrieval                            were complete.  Scattered small-mouthed diverticula were found in                            the sigmoid colon, descending colon and ascending                            colon.                           Non-bleeding external and internal hemorrhoids were                            found during retroflexion. The hemorrhoids were                            medium-sized. Complications:            No immediate complications. Estimated Blood Loss:     Estimated blood loss was minimal. Impression:               - Seven 4 to 9 mm polyps in the transverse colon,                            in the ascending colon and in the cecum, removed                            with a cold snare. Resected and retrieved.                           - Diverticulosis in the sigmoid colon, in the                            descending colon and in the ascending colon.                           - Non-bleeding external and internal  hemorrhoids. Recommendation:           - Patient has a contact number available for                            emergencies. The signs and symptoms of potential                            delayed complications were discussed with the                            patient. Return to normal activities tomorrow.                            Written discharge instructions were provided to the                            patient.                           - Resume previous diet.                           -  Continue present medications.                           - Await pathology results.                           - Repeat colonoscopy in 3 years for surveillance                            based on pathology results. Haron Beilke V. Juelz Claar, MD 03/16/2024 2:14:37 PM This report has been signed electronically.

## 2024-03-19 ENCOUNTER — Telehealth: Payer: Self-pay

## 2024-03-19 ENCOUNTER — Telehealth: Payer: Self-pay | Admitting: *Deleted

## 2024-03-19 NOTE — Telephone Encounter (Signed)
 No answer after follow up call. Voice message left.

## 2024-03-19 NOTE — Telephone Encounter (Signed)
 Chart entered in error

## 2024-03-21 LAB — SURGICAL PATHOLOGY

## 2024-04-16 ENCOUNTER — Ambulatory Visit: Payer: Self-pay | Admitting: Gastroenterology

## 2024-07-12 ENCOUNTER — Encounter: Payer: Self-pay | Admitting: Nurse Practitioner

## 2024-07-12 ENCOUNTER — Ambulatory Visit: Admitting: Nurse Practitioner

## 2024-07-12 VITALS — BP 130/78 | HR 82 | Ht 63.0 in | Wt 186.6 lb

## 2024-07-12 DIAGNOSIS — J4540 Moderate persistent asthma, uncomplicated: Secondary | ICD-10-CM | POA: Diagnosis not present

## 2024-07-12 DIAGNOSIS — J309 Allergic rhinitis, unspecified: Secondary | ICD-10-CM

## 2024-07-12 DIAGNOSIS — J418 Mixed simple and mucopurulent chronic bronchitis: Secondary | ICD-10-CM

## 2024-07-12 MED ORDER — INCRUSE ELLIPTA 62.5 MCG/ACT IN AEPB: 1.0000 | INHALATION_SPRAY | Freq: Every day | RESPIRATORY_TRACT | 11 refills | Status: DC

## 2024-07-12 NOTE — Progress Notes (Signed)
 @Patient  ID: Stephanie Gibson, female    DOB: 01-08-1966, 58 y.o.   MRN: [id number]  Chief Complaint  Patient presents with   Asthma    ACT 16    Referring provider: Hinojosa-Clapp, Marcela*  HPI: 58 year old female, never smoker followed for asthma, chronic bronchitis.  She is a former patient of Dr. Spurgeon and last seen in office 11/12/2022.  Past medical history significant for hypertension, osteoarthritis, seasonal allergies.  She was initially referred due to persistent dyspnea, back pain, chest pain and fatigue.  Was feeling like she was having a lot of chest pressure.  Symptoms initially started in December when she went to [place], but reoccurred numerous times afterwards.  She was treated with amoxicillin and prescribed an inhaler.  She was told that she may have asthma when she was in [place].  She was also provided with an albuterol  inhaler and started on Singulair.  Treated with antibiotics 3-4 times since.  She did not have any history of lung problems.  She reported a persistent cough with phlegm every day.  Occasionally with some blood in it.  TEST/EVENTS:  07/09/2022: Eos 0, IgE normal 07/09/2022: Sputum culture with heavy growth of haemophilus parainfluenzae 07/09/2022: Lungs are clear 08/05/2022 CT chest with contrast: No LAD.  Trachea and mainstem bronchi are patent.  There is a subtle 5 mm density in the right lung which is stable and compatible with benign finding.  3 mm nodular density in the left lower lobe which is also stable and considered benign.  No acute process identified. 08/08/2022 CTA chest: No evidence of PE.  Minimal bibasilar atelectasis.  No focal airspace opacity to indicate pneumonia.  4 mm nodule in the right middle lobe is unchanged and indicates benign etiology. 09/01/2022 bronchoscopy: negative cultures; cytology negative for malignancy   11/12/2022: OV with Dr. McQuaid. Doing fairly well until last night. Not using Wixela. Still using albuterol  3-10 times a  week. New onset bodyaches, sinus congestion, sore throat. Treated with tamiflu . Advised to start Wixela twice daily, regardless of symptoms. Manage sinus symptoms with flonase .  07/12/2024: Today - overdue follow up Patient presents today for overdue follow up. She is with a friend, who interprets for her. Declined medical interpretor.  She tells me she is feeling much better compared to last time she was here. She still gets short winded with walking or exertion. When she gets sick, she has a lot of phlegm production and short windedness. Otherwise, the cough and mucus issues have resolved. Her sinuses feel stable. She is using Wixela twice daily. Still using albuterol  usually once a day. No current wheezing, chest tightness, fevers, night sweats, hemoptysis.   No Known Allergies  Immunization History  Administered Date(s) Administered   Fluzone Influenza virus vaccine,trivalent (IIV3), split virus 10/15/2017   Influenza Split 10/09/2015   Influenza,inj,Quad PF,6+ Mos 10/09/2015, 10/15/2017, 01/16/2022   Influenza-Unspecified 08/20/2022   PFIZER(Purple Top)SARS-COV-2 Vaccination 01/19/2020, 02/11/2020   Tdap 11/09/2023    Past Medical History:  Diagnosis Date   Anemia    Anxiety    Arthritis    Asthma    Ectopic pregnancy    Essential hypertension    Heart murmur    Leiomyoma of uterus    Pneumonia    Shortness of breath dyspnea     Tobacco History: Social History   Tobacco Use  Smoking Status Never   Passive exposure: Never  Smokeless Tobacco Never   Counseling given: Not Answered   Outpatient Medications Prior to Visit  Medication Sig Dispense Refill   albuterol  (VENTOLIN  HFA) 108 (90 Base) MCG/ACT inhaler Inhale 1-2 puffs into the lungs every 6 (six) hours as needed. 18 g 3   amLODipine  (NORVASC ) 5 MG tablet Take 5 mg by mouth daily.     benzonatate  (TESSALON ) 200 MG capsule Take 1 capsule (200 mg total) by mouth 3 (three) times daily as needed for cough. 30 capsule 1    fluticasone  (FLONASE ) 50 MCG/ACT nasal spray Place 2 sprays into both nostrils daily. 18.2 mL 2   fluticasone -salmeterol (WIXELA INHUB) 100-50 MCG/ACT AEPB Inhale 1 puff into the lungs 2 (two) times daily. 60 each 5   hydrOXYzine  (VISTARIL ) 100 MG capsule Take 1 capsule (100 mg total) by mouth at bedtime. 30 capsule 1   ipratropium (ATROVENT ) 0.06 % nasal spray Place 2 sprays into both nostrils 4 (four) times daily. 15 mL 12   lisinopril-hydrochlorothiazide  (ZESTORETIC) 20-25 MG tablet Take 1 tablet by mouth daily.     montelukast (SINGULAIR) 10 MG tablet Take 10 mg by mouth daily.     No facility-administered medications prior to visit.     Review of Systems: As above    Physical Exam:  BP 130/78   Pulse 82   Ht 5' 3 (1.6 m)   Wt 186 lb 9.6 oz (84.6 kg)   LMP 08/10/2015 (Approximate) Comment: continuous bleeding/spotting for months  SpO2 96%   BMI 33.05 kg/m   GEN: Pleasant, interactive, well-appearing; obese; in no acute distress. HEENT:  Normocephalic and atraumatic. PERRLA. Sclera white. Nasal turbinates erythematous, moist and patent bilaterally. No rhinorrhea present. Oropharynx pink and moist, without exudate or edema. No lesions, ulcerations NECK:  Supple w/ fair ROM.No lymphadenopathy.   CV: RRR, no m/r/g PULMONARY:  Unlabored, regular breathing. Clear bilaterally A&P w/o wheezes/rales/rhonchi. No accessory muscle use.  GI: BS present and normoactive. Soft, non-tender to palpation.  MSK: No erythema, warmth or tenderness. No deformities or joint swelling noted.  Neuro: A/Ox3. No focal deficits noted.   Skin: Warm, no lesions or rashe Psych: Normal affect and behavior. Judgement and thought content appropriate.     Lab Results:  CBC    Component Value Date/Time   WBC 8.3 08/08/2022 1617   RBC 5.00 08/08/2022 1617   HGB 14.2 08/08/2022 1617   HCT 43.1 08/08/2022 1617   PLT 188 08/08/2022 1617   MCV 86.2 08/08/2022 1617   MCH 28.4 08/08/2022 1617   MCHC  32.9 08/08/2022 1617   RDW 14.4 08/08/2022 1617   LYMPHSABS 2.2 08/08/2022 1617   MONOABS 0.6 08/08/2022 1617   EOSABS 0.0 08/08/2022 1617   BASOSABS 0.1 08/08/2022 1617    BMET    Component Value Date/Time   NA 140 08/08/2022 1617   NA 140 03/25/2020 1027   K 3.3 (L) 08/08/2022 1617   CL 106 08/08/2022 1617   CO2 24 08/08/2022 1617   GLUCOSE 117 (H) 08/08/2022 1617   BUN 19 08/08/2022 1617   BUN 14 03/25/2020 1027   CREATININE 0.82 08/08/2022 1617   CREATININE 0.64 06/25/2016 0826   CALCIUM 9.3 08/08/2022 1617   GFRNONAA >60 08/08/2022 1617   GFRAA 114 03/25/2020 1027    BNP No results found for: BNP   Imaging:  No results found.  Administration History     None          Latest Ref Rng & Units 09/24/2022   10:56 AM  PFT Results  FVC-Pre L 3.12   FVC-Predicted Pre % 94  FVC-Post L 2.98   FVC-Predicted Post % 89   Pre FEV1/FVC % % 47   Post FEV1/FCV % % 86   FEV1-Pre L 1.48   FEV1-Predicted Pre % 57   FEV1-Post L 2.56   DLCO uncorrected ml/min/mmHg 20.86   DLCO UNC% % 103   DLCO corrected ml/min/mmHg 20.38   DLCO COR %Predicted % 101   DLVA Predicted % 110   TLC L 4.93   TLC % Predicted % 98   RV % Predicted % 97     No results found for: NITRICOXIDE      Assessment & Plan:   Moderate persistent asthma Moderate asthma without recent exacerbations. No hospitalizations. No recurrent episodes of hemoptysis. She is compliant with ICS/LABA. Still with daily use of SABA and symptoms of DOE on exertion. Will trial addition of LAMA therapy with Incruse. Reviewed side effect profile and proper administration technique. Encouraged to continue trigger prevention therapies. Action plan in place.  Patient Instructions  Continue Albuterol  inhaler 2 puffs every 6 hours as needed for shortness of breath or wheezing. Notify if symptoms persist despite rescue inhaler/neb use.  Continue protonix  40 mg daily  Continue Wixela 1 puff Twice daily. Brush  tongue and rinse mouth afterwards Continue Mucinex  600 mg to Twice daily for chest congestion/cough Continue delsym 2 tsp Twice daily as needed for cough  Continue Tessalon  perles (benzonatate ) 1 capsule Three times a day as needed for cough    Trial addition of Incruse 1 puff daily to see if this helps with your breathing. If you develop any urinary retention or vision changes, let me know   Follow up in 3 months with any new MD to establish care (30 min slot). If symptoms do not improve or worsen, please contact office for sooner follow up or seek emergency care.     Contine con el inhalador de albuterol , 2 inhalaciones cada 6 horas segn sea necesario para la dificultad para respirar o sibilancias. Notifique si los sntomas persisten a pesar del uso del Armed forces operational officer de Agricultural engineer. Contine con Protonix  40 mg al da. Contine con Wixela 1 inhalacin dos veces al [employer]. Cepllese la lengua y enjuguese la boca despus. Contine con Mucinex  600 mg dos veces al da para la congestin del pecho/tos. Contine con Delsym 2 cucharaditas dos veces al da segn sea necesario para la tos. Contine con Tessalon  perles (benzonatato) 1 cpsula tres veces al da segn sea necesario para la tos.  Pruebe aadiendo Incruse 1 inhalacin al da para ver si esto le ayuda con la respiracin. Si presenta retencin urinaria o cambios en la visin, avseme.  Consulte con un nuevo mdico en 3 meses para establecer la atencin (intervalo de 30 minutos). Si los sntomas no mejoran o empeoran, comunquese con el consultorio para un seguimiento ms rpido o busque atencin de Associate Professor.   Allergic rhinitis Continue aggressive management    I spent 35 minutes of dedicated to the care of this patient on the date of this encounter to include pre-visit review of records, face-to-face time with the patient discussing conditions above, post visit ordering of testing, clinical documentation with the electronic  health record, making appropriate referrals as documented, and communicating necessary findings to members of the patients care team.  Comer LULLA Rouleau, NP 07/12/2024  Pt aware and understands NP's role.

## 2024-07-12 NOTE — Assessment & Plan Note (Signed)
 Moderate asthma without recent exacerbations. No hospitalizations. No recurrent episodes of hemoptysis. She is compliant with ICS/LABA. Still with daily use of SABA and symptoms of DOE on exertion. Will trial addition of LAMA therapy with Incruse. Reviewed side effect profile and proper administration technique. Encouraged to continue trigger prevention therapies. Action plan in place.  Patient Instructions  Continue Albuterol  inhaler 2 puffs every 6 hours as needed for shortness of breath or wheezing. Notify if symptoms persist despite rescue inhaler/neb use.  Continue protonix  40 mg daily  Continue Wixela 1 puff Twice daily. Brush tongue and rinse mouth afterwards Continue Mucinex  600 mg to Twice daily for chest congestion/cough Continue delsym 2 tsp Twice daily as needed for cough  Continue Tessalon  perles (benzonatate ) 1 capsule Three times a day as needed for cough    Trial addition of Incruse 1 puff daily to see if this helps with your breathing. If you develop any urinary retention or vision changes, let me know   Follow up in 3 months with any new MD to establish care (30 min slot). If symptoms do not improve or worsen, please contact office for sooner follow up or seek emergency care.     Contine con el inhalador de albuterol , 2 inhalaciones cada 6 horas segn sea necesario para la dificultad para respirar o sibilancias. Notifique si los sntomas persisten a pesar del uso del Armed forces operational officer de Agricultural engineer. Contine con Protonix  40 mg al da. Contine con Wixela 1 inhalacin dos veces al [employer]. Cepllese la lengua y enjuguese la boca despus. Contine con Mucinex  600 mg dos veces al da para la congestin del pecho/tos. Contine con Delsym 2 cucharaditas dos veces al da segn sea necesario para la tos. Contine con Tessalon  perles (benzonatato) 1 cpsula tres veces al da segn sea necesario para la tos.  Pruebe aadiendo Incruse 1 inhalacin al da para ver si esto le ayuda con la  respiracin. Si presenta retencin urinaria o cambios en la visin, avseme.  Consulte con un nuevo mdico en 3 meses para establecer la atencin (intervalo de 30 minutos). Si los sntomas no mejoran o empeoran, comunquese con el consultorio para un seguimiento ms rpido o busque atencin de Associate Professor.

## 2024-07-12 NOTE — Assessment & Plan Note (Signed)
 Continue aggressive management

## 2024-07-12 NOTE — Patient Instructions (Addendum)
 Continue Albuterol  inhaler 2 puffs every 6 hours as needed for shortness of breath or wheezing. Notify if symptoms persist despite rescue inhaler/neb use.  Continue protonix  40 mg daily  Continue Wixela 1 puff Twice daily. Brush tongue and rinse mouth afterwards Continue Mucinex  600 mg to Twice daily for chest congestion/cough Continue delsym 2 tsp Twice daily as needed for cough  Continue Tessalon  perles (benzonatate ) 1 capsule Three times a day as needed for cough    Trial addition of Incruse 1 puff daily to see if this helps with your breathing. If you develop any urinary retention or vision changes, let me know   Follow up in 3 months with any new MD to establish care (30 min slot). If symptoms do not improve or worsen, please contact office for sooner follow up or seek emergency care.     Contine con el inhalador de albuterol , 2 inhalaciones cada 6 horas segn sea necesario para la dificultad para respirar o sibilancias. Notifique si los sntomas persisten a pesar del uso del Armed forces operational officer de Agricultural engineer. Contine con Protonix  40 mg al da. Contine con Wixela 1 inhalacin dos veces al [employer]. Cepllese la lengua y enjuguese la boca despus. Contine con Mucinex  600 mg dos veces al da para la congestin del pecho/tos. Contine con Delsym 2 cucharaditas dos veces al da segn sea necesario para la tos. Contine con Tessalon  perles (benzonatato) 1 cpsula tres veces al da segn sea necesario para la tos.  Pruebe aadiendo Incruse 1 inhalacin al da para ver si esto le ayuda con la respiracin. Si presenta retencin urinaria o cambios en la visin, avseme.  Consulte con un nuevo mdico en 3 meses para establecer la atencin (intervalo de 30 minutos). Si los sntomas no mejoran o empeoran, comunquese con el consultorio para un seguimiento ms rpido o busque atencin de Associate Professor.

## 2024-08-23 ENCOUNTER — Ambulatory Visit (INDEPENDENT_AMBULATORY_CARE_PROVIDER_SITE_OTHER): Admitting: Otolaryngology

## 2024-08-23 ENCOUNTER — Encounter (INDEPENDENT_AMBULATORY_CARE_PROVIDER_SITE_OTHER): Payer: Self-pay | Admitting: Otolaryngology

## 2024-08-23 VITALS — BP 146/76 | HR 64

## 2024-08-23 DIAGNOSIS — R519 Headache, unspecified: Secondary | ICD-10-CM | POA: Diagnosis not present

## 2024-08-23 DIAGNOSIS — J3089 Other allergic rhinitis: Secondary | ICD-10-CM

## 2024-08-23 DIAGNOSIS — R0982 Postnasal drip: Secondary | ICD-10-CM

## 2024-08-23 DIAGNOSIS — R0981 Nasal congestion: Secondary | ICD-10-CM

## 2024-08-23 DIAGNOSIS — K1379 Other lesions of oral mucosa: Secondary | ICD-10-CM

## 2024-08-23 DIAGNOSIS — H6123 Impacted cerumen, bilateral: Secondary | ICD-10-CM | POA: Diagnosis not present

## 2024-08-23 MED ORDER — LIDOCAINE VISCOUS HCL 2 % MT SOLN: 5.0000 mL | Freq: Three times a day (TID) | OROMUCOSAL | 0 refills | Status: AC

## 2024-08-23 MED ORDER — LEVOCETIRIZINE DIHYDROCHLORIDE 5 MG PO TABS: 5.0000 mg | ORAL_TABLET | Freq: Every evening | ORAL | 3 refills | Status: AC

## 2024-08-23 MED ORDER — FLUTICASONE PROPIONATE 50 MCG/ACT NA SUSP
2.0000 | Freq: Two times a day (BID) | NASAL | 6 refills | Status: AC
Start: 2024-08-23 — End: ?

## 2024-08-23 NOTE — Progress Notes (Signed)
 ENT CONSULT:  Reason for Consult: oral lesion hard palate and facial pain History of Present Illness Stephanie Gibson is a 58 year old female who presents with painful oral lesions. She is accompanied by her husband. She was referred for evaluation of oral lesion.  She has a hx of torus palatinus, who presents with non-healing ulcer over the roof of torus palatinus and left facial pain.   She has been experiencing painful lesions on the roof of her mouth for approximately one month. Initially, the pain was significant, particularly at night, but has since improved. The pain was described as 'strong' and was initially accompanied by a small bump that became inflamed over the roof of her torus palatinus. She notes a bony bump in her mouth that has been present for a long time but recently became inflamed. She is unsure of any specific irritants that may have caused this change, such as hot food.  She reports frequent earwax buildup, stating that her ears often feel 'blocked'.  No nasal congestion or allergies, except when she has a cold. She has left sided facial pain along her lateral nose and cheek.   Records Reviewed:  PCP office visit - seen for oral lesion 08/11/24  otolaryngologist referral - STAT, 3 w two new oral lesion to hard palate, possibly viral vs cyst vs malignancy No hx of smoking Absence of obvious infectious signs. - Referral to ENT specialist STAT for further evaluation and management as concerning mouth lesions to hard palate consider biopsy     Past Medical History:  Diagnosis Date   Anemia    Anxiety    Arthritis    Asthma    Ectopic pregnancy    Essential hypertension    Heart murmur    Leiomyoma of uterus    Pneumonia    Shortness of breath dyspnea     Past Surgical History:  Procedure Laterality Date   BRONCHIAL WASHINGS  09/01/2022   Procedure: BRONCHIAL WASHINGS;  Surgeon: Alaine Vicenta NOVAK, MD;  Location: [facility] ENDOSCOPY;  Service: Cardiopulmonary;;    CESAREAN SECTION     CYSTOSCOPY N/A 10/21/2015   Procedure: PHYLLIS;  Surgeon: Curlee VEAR Guan, MD;  Location: [facility] ORS;  Service: Gynecology;  Laterality: N/A;   LAPAROSCOPIC VAGINAL HYSTERECTOMY WITH SALPINGO OOPHORECTOMY N/A 10/21/2015   Procedure: LAPAROSCOPIC ASSISTED VAGINAL HYSTERECTOMY;  Surgeon: Curlee VEAR Guan, MD;  Location: [facility] ORS;  Service: Gynecology;  Laterality: N/A;   OOPHORECTOMY     SALPINGOOPHORECTOMY Left 10/21/2015   Procedure: SALPINGO OOPHORECTOMY;  Surgeon: Curlee VEAR Guan, MD;  Location: [facility] ORS;  Service: Gynecology;  Laterality: Left;   TUBAL LIGATION     VIDEO BRONCHOSCOPY N/A 09/01/2022   Procedure: VIDEO BRONCHOSCOPY WITHOUT FLUORO;  Surgeon: Alaine Vicenta NOVAK, MD;  Location: [facility] ENDOSCOPY;  Service: Cardiopulmonary;  Laterality: N/A;    Family History  Problem Relation Age of Onset   Hypertension Mother    Arthritis Mother    Other Mother        fatty liver   Esophageal cancer Father    Throat cancer Father    Hypertension Sister    Thyroid disease Sister    Hypertension Brother    Bladder Cancer Maternal Aunt    Uterine cancer Maternal Aunt    Stomach cancer Maternal Aunt    Colon cancer Neg Hx    Rectal cancer Neg Hx     Social History:  reports that she has never smoked. She has never been exposed to tobacco smoke.  She has never used smokeless tobacco. She reports that she does not currently use alcohol. She reports that she does not use drugs.  Allergies: No Known Allergies  Medications: I have reviewed the patient's current medications.  The PMH, PSH, Medications, Allergies, and SH were reviewed and updated.  ROS: Constitutional: Negative for fever, weight loss and weight gain. Cardiovascular: Negative for chest pain and dyspnea on exertion. Respiratory: Is not experiencing shortness of breath at rest. Gastrointestinal: Negative for nausea and vomiting. Neurological: Negative for headaches. Psychiatric: The patient is not  nervous/anxious  Blood pressure (!) 146/76, pulse 64, last menstrual period 08/10/2015, SpO2 94%.  PHYSICAL EXAM:  Exam: General: Well-developed, well-nourished Respiratory Respiratory effort: Equal inspiration and expiration without stridor Cardiovascular Peripheral Vascular: Warm extremities with equal color/perfusion Eyes: No nystagmus with equal extraocular motion bilaterally Neuro/Psych/Balance: Patient oriented to person, place, and time; Appropriate mood and affect; Gait is intact with no imbalance; Cranial nerves I-XII are intact Head and Face Inspection: Normocephalic and atraumatic without mass or lesion Palpation: Facial skeleton intact without bony stepoffs Salivary Glands: No mass or tenderness Facial Strength: Facial motility symmetric and full bilaterally ENT Pinna: External ear intact and fully developed External canal: Canal is patent with intact skin Tympanic Membrane: Clear and mobile External Nose: No scar or anatomic deformity Internal Nose: Septum intact and midline. No edema, polyp, or rhinorrhea Lips, Teeth, and gums: Mucosa and teeth intact and viable TMJ: No pain to palpation with full mobility Oral cavity/oropharynx: No erythema or exudate, no lesions present Neck Neck and Trachea: Midline trachea without mass or lesion Thyroid: No mass or nodularity Lymphatics: No lymphadenopathy   Assessment/Plan: Encounter Diagnoses  Name Primary?   Lesion of hard palate Yes   Facial pain    Chronic nasal congestion    Environmental and seasonal allergies    Post-nasal drip    Bilateral impacted cerumen     Assessment and Plan Assessment & Plan Palatal oral lesion with focal pain and bony prominence of torus palatinus Lesion on the palate with focal pain and bony prominence, present for one month. Initial severe pain improved.. Differential includes irritation from external factors vs mucosal changes such as dysplasia or lichen planus or other inflammatory  process. Pain radiates upon palpation, atypical for simple bony prominence. Imaging needed to evaluate. - Order imaging to evaluate bony prominence and source of pain. CT max/face - Prescribed magic mouthwash for discomfort and healing. - Provide phone number for [facility] to schedule imaging.   Impacted cerumen, bilateral Frequent earwax buildup causing blockage sensation. Some earwax present but not fully obstructing ear canals. - Recommend over-the-counter Debrox ear drops. - Advised that we will do ear cleaning when she returns .  Suspected environmental allergies She reports left facial pain, unclear if this is related to her oral lesion. Will do empiric antihistamine and Flonase , and evaluate for sinus disease on CT. We will do nasal endoscopy when she returns  - Xyzal 5 mg daily and Flonase  BID - will do nasal endoscopy when she returns     Thank you for allowing me to participate in the care of this patient. Please do not hesitate to contact me with any questions or concerns.   Elena Larry, MD Otolaryngology [facility] Phone: [phone] Fax: [phone]    08/23/2024, 9:59 AM

## 2024-08-25 ENCOUNTER — Ambulatory Visit (HOSPITAL_BASED_OUTPATIENT_CLINIC_OR_DEPARTMENT_OTHER)

## 2024-08-31 ENCOUNTER — Ambulatory Visit (HOSPITAL_BASED_OUTPATIENT_CLINIC_OR_DEPARTMENT_OTHER)

## 2024-10-11 ENCOUNTER — Ambulatory Visit: Admitting: Nurse Practitioner

## 2024-10-11 ENCOUNTER — Encounter: Payer: Self-pay | Admitting: Nurse Practitioner

## 2024-10-11 VITALS — BP 135/75 | HR 81 | Temp 98.4°F | Ht 64.0 in | Wt 188.8 lb

## 2024-10-11 DIAGNOSIS — J4540 Moderate persistent asthma, uncomplicated: Secondary | ICD-10-CM

## 2024-10-11 DIAGNOSIS — J309 Allergic rhinitis, unspecified: Secondary | ICD-10-CM

## 2024-10-11 MED ORDER — MONTELUKAST SODIUM 10 MG PO TABS: 10.0000 mg | ORAL_TABLET | Freq: Every day | ORAL | 11 refills | Status: AC

## 2024-10-11 MED ORDER — FLUTICASONE-SALMETEROL 100-50 MCG/ACT IN AEPB: 1.0000 | INHALATION_SPRAY | Freq: Two times a day (BID) | RESPIRATORY_TRACT | 11 refills | Status: AC

## 2024-10-11 NOTE — Assessment & Plan Note (Signed)
 Continue aggressive management

## 2024-10-11 NOTE — Patient Instructions (Addendum)
 Continue Albuterol  inhaler 2 puffs every 6 hours as needed for shortness of breath or wheezing. Notify if symptoms persist despite rescue inhaler/neb use.  Continue protonix  40 mg daily  Continue Wixela 1 puff Twice daily. Brush tongue and rinse mouth afterwards Continue Mucinex  600 mg to Twice daily for chest congestion/cough Continue delsym 2 tsp Twice daily as needed for cough  Continue Tessalon  perles (benzonatate ) 1 capsule Three times a day as needed for cough    Follow up in 6 months with any new MD to establish care (30 min slot). If symptoms do not improve or worsen, please contact office for sooner follow up or seek emergency care.        Contine con el inhalador de albuterol , 2 inhalaciones cada 6 horas segn sea necesario para la dificultad para respirar o sibilancias. Notifique si los sntomas persisten a pesar del uso del armed forces operational officer de agricultural engineer. Contine con Protonix  40 mg al da. Contine con Wixela 1 inhalacin dos veces al [employer]. Cepllese la lengua y enjuguese la boca despus. Contine con Mucinex  600 mg dos veces al da para la congestin del pecho/tos. Contine con Delsym 2 cucharaditas dos veces al da segn sea necesario para la tos. Contine con Tessalon  perles (benzonatato) 1 cpsula tres veces al da segn sea necesario para la tos.   Consulte con un nuevo mdico en 6 meses para establecer la atencin (intervalo de 30 minutos). Si los sntomas no mejoran o empeoran, comunquese con el consultorio para un seguimiento ms rpido o busque atencin de associate professor.

## 2024-10-11 NOTE — Assessment & Plan Note (Signed)
 Moderate asthma without recent exacerbations. No hospitalizations. No recurrent episodes of hemoptysis. She is compliant with ICS/LABA. Decreased SABA use. Will hold off on addition of LAMA due to cost and improved symptoms. Encouraged to continue trigger prevention therapies. Action plan in place.  Patient Instructions  Continue Albuterol  inhaler 2 puffs every 6 hours as needed for shortness of breath or wheezing. Notify if symptoms persist despite rescue inhaler/neb use.  Continue protonix  40 mg daily  Continue Wixela 1 puff Twice daily. Brush tongue and rinse mouth afterwards Continue Mucinex  600 mg to Twice daily for chest congestion/cough Continue delsym 2 tsp Twice daily as needed for cough  Continue Tessalon  perles (benzonatate ) 1 capsule Three times a day as needed for cough    Follow up in 6 months with any new MD to establish care (30 min slot). If symptoms do not improve or worsen, please contact office for sooner follow up or seek emergency care.        Contine con el inhalador de albuterol , 2 inhalaciones cada 6 horas segn sea necesario para la dificultad para respirar o sibilancias. Notifique si los sntomas persisten a pesar del uso del armed forces operational officer de agricultural engineer. Contine con Protonix  40 mg al da. Contine con Wixela 1 inhalacin dos veces al c[employer] [place] worldwide. Cepllese la lengua y enjuguese la boca despus. Contine con Mucinex  600 mg dos veces al da para la congestin del pecho/tos. Contine con Delsym 2 cucharaditas dos veces al da segn sea necesario para la tos. Contine con Tessalon  perles (benzonatato) 1 cpsula tres veces al da segn sea necesario para la tos.   Consulte con un nuevo mdico en 6 meses para establecer la atencin (intervalo de 30 minutos). Si los sntomas no mejoran o empeoran, comunquese con el consultorio para un seguimiento ms rpido o busque atencin de associate professor.

## 2024-10-11 NOTE — Progress Notes (Signed)
 @Patient  ID: Stephanie Gibson, female    DOB: 02/23/1966, 58 y.o.   MRN: [id number]  Chief Complaint  Patient presents with   Asthma    Follow up    Referring provider: Hinojosa-Clapp, Marcela*  HPI: 58 year old female, never smoker followed for asthma, chronic bronchitis.  She is a former patient of Dr. Spurgeon and last seen in office 07/12/2024 by Malachy NP.  Past medical history significant for hypertension, osteoarthritis, seasonal allergies.  TEST/EVENTS:  07/09/2022: Eos 0, IgE normal 07/09/2022: Sputum culture with heavy growth of haemophilus parainfluenzae 07/09/2022: Lungs are clear 08/05/2022 CT chest with contrast: No LAD.  Trachea and mainstem bronchi are patent.  There is a subtle 5 mm density in the right lung which is stable and compatible with benign finding.  3 mm nodular density in the left lower lobe which is also stable and considered benign.  No acute process identified. 08/08/2022 CTA chest: No evidence of PE.  Minimal bibasilar atelectasis.  No focal airspace opacity to indicate pneumonia.  4 mm nodule in the right middle lobe is unchanged and indicates benign etiology. 09/01/2022 bronchoscopy: negative cultures; cytology negative for malignancy   11/12/2022: OV with Dr. McQuaid. Doing fairly well until last night. Not using Wixela. Still using albuterol  3-10 times a week. New onset bodyaches, sinus congestion, sore throat. Treated with tamiflu . Advised to start Wixela twice daily, regardless of symptoms. Manage sinus symptoms with flonase .  07/12/2024: OV with Lavanda Nevels NP Patient presents today for overdue follow up. She is with a friend, who interprets for her. Declined medical interpretor.  She tells me she is feeling much better compared to last time she was here. She still gets short winded with walking or exertion. When she gets sick, she has a lot of phlegm production and short windedness. Otherwise, the cough and mucus issues have resolved. Her sinuses feel stable. She is  using Wixela twice daily. Still using albuterol  usually once a day. No current wheezing, chest tightness, fevers, night sweats, hemoptysis.   10/11/2024: Today - follow up Discussed the use of AI scribe software for clinical note transcription with the patient, who gave verbal consent to proceed.  History of Present Illness Stephanie Gibson is a 58 year old female with asthma who presents for follow-up of her respiratory symptoms. She is accompanied by her husband and interpretor.   Her breathing has been improved since the last visit. She was prescribed Incruse to add to her current regimen, but she did not purchase it due to cost. It was initially added due to frequent albuterol  use and persistent dyspnea, which has since resolved. She continues to use Wixela and manages well with it.   Previously, she was using her albuterol  rescue inhaler daily, but now she rarely uses it, only when she feels very fatigued. She has not used it within the last month. She denies any wheezing or chest tightness. No increased cough; occasional minimal sputum production with white phlegm, which is baseline. She does have some mild congestion due to the rainy weather but otherwise, feels okay. She uses nasal sprays to help combat this. She also takes Montelukast  daily, which she finds helpful. No PND.     No Known Allergies  Immunization History  Administered Date(s) Administered   Fluzone Influenza virus vaccine,trivalent (IIV3), split virus 10/15/2017   Influenza Split 10/09/2015   Influenza, Seasonal, Injecte, Preservative Fre 08/11/2024   Influenza,inj,Quad PF,6+ Mos 10/09/2015, 10/15/2017, 01/16/2022   Influenza-Unspecified 08/20/2022   PFIZER(Purple Top)SARS-COV-2 Vaccination 01/19/2020,  02/11/2020   Tdap 11/09/2023    Past Medical History:  Diagnosis Date   Anemia    Anxiety    Arthritis    Asthma    Ectopic pregnancy    Essential hypertension    Heart murmur    Leiomyoma of uterus     Pneumonia    Shortness of breath dyspnea     Tobacco History: Social History   Tobacco Use  Smoking Status Never   Passive exposure: Never  Smokeless Tobacco Never   Counseling given: Not Answered   Outpatient Medications Prior to Visit  Medication Sig Dispense Refill   albuterol  (VENTOLIN  HFA) 108 (90 Base) MCG/ACT inhaler Inhale 1-2 puffs into the lungs every 6 (six) hours as needed. 18 g 3   amLODipine  (NORVASC ) 5 MG tablet Take 5 mg by mouth daily.     benzonatate  (TESSALON ) 200 MG capsule Take 1 capsule (200 mg total) by mouth 3 (three) times daily as needed for cough. 30 capsule 1   estradiol  (ESTRACE ) 0.01 % CREA vaginal cream INSERT 1 APPLICATION TWICE WEEK FOR 4 WEEKS, THEN ONLY ONCE WEEKLY     fluticasone  (FLONASE ) 50 MCG/ACT nasal spray Place 2 sprays into both nostrils daily. 18.2 mL 2   fluticasone  (FLONASE ) 50 MCG/ACT nasal spray Place 2 sprays into both nostrils 2 (two) times daily. 16 g 6   hydrOXYzine  (VISTARIL ) 100 MG capsule Take 1 capsule (100 mg total) by mouth at bedtime. 30 capsule 1   ipratropium (ATROVENT ) 0.06 % nasal spray Place 2 sprays into both nostrils 4 (four) times daily. 15 mL 12   levocetirizine (XYZAL  ALLERGY 24HR) 5 MG tablet Take 1 tablet (5 mg total) by mouth every evening. 30 tablet 3   lisinopril-hydrochlorothiazide  (ZESTORETIC) 20-25 MG tablet Take 1 tablet by mouth daily.     magic mouthwash (lidocaine , diphenhydrAMINE , alum & mag hydroxide) suspension Swish and spit 5 mLs 3 (three) times daily. 360 mL 0   metFORMIN (GLUCOPHAGE-XR) 750 MG 24 hr tablet TAKE 1 TABLET EVERY DAY BY ORAL ROUTE WITH MEAL(S) FOR 90 DAYS.     Replens Vaginal Moisturizer GEL Insert 1 g every day by vaginal route.     fluticasone -salmeterol (WIXELA INHUB) 100-50 MCG/ACT AEPB Inhale 1 puff into the lungs 2 (two) times daily. 60 each 5   montelukast (SINGULAIR) 10 MG tablet Take 10 mg by mouth daily.     umeclidinium bromide  (INCRUSE ELLIPTA ) 62.5 MCG/ACT AEPB Inhale 1  puff into the lungs daily. 30 each 11   No facility-administered medications prior to visit.     Review of Systems: As above    Physical Exam:  BP 135/75   Pulse 81   Temp 98.4 F (36.9 C) (Oral)   Ht 5' 4 (1.626 m)   Wt 188 lb 12.8 oz (85.6 kg)   LMP 08/10/2015 (Approximate) Comment: continuous bleeding/spotting for months  SpO2 94%   BMI 32.41 kg/m   GEN: Pleasant, interactive, well-appearing; obese; in no acute distress. HEENT:  Normocephalic and atraumatic. PERRLA. Sclera white. Nasal turbinates erythematous, moist and patent bilaterally. No rhinorrhea present. Oropharynx pink and moist, without exudate or edema. No lesions, ulcerations NECK:  Supple w/ fair ROM.No lymphadenopathy.   CV: RRR, no m/r/g PULMONARY:  Unlabored, regular breathing. Clear bilaterally A&P w/o wheezes/rales/rhonchi. No accessory muscle use.  GI: BS present and normoactive. Soft, non-tender to palpation.  MSK: No erythema, warmth or tenderness. No deformities or joint swelling noted.  Neuro: A/Ox3. No focal deficits noted.  Skin: Warm, no lesions or rashe Psych: Normal affect and behavior. Judgement and thought content appropriate.     Lab Results:  CBC    Component Value Date/Time   WBC 8.3 08/08/2022 1617   RBC 5.00 08/08/2022 1617   HGB 14.2 08/08/2022 1617   HCT 43.1 08/08/2022 1617   PLT 188 08/08/2022 1617   MCV 86.2 08/08/2022 1617   MCH 28.4 08/08/2022 1617   MCHC 32.9 08/08/2022 1617   RDW 14.4 08/08/2022 1617   LYMPHSABS 2.2 08/08/2022 1617   MONOABS 0.6 08/08/2022 1617   EOSABS 0.0 08/08/2022 1617   BASOSABS 0.1 08/08/2022 1617    BMET    Component Value Date/Time   NA 140 08/08/2022 1617   NA 140 03/25/2020 1027   K 3.3 (L) 08/08/2022 1617   CL 106 08/08/2022 1617   CO2 24 08/08/2022 1617   GLUCOSE 117 (H) 08/08/2022 1617   BUN 19 08/08/2022 1617   BUN 14 03/25/2020 1027   CREATININE 0.82 08/08/2022 1617   CREATININE 0.64 06/25/2016 0826   CALCIUM 9.3  08/08/2022 1617   GFRNONAA >60 08/08/2022 1617   GFRAA 114 03/25/2020 1027    BNP No results found for: BNP   Imaging:  No results found.  Administration History     None          Latest Ref Rng & Units 09/24/2022   10:56 AM  PFT Results  FVC-Pre L 3.12   FVC-Predicted Pre % 94   FVC-Post L 2.98   FVC-Predicted Post % 89   Pre FEV1/FVC % % 47   Post FEV1/FCV % % 86   FEV1-Pre L 1.48   FEV1-Predicted Pre % 57   FEV1-Post L 2.56   DLCO uncorrected ml/min/mmHg 20.86   DLCO UNC% % 103   DLCO corrected ml/min/mmHg 20.38   DLCO COR %Predicted % 101   DLVA Predicted % 110   TLC L 4.93   TLC % Predicted % 98   RV % Predicted % 97     No results found for: NITRICOXIDE      Assessment & Plan:   Moderate persistent asthma Moderate asthma without recent exacerbations. No hospitalizations. No recurrent episodes of hemoptysis. She is compliant with ICS/LABA. Decreased SABA use. Will hold off on addition of LAMA due to cost and improved symptoms. Encouraged to continue trigger prevention therapies. Action plan in place.  Patient Instructions  Continue Albuterol  inhaler 2 puffs every 6 hours as needed for shortness of breath or wheezing. Notify if symptoms persist despite rescue inhaler/neb use.  Continue protonix  40 mg daily  Continue Wixela 1 puff Twice daily. Brush tongue and rinse mouth afterwards Continue Mucinex  600 mg to Twice daily for chest congestion/cough Continue delsym 2 tsp Twice daily as needed for cough  Continue Tessalon  perles (benzonatate ) 1 capsule Three times a day as needed for cough    Follow up in 6 months with any new MD to establish care (30 min slot). If symptoms do not improve or worsen, please contact office for sooner follow up or seek emergency care.        Contine con el inhalador de albuterol , 2 inhalaciones cada 6 horas segn sea necesario para la dificultad para respirar o sibilancias. Notifique si los sntomas persisten a  pesar del uso del armed forces operational officer de agricultural engineer. Contine con Protonix  40 mg al da. Contine con Wixela 1 inhalacin dos veces al c[employer]. Cepllese la lengua y enjuguese la boca despus. Contine con Mucinex  600 mg  dos veces al da para la congestin del pecho/tos. Contine con Delsym 2 cucharaditas dos veces al da segn sea necesario para la tos. Contine con Tessalon  perles (benzonatato) 1 cpsula tres veces al da segn sea necesario para la tos.   Consulte con un nuevo mdico en 6 meses para establecer la atencin (intervalo de 30 minutos). Si los sntomas no mejoran o empeoran, comunquese con el consultorio para un seguimiento ms rpido o busque atencin de associate professor.   Allergic rhinitis Continue aggressive management     I spent 25 minutes of dedicated to the care of this patient on the date of this encounter to include pre-visit review of records, face-to-face time with the patient discussing conditions above, post visit ordering of testing, clinical documentation with the electronic health record, making appropriate referrals as documented, and communicating necessary findings to members of the patients care team.  Comer LULLA Rouleau, NP 10/11/2024  Pt aware and understands NP's role.

## 2024-10-15 ENCOUNTER — Ambulatory Visit (INDEPENDENT_AMBULATORY_CARE_PROVIDER_SITE_OTHER)

## 2024-10-15 ENCOUNTER — Telehealth (INDEPENDENT_AMBULATORY_CARE_PROVIDER_SITE_OTHER): Payer: Self-pay

## 2024-10-15 NOTE — Telephone Encounter (Signed)
 Interpreter [phone] assisted me in a phone call to patient to let her know that we had to reschedule her appointment to after she gets her CT done that was ordered.

## 2024-10-25 ENCOUNTER — Ambulatory Visit (INDEPENDENT_AMBULATORY_CARE_PROVIDER_SITE_OTHER): Admitting: Otolaryngology

## 2025-06-17 ENCOUNTER — Ambulatory Visit: Admitting: Family Medicine
